# Patient Record
Sex: Female | Born: 1980 | Race: Black or African American | Hispanic: No | Marital: Married | Smoking: Never smoker
Health system: Southern US, Community
[De-identification: ages and names within clinical notes are randomized; demographics above are authoritative.]

## PROBLEM LIST (undated history)

## (undated) DIAGNOSIS — K5010 Crohn's disease of large intestine without complications: Secondary | ICD-10-CM

## (undated) DIAGNOSIS — G43909 Migraine, unspecified, not intractable, without status migrainosus: Secondary | ICD-10-CM

## (undated) DIAGNOSIS — D500 Iron deficiency anemia secondary to blood loss (chronic): Secondary | ICD-10-CM

## (undated) DIAGNOSIS — R7303 Prediabetes: Secondary | ICD-10-CM

## (undated) DIAGNOSIS — F419 Anxiety disorder, unspecified: Secondary | ICD-10-CM

## (undated) DIAGNOSIS — K529 Noninfective gastroenteritis and colitis, unspecified: Secondary | ICD-10-CM

## (undated) DIAGNOSIS — A498 Other bacterial infections of unspecified site: Secondary | ICD-10-CM

## (undated) DIAGNOSIS — M797 Fibromyalgia: Secondary | ICD-10-CM

## (undated) DIAGNOSIS — K219 Gastro-esophageal reflux disease without esophagitis: Secondary | ICD-10-CM

## (undated) DIAGNOSIS — J189 Pneumonia, unspecified organism: Secondary | ICD-10-CM

## (undated) DIAGNOSIS — G932 Benign intracranial hypertension: Secondary | ICD-10-CM

## (undated) HISTORY — DX: Other bacterial infections of unspecified site: A49.8

## (undated) HISTORY — DX: Anxiety disorder, unspecified: F41.9

## (undated) HISTORY — PX: EXCISIONAL HEMORRHOIDECTOMY: SHX1541

## (undated) HISTORY — DX: Crohn's disease of large intestine without complications: K50.10

## (undated) HISTORY — PX: LUMBAR PUNCTURE: SHX1985

## (undated) HISTORY — DX: Iron deficiency anemia secondary to blood loss (chronic): D50.0

## (undated) HISTORY — DX: Noninfective gastroenteritis and colitis, unspecified: K52.9

## (undated) HISTORY — DX: Prediabetes: R73.03

---

## 2000-12-15 ENCOUNTER — Emergency Department (HOSPITAL_COMMUNITY): Admission: EM | Admit: 2000-12-15 | Discharge: 2000-12-15 | Payer: Self-pay | Admitting: Emergency Medicine

## 2001-10-26 ENCOUNTER — Emergency Department (HOSPITAL_COMMUNITY): Admission: EM | Admit: 2001-10-26 | Discharge: 2001-10-26 | Payer: Self-pay | Admitting: Emergency Medicine

## 2009-12-01 ENCOUNTER — Emergency Department (HOSPITAL_BASED_OUTPATIENT_CLINIC_OR_DEPARTMENT_OTHER): Admission: EM | Admit: 2009-12-01 | Discharge: 2009-12-02 | Payer: Self-pay | Admitting: Emergency Medicine

## 2009-12-20 ENCOUNTER — Emergency Department (HOSPITAL_BASED_OUTPATIENT_CLINIC_OR_DEPARTMENT_OTHER): Admission: EM | Admit: 2009-12-20 | Discharge: 2009-12-20 | Payer: Self-pay | Admitting: Emergency Medicine

## 2010-01-06 ENCOUNTER — Emergency Department (HOSPITAL_BASED_OUTPATIENT_CLINIC_OR_DEPARTMENT_OTHER): Admission: EM | Admit: 2010-01-06 | Discharge: 2010-01-06 | Payer: Self-pay | Admitting: Emergency Medicine

## 2010-01-08 ENCOUNTER — Emergency Department (HOSPITAL_BASED_OUTPATIENT_CLINIC_OR_DEPARTMENT_OTHER): Admission: EM | Admit: 2010-01-08 | Discharge: 2010-01-08 | Payer: Self-pay | Admitting: Emergency Medicine

## 2010-03-18 ENCOUNTER — Emergency Department (HOSPITAL_BASED_OUTPATIENT_CLINIC_OR_DEPARTMENT_OTHER): Admission: EM | Admit: 2010-03-18 | Discharge: 2010-03-18 | Payer: Self-pay | Admitting: Emergency Medicine

## 2010-06-10 ENCOUNTER — Emergency Department (HOSPITAL_BASED_OUTPATIENT_CLINIC_OR_DEPARTMENT_OTHER): Admission: EM | Admit: 2010-06-10 | Discharge: 2010-06-10 | Payer: Self-pay | Admitting: Emergency Medicine

## 2010-09-27 ENCOUNTER — Emergency Department (HOSPITAL_BASED_OUTPATIENT_CLINIC_OR_DEPARTMENT_OTHER): Admission: EM | Admit: 2010-09-27 | Discharge: 2010-09-27 | Payer: Self-pay | Admitting: Emergency Medicine

## 2010-10-25 ENCOUNTER — Emergency Department (HOSPITAL_BASED_OUTPATIENT_CLINIC_OR_DEPARTMENT_OTHER): Admission: EM | Admit: 2010-10-25 | Discharge: 2009-12-04 | Payer: Self-pay | Admitting: Emergency Medicine

## 2011-01-29 LAB — URINALYSIS, ROUTINE W REFLEX MICROSCOPIC
Bilirubin Urine: NEGATIVE
Ketones, ur: NEGATIVE mg/dL
Leukocytes, UA: NEGATIVE
Nitrite: NEGATIVE
Protein, ur: NEGATIVE mg/dL
Urobilinogen, UA: 1 mg/dL (ref 0.0–1.0)
pH: 6 (ref 5.0–8.0)

## 2011-01-29 LAB — PREGNANCY, URINE: Preg Test, Ur: NEGATIVE

## 2011-01-31 ENCOUNTER — Emergency Department (INDEPENDENT_AMBULATORY_CARE_PROVIDER_SITE_OTHER): Payer: Self-pay

## 2011-01-31 ENCOUNTER — Emergency Department (HOSPITAL_BASED_OUTPATIENT_CLINIC_OR_DEPARTMENT_OTHER)
Admission: EM | Admit: 2011-01-31 | Discharge: 2011-01-31 | Disposition: A | Discharge status: Home / Self Care | Payer: Self-pay | Attending: Emergency Medicine | Admitting: Emergency Medicine

## 2011-01-31 DIAGNOSIS — R209 Unspecified disturbances of skin sensation: Secondary | ICD-10-CM

## 2011-01-31 DIAGNOSIS — J329 Chronic sinusitis, unspecified: Secondary | ICD-10-CM | POA: Insufficient documentation

## 2011-01-31 DIAGNOSIS — G5790 Unspecified mononeuropathy of unspecified lower limb: Secondary | ICD-10-CM | POA: Insufficient documentation

## 2011-01-31 DIAGNOSIS — D649 Anemia, unspecified: Secondary | ICD-10-CM | POA: Insufficient documentation

## 2011-01-31 LAB — URINALYSIS, ROUTINE W REFLEX MICROSCOPIC
Bilirubin Urine: NEGATIVE
Glucose, UA: NEGATIVE mg/dL
Ketones, ur: NEGATIVE mg/dL
Nitrite: NEGATIVE
Protein, ur: NEGATIVE mg/dL
pH: 7 (ref 5.0–8.0)

## 2011-01-31 LAB — DIFFERENTIAL
Basophils Relative: 0 % (ref 0–1)
Eosinophils Absolute: 0.3 10*3/uL (ref 0.0–0.7)
Eosinophils Relative: 3 % (ref 0–5)
Lymphs Abs: 1.9 10*3/uL (ref 0.7–4.0)
Monocytes Absolute: 0.5 10*3/uL (ref 0.1–1.0)
Monocytes Relative: 6 % (ref 3–12)
Neutrophils Relative %: 69 % (ref 43–77)

## 2011-01-31 LAB — BASIC METABOLIC PANEL
BUN: 8 mg/dL (ref 6–23)
Calcium: 9.2 mg/dL (ref 8.4–10.5)
Chloride: 112 mEq/L (ref 96–112)
Creatinine, Ser: 0.9 mg/dL (ref 0.4–1.2)
GFR calc Af Amer: >60 mL/min (ref >60)

## 2011-01-31 LAB — CBC
MCH: 24.8 pg — ABNORMAL LOW (ref 26.0–34.0)
MCHC: 33.1 g/dL (ref 30.0–36.0)
MCV: 74.8 fL — ABNORMAL LOW (ref 78.0–100.0)
Platelets: 297 10*3/uL (ref 150–400)
RBC: 4.48 MIL/uL (ref 3.87–5.11)

## 2011-01-31 LAB — PREGNANCY, URINE: Preg Test, Ur: NEGATIVE

## 2011-02-03 LAB — PREGNANCY, URINE: Preg Test, Ur: NEGATIVE

## 2011-04-11 ENCOUNTER — Emergency Department (HOSPITAL_BASED_OUTPATIENT_CLINIC_OR_DEPARTMENT_OTHER)
Admission: EM | Admit: 2011-04-11 | Discharge: 2011-04-11 | Disposition: A | Discharge status: Home / Self Care | Payer: Self-pay | Attending: Emergency Medicine | Admitting: Emergency Medicine

## 2011-04-11 DIAGNOSIS — G5790 Unspecified mononeuropathy of unspecified lower limb: Secondary | ICD-10-CM | POA: Insufficient documentation

## 2015-08-16 DIAGNOSIS — R531 Weakness: Secondary | ICD-10-CM | POA: Insufficient documentation

## 2015-08-16 DIAGNOSIS — M79601 Pain in right arm: Secondary | ICD-10-CM | POA: Insufficient documentation

## 2015-08-16 DIAGNOSIS — R200 Anesthesia of skin: Secondary | ICD-10-CM | POA: Insufficient documentation

## 2015-08-16 DIAGNOSIS — G894 Chronic pain syndrome: Secondary | ICD-10-CM | POA: Insufficient documentation

## 2015-09-13 DIAGNOSIS — R209 Unspecified disturbances of skin sensation: Secondary | ICD-10-CM | POA: Insufficient documentation

## 2015-10-05 DIAGNOSIS — H9319 Tinnitus, unspecified ear: Secondary | ICD-10-CM | POA: Insufficient documentation

## 2015-10-05 DIAGNOSIS — G932 Benign intracranial hypertension: Secondary | ICD-10-CM | POA: Insufficient documentation

## 2016-02-27 DIAGNOSIS — G501 Atypical facial pain: Secondary | ICD-10-CM | POA: Insufficient documentation

## 2016-02-27 DIAGNOSIS — M5481 Occipital neuralgia: Secondary | ICD-10-CM | POA: Insufficient documentation

## 2016-02-27 DIAGNOSIS — M25559 Pain in unspecified hip: Secondary | ICD-10-CM | POA: Insufficient documentation

## 2016-02-27 DIAGNOSIS — M461 Sacroiliitis, not elsewhere classified: Secondary | ICD-10-CM | POA: Insufficient documentation

## 2016-02-27 DIAGNOSIS — M25549 Pain in joints of unspecified hand: Secondary | ICD-10-CM | POA: Insufficient documentation

## 2016-02-27 DIAGNOSIS — M62838 Other muscle spasm: Secondary | ICD-10-CM | POA: Insufficient documentation

## 2016-02-27 DIAGNOSIS — M5417 Radiculopathy, lumbosacral region: Secondary | ICD-10-CM | POA: Insufficient documentation

## 2016-04-04 DIAGNOSIS — F1120 Opioid dependence, uncomplicated: Secondary | ICD-10-CM | POA: Insufficient documentation

## 2016-07-25 DIAGNOSIS — G4710 Hypersomnia, unspecified: Secondary | ICD-10-CM | POA: Insufficient documentation

## 2016-08-24 DIAGNOSIS — R0683 Snoring: Secondary | ICD-10-CM | POA: Insufficient documentation

## 2016-10-23 DIAGNOSIS — G250 Essential tremor: Secondary | ICD-10-CM | POA: Insufficient documentation

## 2018-01-07 ENCOUNTER — Other Ambulatory Visit (HOSPITAL_COMMUNITY): Payer: Self-pay | Admitting: Physician Assistant

## 2018-01-07 DIAGNOSIS — M549 Dorsalgia, unspecified: Principal | ICD-10-CM

## 2018-01-07 DIAGNOSIS — G8929 Other chronic pain: Secondary | ICD-10-CM

## 2018-01-10 ENCOUNTER — Ambulatory Visit (HOSPITAL_COMMUNITY)
Admission: RE | Admit: 2018-01-10 | Discharge: 2018-01-10 | Disposition: A | Discharge status: Home / Self Care | Payer: BLUE CROSS/BLUE SHIELD | Source: Ambulatory Visit | Attending: Physician Assistant | Admitting: Physician Assistant

## 2018-01-10 ENCOUNTER — Encounter (HOSPITAL_COMMUNITY): Payer: Self-pay

## 2018-01-18 ENCOUNTER — Emergency Department (HOSPITAL_BASED_OUTPATIENT_CLINIC_OR_DEPARTMENT_OTHER): Payer: BLUE CROSS/BLUE SHIELD

## 2018-01-18 ENCOUNTER — Emergency Department (HOSPITAL_BASED_OUTPATIENT_CLINIC_OR_DEPARTMENT_OTHER)
Admission: EM | Admit: 2018-01-18 | Discharge: 2018-01-19 | Disposition: A | Discharge status: Home / Self Care | Payer: BLUE CROSS/BLUE SHIELD | Attending: Emergency Medicine | Admitting: Emergency Medicine

## 2018-01-18 ENCOUNTER — Other Ambulatory Visit: Payer: Self-pay

## 2018-01-18 ENCOUNTER — Encounter (HOSPITAL_BASED_OUTPATIENT_CLINIC_OR_DEPARTMENT_OTHER): Payer: Self-pay | Admitting: Emergency Medicine

## 2018-01-18 DIAGNOSIS — Y92330 Ice skating rink (indoor) (outdoor) as the place of occurrence of the external cause: Secondary | ICD-10-CM | POA: Insufficient documentation

## 2018-01-18 DIAGNOSIS — Y9301 Activity, walking, marching and hiking: Secondary | ICD-10-CM | POA: Insufficient documentation

## 2018-01-18 DIAGNOSIS — R51 Headache: Secondary | ICD-10-CM | POA: Insufficient documentation

## 2018-01-18 DIAGNOSIS — Z79899 Other long term (current) drug therapy: Secondary | ICD-10-CM | POA: Insufficient documentation

## 2018-01-18 DIAGNOSIS — Y998 Other external cause status: Secondary | ICD-10-CM | POA: Insufficient documentation

## 2018-01-18 DIAGNOSIS — M549 Dorsalgia, unspecified: Secondary | ICD-10-CM | POA: Diagnosis not present

## 2018-01-18 DIAGNOSIS — M791 Myalgia, unspecified site: Secondary | ICD-10-CM

## 2018-01-18 DIAGNOSIS — S3992XA Unspecified injury of lower back, initial encounter: Secondary | ICD-10-CM | POA: Diagnosis present

## 2018-01-18 DIAGNOSIS — W19XXXA Unspecified fall, initial encounter: Secondary | ICD-10-CM

## 2018-01-18 HISTORY — DX: Fibromyalgia: M79.7

## 2018-01-18 HISTORY — DX: Gastro-esophageal reflux disease without esophagitis: K21.9

## 2018-01-18 HISTORY — DX: Migraine, unspecified, not intractable, without status migrainosus: G43.909

## 2018-01-18 LAB — PREGNANCY, URINE: Preg Test, Ur: NEGATIVE

## 2018-01-18 MED ORDER — ACETAMINOPHEN 325 MG PO TABS
650.0000 mg | ORAL_TABLET | Freq: Once | ORAL | Status: AC
  Administered 2018-01-18: 650 mg via ORAL
  Filled 2018-01-18: qty 2

## 2018-01-18 MED ORDER — OXYCODONE HCL 5 MG PO TABS
5.0000 mg | ORAL_TABLET | Freq: Once | ORAL | Status: AC
  Administered 2018-01-19: 5 mg via ORAL
  Filled 2018-01-18: qty 1

## 2018-01-18 MED ORDER — IBUPROFEN 400 MG PO TABS
ORAL_TABLET | ORAL | Status: AC
  Filled 2018-01-18: qty 1

## 2018-01-18 MED ORDER — IBUPROFEN 400 MG PO TABS
400.0000 mg | ORAL_TABLET | Freq: Once | ORAL | Status: AC | PRN
  Administered 2018-01-18: 400 mg via ORAL

## 2018-01-18 NOTE — ED Notes (Signed)
ED Provider at bedside. 

## 2018-01-18 NOTE — ED Provider Notes (Signed)
[facility] EMERGENCY DEPARTMENT Provider Note   CSN: [id number] Arrival date & time: 01/18/18  [id number]     History   Chief Complaint Chief Complaint  Patient presents with  . Fall    HPI Nancy Reeves is a 37 y.o. female.  The history is provided by the patient and medical records. No language interpreter was used.  Fall  Associated symptoms include headaches.   Nancy Reeves is a 37 y.o. female  with a PMH of fibromyalgia who presents to the Emergency Department for evaluation after fall around 4PM today. Patient was at the ice rink and walking on carpet floor with her ice skates on when she lost her balance and fell backwards. She fell onto back and struck her head. Endorses back pain and headache. No LOC. Not on anti-coagulation. No nausea, vomiting or visual changes. Ibuprofen given in triage which did help a little with pain. No saddle anesthesia, weakness, numbness, urinary complaints including retention/incontinence. Hx of fibromyalgia, but no history of back issues or previous spinal procedures.   Past Medical History:  Diagnosis Date  . Fibromyalgia   . GERD (gastroesophageal reflux disease)   . Migraine    psuedotumor cerebra    There are no active problems to display for this patient.   History reviewed. No pertinent surgical history.  OB History    No data available       Home Medications    Prior to Admission medications   Medication Sig Start Date End Date Taking? Authorizing Provider  DULoxetine (CYMBALTA) 60 MG capsule Take 60 mg by mouth daily.   Yes [provider]  gabapentin (NEURONTIN) 300 MG capsule Take 300 mg by mouth 3 (three) times daily.   Yes [provider]  methocarbamol (ROBAXIN) 500 MG tablet Take 1 tablet (500 mg total) by mouth 2 (two) times daily as needed (muscle soreness). 01/19/18   Ward, Chase PicketJaime Pilcher, PA-C  naproxen (NAPROSYN) 500 MG tablet Take 1 tablet (500 mg total) by mouth 2 (two) times daily as  needed. 01/19/18   Ward, Chase PicketJaime Pilcher, PA-C    Family History History reviewed. No pertinent family history.  Social History Social History   Tobacco Use  . Smoking status: Never Smoker  . Smokeless tobacco: Never Used  Substance Use Topics  . Alcohol use: No    Frequency: Never  . Drug use: No     Allergies   Triptans   Review of Systems Review of Systems  Musculoskeletal: Positive for arthralgias and myalgias.  Skin: Negative for color change and wound.  Neurological: Positive for headaches. Negative for dizziness, syncope, weakness, light-headedness and numbness.  All other systems reviewed and are negative.    Physical Exam Updated Vital Signs BP 125/87 (BP Location: Right Arm)   Pulse 92   Temp 98.1 F (36.7 C) (Oral)   Resp 18   Ht 5\' 4"  (1.626 m)   Wt 95.7 kg (211 lb)   LMP  (LMP Unknown)   SpO2 100%   BMI 36.22 kg/m   Physical Exam  Constitutional: She is oriented to person, place, and time. She appears well-developed and well-nourished. No distress.  HENT:  Head: Normocephalic and atraumatic.  Cardiovascular: Normal rate, regular rhythm and normal heart sounds.  No murmur heard. Pulmonary/Chest: Effort normal and breath sounds normal. No respiratory distress.  Abdominal: Soft. She exhibits no distension. There is no tenderness.  Musculoskeletal:  Diffuse tenderness along entire back, central and midline C/T/L spine. Full ROM and ambulatory.  Negative straight leg raises bilaterally. She also has tenderness to the right hip without overlying skin changes. Full ROM of the hip, however pain reproducible with internal or external rotation.  Neurological: She is alert and oriented to person, place, and time.  Alert, oriented, thought content appropriate, able to give a coherent history. Speech is clear and goal oriented, able to follow commands.  Cranial Nerves:  II:  Peripheral visual fields grossly normal, pupils equal, round, reactive to light III, IV,  VI: EOM intact bilaterally, ptosis not present V,VII: smile symmetric, eyes kept closed tightly against resistance, facial light touch sensation equal VIII: hearing grossly normal IX, X: symmetric soft palate movement, uvula elevates symmetrically  XI: bilateral shoulder shrug symmetric and strong XII: midline tongue extension 5/5 muscle strength in upper and lower extremities bilaterally including strong and equal grip strength and dorsiflexion/plantar flexion Sensory to light touch normal in all four extremities.  Normal finger-to-nose and rapid alternating movements; normal gait and balance. Negative romberg, no pronator drift.  Skin: Skin is warm and dry.  Nursing note and vitals reviewed.    ED Treatments / Results  Labs (all labs ordered are listed, but only abnormal results are displayed) Labs Reviewed  PREGNANCY, URINE    EKG  EKG Interpretation None       Radiology No results found.  Procedures Procedures (including critical care time)  Medications Ordered in ED Medications  ibuprofen (ADVIL,MOTRIN) tablet 400 mg (400 mg Oral Given 01/18/18 2038)  oxyCODONE (Oxy IR/ROXICODONE) immediate release tablet 5 mg (5 mg Oral Given 01/19/18 0032)  acetaminophen (TYLENOL) tablet 650 mg (650 mg Oral Given 01/18/18 2313)     Initial Impression / Assessment and Plan / ED Course  I have reviewed the triage vital signs and the nursing notes.  Pertinent labs & imaging results that were available during my care of the patient were reviewed by me and considered in my medical decision making (see chart for details).    Nancy Reeves is a 37 y.o. female who presents to ED for evaluation after falling down while ice skating at 4PM today. She did hit head. No focal neuro deficits on exam. Not on anti-coagulation. No LOC, n/v. 0 on [place] CT rule. Do not feel imaging is warranted at this time. She has diffuse tenderness to entire back. More-so to the musculature than midline. Will  obtain plain films for further evaluation. If negative, will treat symptomatically.   X-ray pending at shift change. Care assumed by oncoming provider Dr. Wilkie Aye. Case discussed, plan agreed upon. Will follow up on pending imaging, if negative, symptomatic home care and PCP follow up.    Final Clinical Impressions(s) / ED Diagnoses   Final diagnoses:  Fall, initial encounter  Acute bilateral back pain, unspecified back location  Muscle soreness    ED Discharge Orders        Ordered    naproxen (NAPROSYN) 500 MG tablet  2 times daily PRN     01/19/18 0043    methocarbamol (ROBAXIN) 500 MG tablet  2 times daily PRN     01/19/18 0043       Ward, Chase Picket, PA-C 01/19/18 0057    Tilden Fossa, MD 01/19/18 [phone]

## 2018-01-18 NOTE — ED Triage Notes (Signed)
Patient states that she fell backwards onto her head around 430. The patient denies any LOC

## 2018-01-19 MED ORDER — METHOCARBAMOL 500 MG PO TABS: 500.0000 mg | ORAL_TABLET | Freq: Two times a day (BID) | ORAL | 0 refills | Status: DC | PRN

## 2018-01-19 MED ORDER — NAPROXEN 500 MG PO TABS: 500.0000 mg | ORAL_TABLET | Freq: Two times a day (BID) | ORAL | 0 refills | Status: DC | PRN

## 2018-01-19 NOTE — Discharge Instructions (Signed)
It was my pleasure taking care of you today!   Fortunately, your x-rays were very reassuring and did not show any injury from your fall today.  Robaxin is your muscle relaxer to take as needed. Naproxen as needed for pain. In addition to this, ice or heat to affected area can help with pain relief.   Please follow up with your primary care doctor if symptoms are not improving in the next 2-3 days.   Return to ER for new or worsening symptoms, any additional concerns.   COLD THERAPY DIRECTIONS:  Ice or gel packs can be used to reduce both pain and swelling. Ice is the most helpful within the first 24 to 48 hours after an injury or flareup from overusing a muscle or joint.  Ice is effective, has very few side effects, and is safe for most people to use.   If you expose your skin to cold temperatures for too long or without the proper protection, you can damage your skin or nerves. Watch for signs of skin damage due to cold.   HOME CARE INSTRUCTIONS  Follow these tips to use ice and cold packs safely.  Place a dry or damp towel between the ice and skin. A damp towel will cool the skin more quickly, so you may need to shorten the time that the ice is used.  For a more rapid response, add gentle compression to the ice.  Ice for no more than 10 to 20 minutes at a time. The bonier the area you are icing, the less time it will take to get the benefits of ice.  Check your skin after 5 minutes to make sure there are no signs of a poor response to cold or skin damage.  Rest 20 minutes or more in between uses.  Once your skin is numb, you can end your treatment. You can test numbness by very lightly touching your skin. The touch should be so light that you do not see the skin dimple from the pressure of your fingertip. When using ice, most people will feel these normal sensations in this order: cold, burning, aching, and numbness.

## 2018-08-28 ENCOUNTER — Other Ambulatory Visit: Payer: Self-pay

## 2018-08-28 ENCOUNTER — Emergency Department (HOSPITAL_BASED_OUTPATIENT_CLINIC_OR_DEPARTMENT_OTHER)
Admission: EM | Admit: 2018-08-28 | Discharge: 2018-08-29 | Disposition: A | Discharge status: Home / Self Care | Payer: BLUE CROSS/BLUE SHIELD | Attending: Emergency Medicine | Admitting: Emergency Medicine

## 2018-08-28 ENCOUNTER — Encounter (HOSPITAL_BASED_OUTPATIENT_CLINIC_OR_DEPARTMENT_OTHER): Payer: Self-pay

## 2018-08-28 DIAGNOSIS — M546 Pain in thoracic spine: Secondary | ICD-10-CM | POA: Insufficient documentation

## 2018-08-28 DIAGNOSIS — Z79899 Other long term (current) drug therapy: Secondary | ICD-10-CM | POA: Diagnosis not present

## 2018-08-28 DIAGNOSIS — G8929 Other chronic pain: Secondary | ICD-10-CM | POA: Diagnosis not present

## 2018-08-28 LAB — URINALYSIS, ROUTINE W REFLEX MICROSCOPIC
Bilirubin Urine: NEGATIVE
GLUCOSE, UA: NEGATIVE mg/dL
Ketones, ur: NEGATIVE mg/dL
Leukocytes, UA: NEGATIVE
Nitrite: NEGATIVE
PROTEIN: NEGATIVE mg/dL
Specific Gravity, Urine: >1.03 — ABNORMAL HIGH (ref 1.005–1.030)
pH: 6 (ref 5.0–8.0)

## 2018-08-28 LAB — URINALYSIS, MICROSCOPIC (REFLEX): WBC UA: NONE SEEN WBC/hpf (ref 0–5)

## 2018-08-28 LAB — PREGNANCY, URINE: PREG TEST UR: NEGATIVE

## 2018-08-28 MED ORDER — METHOCARBAMOL 500 MG PO TABS
1000.0000 mg | ORAL_TABLET | Freq: Once | ORAL | Status: AC
  Administered 2018-08-29: 1000 mg via ORAL
  Filled 2018-08-28: qty 2

## 2018-08-28 NOTE — ED Triage Notes (Signed)
C/o mid back spasms-was seen by chiropractor today and twice/week for same-NAD-steady gait

## 2018-08-28 NOTE — ED Notes (Signed)
ED Provider at bedside. 

## 2018-08-29 MED ORDER — METHOCARBAMOL 500 MG PO TABS: 500.0000 mg | ORAL_TABLET | Freq: Two times a day (BID) | ORAL | 0 refills | Status: DC | PRN

## 2018-08-29 NOTE — ED Provider Notes (Signed)
[facility] EMERGENCY DEPARTMENT Provider Note   CSN: [id number] Arrival date & time: 08/28/18  2136     History   Chief Complaint Chief Complaint  Patient presents with  . Back Pain    HPI Nancy Reeves is a 37 y.o. female.  The history is provided by the patient. No language interpreter was used.   Nancy Reeves is a 37 y.o. female who presents to the Emergency Department complaining of back pain. She has a history of fibromyalgia and [place] cerebral. She has been treating herself at home with occasional ibuprofen and BC powders. She presents for evaluation of acute on chronic thoracic back pain. Pain is been present for several years but worsening today. It is described as a squeezing sensation along her rib margin bilaterally and in her back. She denies any fevers, nausea, vomiting, shortness of breath, abdominal pain, leg swelling. She has seen her chiropractor four times for the symptoms with no significant improvement.  Past Medical History:  Diagnosis Date  . Fibromyalgia   . GERD (gastroesophageal reflux disease)   . Migraine    psuedotumor cerebra    There are no active problems to display for this patient.   History reviewed. No pertinent surgical history.   OB History   None      Home Medications    Prior to Admission medications   Medication Sig Start Date End Date Taking? Authorizing Provider  DULoxetine (CYMBALTA) 60 MG capsule Take 60 mg by mouth daily.    [provider]  gabapentin (NEURONTIN) 300 MG capsule Take 300 mg by mouth 3 (three) times daily.    [provider]  methocarbamol (ROBAXIN) 500 MG tablet Take 1-2 tablets (500-1,000 mg total) by mouth 2 (two) times daily as needed for muscle spasms. 08/29/18   Tilden Fossa, MD  naproxen (NAPROSYN) 500 MG tablet Take 1 tablet (500 mg total) by mouth 2 (two) times daily as needed. 01/19/18   Ward, Chase Picket, PA-C    Family History No family history on  file.  Social History Social History   Tobacco Use  . Smoking status: Never Smoker  . Smokeless tobacco: Never Used  Substance Use Topics  . Alcohol use: No    Frequency: Never  . Drug use: No     Allergies   Triptans   Review of Systems Review of Systems  All other systems reviewed and are negative.    Physical Exam Updated Vital Signs BP (!) 144/96 (BP Location: Left Arm)   Pulse (!) 104   Temp 98.5 F (36.9 C) (Oral)   Resp 20   Ht 5\' 4"  (1.626 m)   Wt 107 kg   LMP 08/07/2018 (Approximate)   SpO2 100%   BMI 40.51 kg/m   Physical Exam  Constitutional: She is oriented to person, place, and time. She appears well-developed and well-nourished.  HENT:  Head: Normocephalic and atraumatic.  Cardiovascular: Normal rate and regular rhythm.  No murmur heard. Pulmonary/Chest: Effort normal and breath sounds normal. No respiratory distress.  Pendulous breasts  Abdominal: Soft. There is no rebound and no guarding.  Mild generalized abdominal tenderness  Musculoskeletal: She exhibits no edema.  Tenderness to palpation throughout the thoracic back, midline and in the scapular region. There is also tenderness along the axillary line bilaterally.  Neurological: She is alert and oriented to person, place, and time.  Five out of five strength in all four extremities with sensation to light touch intact in all four extremities  Skin: Skin is warm and dry.  Psychiatric: She has a normal mood and affect. Her behavior is normal.  Nursing note and vitals reviewed.    ED Treatments / Results  Labs (all labs ordered are listed, but only abnormal results are displayed) Labs Reviewed  URINALYSIS, ROUTINE W REFLEX MICROSCOPIC - Abnormal; Notable for the following components:      Result Value   APPearance CLOUDY (*)    Specific Gravity, Urine >1.030 (*)    Hgb urine dipstick TRACE (*)    All other components within normal limits  URINALYSIS, MICROSCOPIC (REFLEX) - Abnormal;  Notable for the following components:   Bacteria, UA RARE (*)    All other components within normal limits  PREGNANCY, URINE    EKG None  Radiology No results found.  Procedures Procedures (including critical care time)  Medications Ordered in ED Medications  methocarbamol (ROBAXIN) tablet 1,000 mg (has no administration in time range)     Initial Impression / Assessment and Plan / ED Course  I have reviewed the triage vital signs and the nursing notes.  Pertinent labs & imaging results that were available during my care of the patient were reviewed by me and considered in my medical decision making (see chart for details).     Patient here for evaluation of acute on chronic thoracic back pain. She is well appearing on examination with diffuse tenderness to palpation. Presentation is not consistent with PE, pneumonia, acute abdomen. Discussed with patient home care for musculoskeletal pain, fibromyalgia. Discussed that pendulous breasts and poorly fitting bra may be contributing to current sxs.  Discussed continuing nsaids, will add robaxin. Discussed pcp follow up and return precautions.    Final Clinical Impressions(s) / ED Diagnoses   Final diagnoses:  Chronic bilateral thoracic back pain    ED Discharge Orders         Ordered    methocarbamol (ROBAXIN) 500 MG tablet  2 times daily PRN     08/29/18 0002           Tilden Fossa, MD 08/29/18 0109

## 2019-02-11 ENCOUNTER — Other Ambulatory Visit: Payer: Self-pay

## 2019-02-11 ENCOUNTER — Emergency Department (HOSPITAL_BASED_OUTPATIENT_CLINIC_OR_DEPARTMENT_OTHER)
Admission: EM | Admit: 2019-02-11 | Discharge: 2019-02-11 | Disposition: A | Discharge status: Home / Self Care | Payer: BLUE CROSS/BLUE SHIELD | Attending: Emergency Medicine | Admitting: Emergency Medicine

## 2019-02-11 ENCOUNTER — Encounter (HOSPITAL_BASED_OUTPATIENT_CLINIC_OR_DEPARTMENT_OTHER): Payer: Self-pay | Admitting: Emergency Medicine

## 2019-02-11 DIAGNOSIS — Z79899 Other long term (current) drug therapy: Secondary | ICD-10-CM | POA: Diagnosis not present

## 2019-02-11 DIAGNOSIS — M436 Torticollis: Secondary | ICD-10-CM | POA: Diagnosis not present

## 2019-02-11 DIAGNOSIS — M542 Cervicalgia: Secondary | ICD-10-CM | POA: Diagnosis present

## 2019-02-11 MED ORDER — MELOXICAM 7.5 MG PO TABS
7.5000 mg | ORAL_TABLET | Freq: Every day | ORAL | Status: DC
  Administered 2019-02-11: 7.5 mg via ORAL
  Filled 2019-02-11: qty 1

## 2019-02-11 MED ORDER — ACETAMINOPHEN 500 MG PO TABS
1000.0000 mg | ORAL_TABLET | Freq: Once | ORAL | Status: AC
  Administered 2019-02-11: 1000 mg via ORAL
  Filled 2019-02-11: qty 2

## 2019-02-11 MED ORDER — METHOCARBAMOL 500 MG PO TABS: 500.0000 mg | ORAL_TABLET | Freq: Two times a day (BID) | ORAL | 0 refills | Status: DC | PRN

## 2019-02-11 MED ORDER — DEXAMETHASONE SODIUM PHOSPHATE 10 MG/ML IJ SOLN
10.0000 mg | Freq: Once | INTRAMUSCULAR | Status: AC
  Administered 2019-02-11: 10 mg via INTRAMUSCULAR
  Filled 2019-02-11: qty 1

## 2019-02-11 MED ORDER — MELOXICAM 15 MG PO TABS: 15.0000 mg | ORAL_TABLET | Freq: Every day | ORAL | 0 refills | Status: DC

## 2019-02-11 MED ORDER — METHOCARBAMOL 500 MG PO TABS
1000.0000 mg | ORAL_TABLET | Freq: Once | ORAL | Status: AC
  Administered 2019-02-11: 1000 mg via ORAL
  Filled 2019-02-11: qty 2

## 2019-02-11 NOTE — ED Provider Notes (Signed)
[facility] EMERGENCY DEPARTMENT Provider Note   CSN: [id number] Arrival date & time: 02/11/19  0141    History   Chief Complaint Chief Complaint  Patient presents with  . Neck Pain    HPI Nancy Reeves is a 38 y.o. female.     The history is provided by the patient.  Neck Pain  Pain location:  R side Quality:  Aching Pain radiates to:  Does not radiate Pain is:  Same all the time Onset quality:  Sudden Duration:  1 day Timing:  Constant Progression:  Unchanged Chronicity:  Recurrent Context comment:  Woke up from sleeping funny Relieved by:  Nothing Worsened by:  Nothing Ineffective treatments:  NSAIDs Associated symptoms: no bladder incontinence, no bowel incontinence, no chest pain, no fever, no headaches, no leg pain, no numbness, no paresis, no photophobia, no syncope, no tingling, no visual change, no weakness and no weight loss   Risk factors: no hx of head and neck radiation and no hx of osteoporosis     Past Medical History:  Diagnosis Date  . Fibromyalgia   . GERD (gastroesophageal reflux disease)   . Migraine    psuedotumor cerebra    There are no active problems to display for this patient.   History reviewed. No pertinent surgical history.   OB History   No obstetric history on file.      Home Medications    Prior to Admission medications   Medication Sig Start Date End Date Taking? Authorizing Provider  tiZANidine (ZANAFLEX) 4 MG capsule Take by mouth. 01/30/19 03/01/19 Yes [provider]  DULoxetine (CYMBALTA) 60 MG capsule Take 60 mg by mouth daily.    [provider]  gabapentin (NEURONTIN) 300 MG capsule Take 300 mg by mouth 3 (three) times daily.    [provider]  methocarbamol (ROBAXIN) 500 MG tablet Take 1-2 tablets (500-1,000 mg total) by mouth 2 (two) times daily as needed for muscle spasms. 08/29/18   Tilden Fossa, MD  naproxen (NAPROSYN) 500 MG tablet Take 1 tablet (500 mg total) by  mouth 2 (two) times daily as needed. 01/19/18   Ward, Chase Picket, PA-C    Family History History reviewed. No pertinent family history.  Social History Social History   Tobacco Use  . Smoking status: Never Smoker  . Smokeless tobacco: Never Used  Substance Use Topics  . Alcohol use: No    Frequency: Never  . Drug use: No     Allergies   Triptans   Review of Systems Review of Systems  Constitutional: Negative for diaphoresis, fever and weight loss.  HENT: Negative for sore throat, trouble swallowing and voice change.   Eyes: Negative for photophobia.  Respiratory: Negative for chest tightness and shortness of breath.   Cardiovascular: Negative for chest pain, palpitations, leg swelling and syncope.  Gastrointestinal: Negative for bowel incontinence.  Genitourinary: Negative for bladder incontinence.  Musculoskeletal: Positive for neck pain. Negative for back pain, gait problem, joint swelling, myalgias and neck stiffness.  Neurological: Negative for tingling, facial asymmetry, speech difficulty, weakness, numbness and headaches.  All other systems reviewed and are negative.    Physical Exam Updated Vital Signs BP 129/87 (BP Location: Right Arm)   Pulse (!) 106   Temp 98.4 F (36.9 C) (Oral)   Resp 18   Ht 5\' 4"  (1.626 m)   Wt 104.3 kg   LMP 02/05/2019 (Within Days)   SpO2 98%   BMI 39.48 kg/m   Physical Exam Vitals  signs and nursing note reviewed.  Constitutional:      General: She is not in acute distress.    Appearance: Normal appearance. She is normal weight.  HENT:     Head: Normocephalic and atraumatic.     Nose: Nose normal.  Eyes:     Conjunctiva/sclera: Conjunctivae normal.     Pupils: Pupils are equal, round, and reactive to light.  Neck:     Musculoskeletal: Normal range of motion and neck supple. Torticollis present. No neck rigidity or crepitus.     Vascular: No carotid bruit.  Cardiovascular:     Rate and Rhythm: Normal rate and regular  rhythm.     Pulses: Normal pulses.     Heart sounds: Normal heart sounds.  Pulmonary:     Effort: Pulmonary effort is normal. No respiratory distress.     Breath sounds: Normal breath sounds. No stridor. No wheezing, rhonchi or rales.  Chest:     Chest wall: No tenderness.  Abdominal:     General: Abdomen is flat. Bowel sounds are normal.     Tenderness: There is no abdominal tenderness. There is no guarding or rebound.  Musculoskeletal: Normal range of motion.        General: No tenderness.  Lymphadenopathy:     Cervical: No cervical adenopathy.  Skin:    General: Skin is warm and dry.     Capillary Refill: Capillary refill takes less than 2 seconds.  Neurological:     General: No focal deficit present.     Mental Status: She is alert and oriented to person, place, and time.  Psychiatric:        Mood and Affect: Mood normal.        Behavior: Behavior normal.      ED Treatments / Results  Labs (all labs ordered are listed, but only abnormal results are displayed) Labs Reviewed - No data to display  EKG None  Radiology No results found.  Procedures Procedures (including critical care time)  Medications Ordered in ED Medications  meloxicam (MOBIC) tablet 7.5 mg (7.5 mg Oral Given 02/11/19 0203)  methocarbamol (ROBAXIN) tablet 1,000 mg (1,000 mg Oral Given 02/11/19 0203)  acetaminophen (TYLENOL) tablet 1,000 mg (1,000 mg Oral Given 02/11/19 0203)  dexamethasone (DECADRON) injection 10 mg (10 mg Intramuscular Given 02/11/19 0203)       Final Clinical Impressions(s) / ED Diagnoses   Return for intractable cough, coughing up blood,fevers >100.4 unrelieved by medication, shortness of breath, intractable vomiting, chest pain, shortness of breath, weakness,numbness, changes in speech, facial asymmetry,abdominal pain, passing out,Inability to tolerate liquids or food, cough, altered mental status or any concerns. No signs of systemic illness or infection. The patient  is nontoxic-appearing on exam and vital signs are within normal limits.   I have reviewed the triage vital signs and the nursing notes. Pertinent labs &imaging results that were available during my care of the patient were reviewed by me and considered in my medical decision making (see chart for details).  After history, exam, and medical workup I feel the patient has been appropriately medically screened and is safe for discharge home. Pertinent diagnoses were discussed with the patient. Patient was given return precautions.    Karnell Vanderloop, MD 02/11/19 2863

## 2019-02-11 NOTE — ED Triage Notes (Signed)
Pt c/o left sided neck pain that radiates down her back and into her right hip and leg. States pain started yesterday morning and she thought she had "slept wrong". Denies injury. Took naproxen with no relief.

## 2019-02-11 NOTE — ED Notes (Signed)
Pt walked to checkout window by this RN. 

## 2019-07-10 ENCOUNTER — Emergency Department (HOSPITAL_BASED_OUTPATIENT_CLINIC_OR_DEPARTMENT_OTHER)
Admission: EM | Admit: 2019-07-10 | Discharge: 2019-07-10 | Disposition: A | Discharge status: Home / Self Care | Payer: BC Managed Care – PPO | Attending: Emergency Medicine | Admitting: Emergency Medicine

## 2019-07-10 ENCOUNTER — Other Ambulatory Visit: Payer: Self-pay

## 2019-07-10 ENCOUNTER — Encounter (HOSPITAL_BASED_OUTPATIENT_CLINIC_OR_DEPARTMENT_OTHER): Payer: Self-pay | Admitting: *Deleted

## 2019-07-10 DIAGNOSIS — Z79899 Other long term (current) drug therapy: Secondary | ICD-10-CM | POA: Diagnosis not present

## 2019-07-10 DIAGNOSIS — K0889 Other specified disorders of teeth and supporting structures: Secondary | ICD-10-CM

## 2019-07-10 DIAGNOSIS — G501 Atypical facial pain: Secondary | ICD-10-CM | POA: Diagnosis present

## 2019-07-10 HISTORY — DX: Benign intracranial hypertension: G93.2

## 2019-07-10 MED ORDER — OXYCODONE-ACETAMINOPHEN 5-325 MG PO TABS
1.0000 | ORAL_TABLET | Freq: Once | ORAL | Status: AC
  Administered 2019-07-10: 1 via ORAL
  Filled 2019-07-10: qty 1

## 2019-07-10 MED ORDER — AMOXICILLIN-POT CLAVULANATE 875-125 MG PO TABS: 1.0000 | ORAL_TABLET | Freq: Two times a day (BID) | ORAL | 0 refills | Status: DC

## 2019-07-10 MED ORDER — NAPROXEN 500 MG PO TABS: 500.0000 mg | ORAL_TABLET | Freq: Two times a day (BID) | ORAL | 0 refills | Status: DC

## 2019-07-10 MED ORDER — LIDOCAINE VISCOUS HCL 2 % MT SOLN: 15.0000 mL | Freq: Four times a day (QID) | OROMUCOSAL | 0 refills | Status: DC | PRN

## 2019-07-10 MED ORDER — AMOXICILLIN-POT CLAVULANATE 875-125 MG PO TABS
1.0000 | ORAL_TABLET | Freq: Once | ORAL | Status: AC
  Administered 2019-07-10: 1 via ORAL
  Filled 2019-07-10: qty 1

## 2019-07-10 NOTE — ED Provider Notes (Signed)
[facility] EMERGENCY DEPARTMENT Provider Note   CSN: [id number] Arrival date & time: 07/10/19  1934     History   Chief Complaint Chief Complaint  Patient presents with  . Facial Pain    HPI Nancy Reeves is a 38 y.o. female with a past medical history of fibromyalgia who presents to ED for 2-day history of left-sided facial pain and dental pain.  Describes the pain as sharp, worse with palpation and eating.  States that she had her wisdom tooth in the area chipped off about 1 year ago and has had intermittent similar symptoms since then.  She has not seen a dentist in the past several years and does not have a dentist to follow-up with.  She has tried CBD oil, Orajel and Tylenol with no improvement in her symptoms.  She states that the pain is radiating to her head.  She denies any trouble breathing, trouble swallowing, fever, dental abscess, drainage from the area.     HPI  Past Medical History:  Diagnosis Date  . Fibromyalgia   . GERD (gastroesophageal reflux disease)   . Migraine    psuedotumor cerebra  . Pseudotumor cerebri     There are no active problems to display for this patient.   Past Surgical History:  Procedure Laterality Date  . LUMBAR PUNCTURE       OB History   No obstetric history on file.      Home Medications    Prior to Admission medications   Medication Sig Start Date End Date Taking? Authorizing Provider  amoxicillin-clavulanate (AUGMENTIN) 875-125 MG tablet Take 1 tablet by mouth every 12 (twelve) hours. 07/10/19   Jayli Fogleman, PA-C  DULoxetine (CYMBALTA) 60 MG capsule Take 60 mg by mouth daily.    [provider]  gabapentin (NEURONTIN) 300 MG capsule Take 300 mg by mouth 3 (three) times daily.    [provider]  lidocaine (XYLOCAINE) 2 % solution Use as directed 15 mLs in the mouth or throat every 6 (six) hours as needed for mouth pain. 07/10/19   Ryllie Nieland, PA-C  meloxicam (MOBIC) 15 MG tablet Take 1 tablet  (15 mg total) by mouth daily. 02/11/19   Palumbo, April, MD  methocarbamol (ROBAXIN) 500 MG tablet Take 1-2 tablets (500-1,000 mg total) by mouth 2 (two) times daily as needed for muscle spasms. 02/11/19   Palumbo, April, MD  naproxen (NAPROSYN) 500 MG tablet Take 1 tablet (500 mg total) by mouth 2 (two) times daily. 07/10/19   Dietrich PatesKhatri, Phuoc Huy, PA-C    Family History No family history on file.  Social History Social History   Tobacco Use  . Smoking status: Never Smoker  . Smokeless tobacco: Never Used  Substance Use Topics  . Alcohol use: Not Currently    Frequency: Never    Comment: occasional  . Drug use: No     Allergies   Triptans   Review of Systems Review of Systems  Constitutional: Negative for appetite change, chills and fever.  HENT: Positive for dental problem. Negative for congestion, ear pain, rhinorrhea, sneezing and sore throat.   Eyes: Negative for photophobia and visual disturbance.  Respiratory: Negative for cough, chest tightness, shortness of breath and wheezing.   Cardiovascular: Negative for chest pain and palpitations.  Gastrointestinal: Negative for abdominal pain, blood in stool, constipation, diarrhea, nausea and vomiting.  Genitourinary: Negative for dysuria, hematuria and urgency.  Musculoskeletal: Negative for myalgias.  Skin: Negative for rash.  Neurological: Negative for dizziness, weakness  and light-headedness.     Physical Exam Updated Vital Signs BP (!) 141/100 (BP Location: Left Arm)   Pulse 87   Temp 99.8 F (37.7 C) (Oral)   Resp 16   Ht 5\' 2"  (1.575 m)   Wt 104.3 kg   SpO2 100%   BMI 42.07 kg/m   Physical Exam Vitals signs and nursing note reviewed.  Constitutional:      General: She is not in acute distress.    Appearance: She is well-developed. She is not diaphoretic.  HENT:     Head: Normocephalic and atraumatic.     Mouth/Throat:     Dentition: Abnormal dentition. Dental caries present. No dental tenderness, gingival  swelling or dental abscesses.      Comments: Tenderness to palpation of the indicated tooth.  Tooth is chipped in the area.  Several dental caries noted.  No gross dental abscess noted. No facial, neck or cheek swelling noted. No pooling of secretions or trismus.  Normal voice noted with no difficulty swallowing or breathing.  No submandibular erythema, edema or crepitus noted. Eyes:     General: No scleral icterus.    Conjunctiva/sclera: Conjunctivae normal.  Neck:     Musculoskeletal: Normal range of motion.  Cardiovascular:     Rate and Rhythm: Normal rate and regular rhythm.     Heart sounds: Normal heart sounds.  Pulmonary:     Effort: Pulmonary effort is normal. No respiratory distress.     Breath sounds: Normal breath sounds.  Skin:    Findings: No rash.  Neurological:     Mental Status: She is alert.      ED Treatments / Results  Labs (all labs ordered are listed, but only abnormal results are displayed) Labs Reviewed - No data to display  EKG None  Radiology No results found.  Procedures Procedures (including critical care time)  Medications Ordered in ED Medications  oxyCODONE-acetaminophen (PERCOCET/ROXICET) 5-325 MG per tablet 1 tablet (1 tablet Oral Given 07/10/19 2035)  amoxicillin-clavulanate (AUGMENTIN) 875-125 MG per tablet 1 tablet (1 tablet Oral Given 07/10/19 2035)     Initial Impression / Assessment and Plan / ED Course  I have reviewed the triage vital signs and the nursing notes.  Pertinent labs & imaging results that were available during my care of the patient were reviewed by me and considered in my medical decision making (see chart for details).        Patient with dentalgia. On exam, there is no evidence of a drainable abscess. No trismus, glossal elevation, unilateral tonsillar swelling. No evidence of retropharyngeal or peritonsillar abscess or Ludwig angina. Will treat with  Augmentin, Magic mouthwash and naproxen. Pt instructed to  follow-up with dentist as soon as possible. Resource guide provided with AVS.  Patient is hemodynamically stable, in NAD, and able to ambulate in the ED. Evaluation does not show pathology that would require ongoing emergent intervention or inpatient treatment. I explained the diagnosis to the patient. Pain has been managed and has no complaints prior to discharge. Patient is comfortable with above plan and is stable for discharge at this time. All questions were answered prior to disposition. Strict return precautions for returning to the ED were discussed. Encouraged follow up with PCP.   An After Visit Summary was printed and given to the patient.   Portions of this note were generated with Scientist, clinical (histocompatibility and immunogenetics)Dragon dictation software. Dictation errors may occur despite best attempts at proofreading.   Final Clinical Impressions(s) / ED Diagnoses  Final diagnoses:  Pain, dental    ED Discharge Orders         Ordered    amoxicillin-clavulanate (AUGMENTIN) 875-125 MG tablet  Every 12 hours     07/10/19 2037    lidocaine (XYLOCAINE) 2 % solution  Every 6 hours PRN     07/10/19 2037    naproxen (NAPROSYN) 500 MG tablet  2 times daily     07/10/19 2037           Delia Heady, PA-C 07/10/19 2037    Deno Etienne, DO 07/10/19 2144

## 2019-07-10 NOTE — ED Triage Notes (Signed)
Pt c/o left side facial pain x 2 days. States similar pain in the past. ?due to wisdom tooth

## 2019-07-10 NOTE — Discharge Instructions (Signed)
Take the antibiotics as directed.  Please complete the entire course of antibiotics regardless of symptom improvement to prevent worsening or recurrence of your infection. Follow-up with 1 of the dentist listed on the resource guide for ultimate treatment of your dental pain. Return to the ED if you start to have worsening symptoms, increased facial swelling, trouble breathing, trouble swallowing, neck stiffness.

## 2019-07-19 ENCOUNTER — Ambulatory Visit (INDEPENDENT_AMBULATORY_CARE_PROVIDER_SITE_OTHER)
Admission: RE | Admit: 2019-07-19 | Discharge: 2019-07-19 | Disposition: A | Discharge status: Home / Self Care | Payer: BC Managed Care – PPO | Source: Ambulatory Visit

## 2019-07-19 DIAGNOSIS — R197 Diarrhea, unspecified: Secondary | ICD-10-CM

## 2019-07-19 DIAGNOSIS — K921 Melena: Secondary | ICD-10-CM

## 2019-07-19 NOTE — Discharge Instructions (Signed)
Recommending further evaluation and management in the ED for watery diarrhea, blood in stool, and recent antibiotic use x 7 days.  Cannot rule out causes of infectious diarrhea and/or GI bleed via video visit.  Patient aware and in agreement with this plan.  Will go by private vehicle to nearest ED.

## 2019-07-19 NOTE — ED Provider Notes (Signed)
[facility]    Virtual Visit via Video Note:  [name]  initiated request for Telemedicine visit with [facility] team. I connected with Nancy Reeves  on 07/19/2019 at 8:28 PM  for a synchronized telemedicine visit using a video enabled HIPPA compliant telemedicine application. I verified that I am speaking with Nancy Reeves  using two identifiers. Lestine Box, PA-C  was physically located in a [facility] Urgent care site and [name] was located at a different location.   The limitations of evaluation and management by telemedicine as well as the availability of in-person appointments were discussed. Patient was informed that she  may incur a bill ( including co-pay) for this virtual visit encounter. Nancy Reeves  expressed understanding and gave verbal consent to proceed with virtual visit.   [id number] 07/19/19 Arrival Time: 1653  CC: Diarrhea  SUBJECTIVE:  Nancy Reeves is a 38 y.o. female who presents with complaint of worsening diarrhea x 1 week.  Symptoms began after starting augmentin 9 days ago.  Reports diarrhea initially was more formed, but now watery and brown.  Reports have approximately 5-6 episodes of diarrhea daily.  Denies abdominal pain.  Has discontinued antibiotic and tried probiotics without relief.  Symptoms made worse with eating.  Denies similar symptoms in the past.  Does admit to an episode of blood in stool, blood with wiping, and blood in the toile that occurred 3 days ago.  Reports previous symptoms in the past related to fissure/ hemorrhoids.  Also mentions foul odor with BM that is not normal for her.    Denies lightheadedness, dizziness, syncope, fever, chills,  nausea, vomiting, chest pain, SOB, constipation, melena, dysuria, difficulty urinating, increased frequency or urgency, flank pain, loss of bowel or bladder function.  No LMP recorded. (Menstrual status: Irregular Periods).  ROS: As per HPI.  All other pertinent ROS  negative.     Past Medical History:  Diagnosis Date  . Fibromyalgia   . GERD (gastroesophageal reflux disease)   . Migraine    psuedotumor cerebra  . Pseudotumor cerebri    Past Surgical History:  Procedure Laterality Date  . LUMBAR PUNCTURE     Allergies  Allergen Reactions  . Triptans Anaphylaxis   No current facility-administered medications on file prior to encounter.    Current Outpatient Medications on File Prior to Encounter  Medication Sig Dispense Refill  . amoxicillin-clavulanate (AUGMENTIN) 875-125 MG tablet Take 1 tablet by mouth every 12 (twelve) hours. 14 tablet 0  . lidocaine (XYLOCAINE) 2 % solution Use as directed 15 mLs in the mouth or throat every 6 (six) hours as needed for mouth pain. 100 mL 0  . naproxen (NAPROSYN) 500 MG tablet Take 1 tablet (500 mg total) by mouth 2 (two) times daily. 10 tablet 0  . [DISCONTINUED] DULoxetine (CYMBALTA) 60 MG capsule Take 60 mg by mouth daily.    . [DISCONTINUED] gabapentin (NEURONTIN) 300 MG capsule Take 300 mg by mouth 3 (three) times daily.       OBJECTIVE:  There were no vitals filed for this visit.  General appearance: alert; no distress Eyes: EOMI grossly HENT: normocephalic; atraumatic Neck: supple with FROM Lungs: normal respiratory effort; speaking in full sentences without difficulty Extremities: moves extremities without difficulty Skin: No obvious rashes Neurologic: No facial asymmetries Psychological: alert and cooperative; normal mood and affect   ASSESSMENT & PLAN:  1. Watery diarrhea   2. Blood in stool    Recommending further evaluation and management in  the ED for watery diarrhea x 1 week, blood in stool, and recent antibiotic use.  Cannot rule out causes of infectious diarrhea and/or GI bleed via video visit.  Patient aware and in agreement with this plan.  Will go by private vehicle to nearest ED.    I provided 14 minutes of non-face-to-face time during this encounter.  CalvinBrittany Laverta Harnisch,  PA-C  07/19/2019 8:28 PM    Rennis HardingWurst, Aaro Meyers, PA-C 07/19/19 2029

## 2019-07-21 ENCOUNTER — Encounter (HOSPITAL_BASED_OUTPATIENT_CLINIC_OR_DEPARTMENT_OTHER): Payer: Self-pay | Admitting: *Deleted

## 2019-07-21 ENCOUNTER — Emergency Department (HOSPITAL_BASED_OUTPATIENT_CLINIC_OR_DEPARTMENT_OTHER)
Admission: EM | Admit: 2019-07-21 | Discharge: 2019-07-21 | Disposition: A | Discharge status: Home / Self Care | Payer: BC Managed Care – PPO | Source: Home / Self Care | Attending: Emergency Medicine | Admitting: Emergency Medicine

## 2019-07-21 ENCOUNTER — Other Ambulatory Visit: Payer: Self-pay

## 2019-07-21 ENCOUNTER — Encounter (HOSPITAL_COMMUNITY): Payer: Self-pay

## 2019-07-21 ENCOUNTER — Emergency Department (HOSPITAL_COMMUNITY)
Admission: EM | Admit: 2019-07-21 | Discharge: 2019-07-21 | Discharge status: Against Medical Advice | Payer: BC Managed Care – PPO | Attending: Emergency Medicine | Admitting: Emergency Medicine

## 2019-07-21 DIAGNOSIS — Z5321 Procedure and treatment not carried out due to patient leaving prior to being seen by health care provider: Secondary | ICD-10-CM | POA: Insufficient documentation

## 2019-07-21 DIAGNOSIS — R197 Diarrhea, unspecified: Secondary | ICD-10-CM

## 2019-07-21 DIAGNOSIS — R195 Other fecal abnormalities: Secondary | ICD-10-CM | POA: Insufficient documentation

## 2019-07-21 LAB — COMPREHENSIVE METABOLIC PANEL
ALT: 21 U/L (ref 0–44)
AST: 24 U/L (ref 15–41)
Albumin: 3.1 g/dL — ABNORMAL LOW (ref 3.5–5.0)
Alkaline Phosphatase: 63 U/L (ref 38–126)
Anion gap: 9 (ref 5–15)
BUN: 6 mg/dL (ref 6–20)
CO2: 23 mmol/L (ref 22–32)
Calcium: 8.9 mg/dL (ref 8.9–10.3)
Chloride: 104 mmol/L (ref 98–111)
Creatinine, Ser: 0.93 mg/dL (ref 0.44–1.00)
GFR calc Af Amer: >60 mL/min (ref >60)
GFR calc non Af Amer: >60 mL/min (ref >60)
Glucose, Bld: 103 mg/dL — ABNORMAL HIGH (ref 70–99)
Potassium: 4 mmol/L (ref 3.5–5.1)
Sodium: 136 mmol/L (ref 135–145)
Total Bilirubin: 0.4 mg/dL (ref 0.3–1.2)
Total Protein: 7.6 g/dL (ref 6.5–8.1)

## 2019-07-21 LAB — LIPASE, BLOOD: Lipase: 21 U/L (ref 11–51)

## 2019-07-21 LAB — CBC
HCT: 34.5 % — ABNORMAL LOW (ref 36.0–46.0)
Hemoglobin: 10.5 g/dL — ABNORMAL LOW (ref 12.0–15.0)
MCH: 22.2 pg — ABNORMAL LOW (ref 26.0–34.0)
MCHC: 30.4 g/dL (ref 30.0–36.0)
MCV: 73.1 fL — ABNORMAL LOW (ref 80.0–100.0)
Platelets: 368 10*3/uL (ref 150–400)
RBC: 4.72 MIL/uL (ref 3.87–5.11)
RDW: 16.8 % — ABNORMAL HIGH (ref 11.5–15.5)
WBC: 9.3 10*3/uL (ref 4.0–10.5)
nRBC: 0 % (ref 0.0–0.2)

## 2019-07-21 LAB — I-STAT BETA HCG BLOOD, ED (MC, WL, AP ONLY): I-stat hCG, quantitative: <5 m[IU]/mL (ref <5)

## 2019-07-21 MED ORDER — SODIUM CHLORIDE 0.9% FLUSH: 3.0000 mL | Freq: Once | INTRAVENOUS | Status: DC

## 2019-07-21 NOTE — ED Notes (Signed)
Pt. Stated that she was not waiting and was going to [facility].

## 2019-07-21 NOTE — ED Provider Notes (Signed)
[facility] Provider Note: Nancy Reeves [name], MD, FACEP  CSN: [id number] MRN: [id number] ARRIVAL: 07/21/19 at 0156 ROOM: [facility]06/[facility]06   CHIEF COMPLAINT  Diarrhea   HISTORY OF PRESENT ILLNESS  07/21/19 2:08 AM Nancy Reeves is a 38 y.o. female who was treated with Augmentin on July 10, 2019 a dental infection.   The symptoms began 10 days ago. She discontinued the Augmentin after 2 days but has had persistent watery brown stools occasionally containing blood.  She also noted her bowel movements to have an abnormal odor on one day only.  She denies nausea, vomiting, fever, chills or urinary symptoms.  She has had some sharp left lower quadrant and right lower quadrant abdominal pains which she rates as a 7 out of 10 and which are not worsened with movement or palpation. Two days ago she had a telemedicine visit with a chief complaint of diarrhea and blood in stools.  She was advised to be seen in the ED for further evaluation of possible C. difficile colitis.    Past Medical History:  Diagnosis Date  . Fibromyalgia   . GERD (gastroesophageal reflux disease)   . Migraine    psuedotumor cerebra  . Pseudotumor cerebri     Past Surgical History:  Procedure Laterality Date  . LUMBAR PUNCTURE      History reviewed. No pertinent family history.  Social History   Tobacco Use  . Smoking status: Never Smoker  . Smokeless tobacco: Never Used  Substance Use Topics  . Alcohol use: Not Currently    Frequency: Never    Comment: occasional  . Drug use: No    Prior to Admission medications   Medication Sig Start Date End Date Taking? Authorizing Provider  amoxicillin-clavulanate (AUGMENTIN) 875-125 MG tablet Take 1 tablet by mouth every 12 (twelve) hours. 07/10/19   Khatri, Hina, PA-C  lidocaine (XYLOCAINE) 2 % solution Use as directed 15 mLs in the mouth or throat every 6 (six) hours as needed for mouth pain. 07/10/19   Khatri, Hina, PA-C  naproxen (NAPROSYN) 500 MG tablet Take 1  tablet (500 mg total) by mouth 2 (two) times daily. 07/10/19   Khatri, Hina, PA-C  DULoxetine (CYMBALTA) 60 MG capsule Take 60 mg by mouth daily.  07/19/19  [provider]  gabapentin (NEURONTIN) 300 MG capsule Take 300 mg by mouth 3 (three) times daily.  07/19/19  [provider]    Allergies Triptans   REVIEW OF SYSTEMS  Negative except as noted here or in the History of Present Illness.   PHYSICAL EXAMINATION  Initial Vital Signs There were no vitals taken for this visit.  Examination General: Well-developed, well-nourished female in no acute distress; appearance consistent with age of record HENT: normocephalic; atraumatic Eyes: pupils equal, round and reactive to light; extraocular muscles intact Neck: supple Heart: regular rate and rhythm Lungs: clear to auscultation bilaterally Abdomen: soft; nondistended; nontender; bowel sounds present Extremities: No deformity; full range of motion; pulses normal Neurologic: Awake, alert and oriented; motor function intact in all extremities and symmetric; no facial droop Skin: Warm and dry Psychiatric: Normal mood and affect   RESULTS  Summary of this visit's results, reviewed by myself:   EKG Interpretation  Date/Time:    Ventricular Rate:    PR Interval:    QRS Duration:   QT Interval:    QTC Calculation:   R Axis:     Text Interpretation:        Laboratory Studies: Results for orders placed  or performed during the hospital encounter of 07/21/19 (from the past 24 hour(s))  Lipase, blood     Status: None   Collection Time: 07/21/19 12:58 AM  Result Value Ref Range   Lipase 21 11 - 51 U/L  Comprehensive metabolic panel     Status: Abnormal   Collection Time: 07/21/19 12:58 AM  Result Value Ref Range   Sodium 136 135 - 145 mmol/L   Potassium 4.0 3.5 - 5.1 mmol/L   Chloride 104 98 - 111 mmol/L   CO2 23 22 - 32 mmol/L   Glucose, Bld 103 (H) 70 - 99 mg/dL   BUN 6 6 - 20 mg/dL   Creatinine, Ser 0.93  0.44 - 1.00 mg/dL   Calcium 8.9 8.9 - 10.3 mg/dL   Total Protein 7.6 6.5 - 8.1 g/dL   Albumin 3.1 (L) 3.5 - 5.0 g/dL   AST 24 15 - 41 U/L   ALT 21 0 - 44 U/L   Alkaline Phosphatase 63 38 - 126 U/L   Total Bilirubin 0.4 0.3 - 1.2 mg/dL   GFR calc non Af Amer >60 >60 mL/min   GFR calc Af Amer >60 >60 mL/min   Anion gap 9 5 - 15  CBC     Status: Abnormal   Collection Time: 07/21/19 12:58 AM  Result Value Ref Range   WBC 9.3 4.0 - 10.5 K/uL   RBC 4.72 3.87 - 5.11 MIL/uL   Hemoglobin 10.5 (L) 12.0 - 15.0 g/dL   HCT 34.5 (L) 36.0 - 46.0 %   MCV 73.1 (L) 80.0 - 100.0 fL   MCH 22.2 (L) 26.0 - 34.0 pg   MCHC 30.4 30.0 - 36.0 g/dL   RDW 16.8 (H) 11.5 - 15.5 %   Platelets 368 150 - 400 K/uL   nRBC 0.0 0.0 - 0.2 %  I-Stat beta hCG blood, ED     Status: None   Collection Time: 07/21/19  1:19 AM  Result Value Ref Range   I-stat hCG, quantitative <5.0 <5 mIU/mL   Comment 3           Imaging Studies: No results found.  ED COURSE and MDM  Nursing notes and initial vitals signs, including pulse oximetry, reviewed.  Vitals:   07/21/19 0211 07/21/19 0212  BP: (!) 146/92   Pulse: (!) 103   Resp: 18   Temp: 99.4 F (37.4 C)   TempSrc: Oral   SpO2: 98%   Weight:  103.4 kg  Height:  5\' 2"  (1.575 m)   Patient unable to give an acceptable specimen in the ED.  We will have her collect at home and return to the lab.  We will not begin antibiotics pending results.  PROCEDURES    ED DIAGNOSES     ICD-10-CM   1. Bloody diarrhea  R19.7        Kreston Ahrendt, Jenny Reichmann, MD 07/21/19 [id number]

## 2019-07-21 NOTE — ED Triage Notes (Signed)
Pt states that she was placed on antibiotics last week for a toothache and since has had diarrhea, did e visit with UC yesterday and told she may have cdiff

## 2019-07-21 NOTE — ED Triage Notes (Signed)
Treated for toothache with amoxicillin last week, reports diarrhea x 3 days. Told that she may have cdiff

## 2019-08-06 DIAGNOSIS — J302 Other seasonal allergic rhinitis: Secondary | ICD-10-CM | POA: Insufficient documentation

## 2019-08-06 DIAGNOSIS — K219 Gastro-esophageal reflux disease without esophagitis: Secondary | ICD-10-CM | POA: Insufficient documentation

## 2019-08-12 ENCOUNTER — Emergency Department (HOSPITAL_BASED_OUTPATIENT_CLINIC_OR_DEPARTMENT_OTHER): Payer: BC Managed Care – PPO

## 2019-08-12 ENCOUNTER — Encounter (HOSPITAL_BASED_OUTPATIENT_CLINIC_OR_DEPARTMENT_OTHER): Payer: Self-pay | Admitting: Emergency Medicine

## 2019-08-12 ENCOUNTER — Inpatient Hospital Stay (HOSPITAL_BASED_OUTPATIENT_CLINIC_OR_DEPARTMENT_OTHER)
Admission: EM | Admit: 2019-08-12 | Discharge: 2019-08-18 | DRG: 872 | Disposition: A | Discharge status: Home / Self Care | Payer: BC Managed Care – PPO | Attending: Student | Admitting: Student

## 2019-08-12 ENCOUNTER — Other Ambulatory Visit: Payer: Self-pay

## 2019-08-12 DIAGNOSIS — E876 Hypokalemia: Secondary | ICD-10-CM | POA: Diagnosis present

## 2019-08-12 DIAGNOSIS — R0602 Shortness of breath: Secondary | ICD-10-CM

## 2019-08-12 DIAGNOSIS — D509 Iron deficiency anemia, unspecified: Secondary | ICD-10-CM | POA: Diagnosis present

## 2019-08-12 DIAGNOSIS — K529 Noninfective gastroenteritis and colitis, unspecified: Secondary | ICD-10-CM

## 2019-08-12 DIAGNOSIS — J9811 Atelectasis: Secondary | ICD-10-CM | POA: Diagnosis not present

## 2019-08-12 DIAGNOSIS — R109 Unspecified abdominal pain: Secondary | ICD-10-CM

## 2019-08-12 DIAGNOSIS — G43909 Migraine, unspecified, not intractable, without status migrainosus: Secondary | ICD-10-CM | POA: Diagnosis not present

## 2019-08-12 DIAGNOSIS — A419 Sepsis, unspecified organism: Secondary | ICD-10-CM | POA: Diagnosis not present

## 2019-08-12 DIAGNOSIS — K219 Gastro-esophageal reflux disease without esophagitis: Secondary | ICD-10-CM | POA: Diagnosis present

## 2019-08-12 DIAGNOSIS — R7303 Prediabetes: Secondary | ICD-10-CM | POA: Diagnosis present

## 2019-08-12 DIAGNOSIS — A414 Sepsis due to anaerobes: Principal | ICD-10-CM | POA: Diagnosis present

## 2019-08-12 DIAGNOSIS — G932 Benign intracranial hypertension: Secondary | ICD-10-CM | POA: Diagnosis present

## 2019-08-12 DIAGNOSIS — E871 Hypo-osmolality and hyponatremia: Secondary | ICD-10-CM | POA: Diagnosis present

## 2019-08-12 DIAGNOSIS — D72829 Elevated white blood cell count, unspecified: Secondary | ICD-10-CM | POA: Diagnosis present

## 2019-08-12 DIAGNOSIS — M797 Fibromyalgia: Secondary | ICD-10-CM | POA: Diagnosis present

## 2019-08-12 DIAGNOSIS — Z888 Allergy status to other drugs, medicaments and biological substances status: Secondary | ICD-10-CM | POA: Diagnosis not present

## 2019-08-12 DIAGNOSIS — Z20828 Contact with and (suspected) exposure to other viral communicable diseases: Secondary | ICD-10-CM | POA: Diagnosis present

## 2019-08-12 DIAGNOSIS — E861 Hypovolemia: Secondary | ICD-10-CM | POA: Diagnosis present

## 2019-08-12 DIAGNOSIS — G43809 Other migraine, not intractable, without status migrainosus: Secondary | ICD-10-CM | POA: Diagnosis not present

## 2019-08-12 DIAGNOSIS — Z79899 Other long term (current) drug therapy: Secondary | ICD-10-CM

## 2019-08-12 DIAGNOSIS — Z79890 Hormone replacement therapy (postmenopausal): Secondary | ICD-10-CM

## 2019-08-12 DIAGNOSIS — R197 Diarrhea, unspecified: Secondary | ICD-10-CM

## 2019-08-12 DIAGNOSIS — E872 Acidosis: Secondary | ICD-10-CM | POA: Diagnosis present

## 2019-08-12 DIAGNOSIS — K625 Hemorrhage of anus and rectum: Secondary | ICD-10-CM | POA: Diagnosis not present

## 2019-08-12 DIAGNOSIS — Z6841 Body Mass Index (BMI) 40.0 and over, adult: Secondary | ICD-10-CM | POA: Diagnosis not present

## 2019-08-12 DIAGNOSIS — N200 Calculus of kidney: Secondary | ICD-10-CM | POA: Diagnosis present

## 2019-08-12 DIAGNOSIS — A0472 Enterocolitis due to Clostridium difficile, not specified as recurrent: Secondary | ICD-10-CM | POA: Diagnosis present

## 2019-08-12 LAB — COMPREHENSIVE METABOLIC PANEL
ALT: 13 U/L (ref 0–44)
AST: 17 U/L (ref 15–41)
Albumin: 2.6 g/dL — ABNORMAL LOW (ref 3.5–5.0)
Alkaline Phosphatase: 51 U/L (ref 38–126)
Anion gap: 12 (ref 5–15)
BUN: 10 mg/dL (ref 6–20)
CO2: 19 mmol/L — ABNORMAL LOW (ref 22–32)
Calcium: 8 mg/dL — ABNORMAL LOW (ref 8.9–10.3)
Chloride: 98 mmol/L (ref 98–111)
Creatinine, Ser: 0.77 mg/dL (ref 0.44–1.00)
GFR calc Af Amer: >60 mL/min (ref >60)
GFR calc non Af Amer: >60 mL/min (ref >60)
Glucose, Bld: 85 mg/dL (ref 70–99)
Potassium: 3.8 mmol/L (ref 3.5–5.1)
Sodium: 129 mmol/L — ABNORMAL LOW (ref 135–145)
Total Bilirubin: 0.5 mg/dL (ref 0.3–1.2)
Total Protein: 7.3 g/dL (ref 6.5–8.1)

## 2019-08-12 LAB — CBC WITH DIFFERENTIAL/PLATELET
Abs Immature Granulocytes: 0 10*3/uL (ref 0.00–0.07)
Band Neutrophils: 28 %
Basophils Absolute: 0 10*3/uL (ref 0.0–0.1)
Basophils Relative: 0 %
Eosinophils Absolute: 0.5 10*3/uL (ref 0.0–0.5)
Eosinophils Relative: 4 %
HCT: 36 % (ref 36.0–46.0)
Hemoglobin: 10.9 g/dL — ABNORMAL LOW (ref 12.0–15.0)
Lymphocytes Relative: 12 %
Lymphs Abs: 1.4 10*3/uL (ref 0.7–4.0)
MCH: 21.4 pg — ABNORMAL LOW (ref 26.0–34.0)
MCHC: 30.3 g/dL (ref 30.0–36.0)
MCV: 70.7 fL — ABNORMAL LOW (ref 80.0–100.0)
Monocytes Absolute: 1.2 10*3/uL — ABNORMAL HIGH (ref 0.1–1.0)
Monocytes Relative: 11 %
Neutro Abs: 8.2 10*3/uL — ABNORMAL HIGH (ref 1.7–7.7)
Neutrophils Relative %: 45 %
Platelets: 397 10*3/uL (ref 150–400)
RBC: 5.09 MIL/uL (ref 3.87–5.11)
RDW: 16.5 % — ABNORMAL HIGH (ref 11.5–15.5)
Smear Review: NORMAL
WBC: 11.3 10*3/uL — ABNORMAL HIGH (ref 4.0–10.5)
nRBC: 0 % (ref 0.0–0.2)

## 2019-08-12 LAB — C DIFFICILE QUICK SCREEN W PCR REFLEX
C Diff antigen: POSITIVE — AB
C Diff toxin: NEGATIVE

## 2019-08-12 LAB — URINALYSIS, MICROSCOPIC (REFLEX)

## 2019-08-12 LAB — URINALYSIS, ROUTINE W REFLEX MICROSCOPIC
Bilirubin Urine: NEGATIVE
Glucose, UA: NEGATIVE mg/dL
Ketones, ur: >80 mg/dL — AB
Leukocytes,Ua: NEGATIVE
Nitrite: NEGATIVE
Protein, ur: NEGATIVE mg/dL
Specific Gravity, Urine: 1.01 (ref 1.005–1.030)
pH: 5.5 (ref 5.0–8.0)

## 2019-08-12 LAB — CLOSTRIDIUM DIFFICILE BY PCR, REFLEXED: Toxigenic C. Difficile by PCR: POSITIVE — AB

## 2019-08-12 LAB — SARS CORONAVIRUS 2 BY RT PCR (HOSPITAL ORDER, PERFORMED IN ~~LOC~~ HOSPITAL LAB): SARS Coronavirus 2: NEGATIVE

## 2019-08-12 LAB — PREGNANCY, URINE: Preg Test, Ur: NEGATIVE

## 2019-08-12 LAB — LIPASE, BLOOD: Lipase: 17 U/L (ref 11–51)

## 2019-08-12 MED ORDER — MORPHINE SULFATE (PF) 2 MG/ML IV SOLN
2.0000 mg | INTRAVENOUS | Status: DC | PRN
  Administered 2019-08-12 – 2019-08-14 (×7): 2 mg via INTRAVENOUS
  Filled 2019-08-12 (×7): qty 1

## 2019-08-12 MED ORDER — METRONIDAZOLE IN NACL 5-0.79 MG/ML-% IV SOLN
500.0000 mg | Freq: Three times a day (TID) | INTRAVENOUS | Status: DC
  Administered 2019-08-12 – 2019-08-13 (×2): 500 mg via INTRAVENOUS
  Filled 2019-08-12 (×2): qty 100

## 2019-08-12 MED ORDER — VANCOMYCIN 50 MG/ML ORAL SOLUTION: 125.0000 mg | Freq: Every day | ORAL | Status: DC

## 2019-08-12 MED ORDER — IBUPROFEN 200 MG PO TABS
600.0000 mg | ORAL_TABLET | Freq: Four times a day (QID) | ORAL | Status: DC | PRN
  Administered 2019-08-12: 23:00:00 600 mg via ORAL
  Filled 2019-08-12: qty 3

## 2019-08-12 MED ORDER — ONDANSETRON HCL 4 MG/2ML IJ SOLN
4.0000 mg | Freq: Four times a day (QID) | INTRAMUSCULAR | Status: DC | PRN
  Administered 2019-08-12 – 2019-08-15 (×6): 4 mg via INTRAVENOUS
  Filled 2019-08-12 (×6): qty 2

## 2019-08-12 MED ORDER — ACETAMINOPHEN 325 MG PO TABS
650.0000 mg | ORAL_TABLET | Freq: Once | ORAL | Status: AC
  Administered 2019-08-12: 650 mg via ORAL
  Filled 2019-08-12: qty 2

## 2019-08-12 MED ORDER — SODIUM CHLORIDE 0.9 % IV SOLN
2.0000 g | Freq: Once | INTRAVENOUS | Status: AC
  Administered 2019-08-12: 2 g via INTRAVENOUS
  Filled 2019-08-12: qty 20

## 2019-08-12 MED ORDER — KETOROLAC TROMETHAMINE 30 MG/ML IJ SOLN
30.0000 mg | Freq: Once | INTRAMUSCULAR | Status: AC
  Administered 2019-08-12: 30 mg via INTRAVENOUS
  Filled 2019-08-12: qty 1

## 2019-08-12 MED ORDER — CIPROFLOXACIN IN D5W 400 MG/200ML IV SOLN
400.0000 mg | Freq: Two times a day (BID) | INTRAVENOUS | Status: DC
  Administered 2019-08-12 – 2019-08-13 (×2): 400 mg via INTRAVENOUS
  Filled 2019-08-12 (×2): qty 200

## 2019-08-12 MED ORDER — VANCOMYCIN 50 MG/ML ORAL SOLUTION: 125.0000 mg | ORAL | Status: DC

## 2019-08-12 MED ORDER — VANCOMYCIN 50 MG/ML ORAL SOLUTION: 125.0000 mg | Freq: Two times a day (BID) | ORAL | Status: DC

## 2019-08-12 MED ORDER — HYDROMORPHONE HCL 1 MG/ML IJ SOLN
1.0000 mg | Freq: Once | INTRAMUSCULAR | Status: AC
  Administered 2019-08-12: 1 mg via INTRAVENOUS
  Filled 2019-08-12: qty 1

## 2019-08-12 MED ORDER — KCL IN DEXTROSE-NACL 20-5-0.9 MEQ/L-%-% IV SOLN
INTRAVENOUS | Status: DC
  Administered 2019-08-12 – 2019-08-18 (×13): via INTRAVENOUS
  Filled 2019-08-12 (×17): qty 1000

## 2019-08-12 MED ORDER — VANCOMYCIN 50 MG/ML ORAL SOLUTION: 125.0000 mg | Freq: Four times a day (QID) | ORAL | Status: DC

## 2019-08-12 MED ORDER — ONDANSETRON HCL 4 MG/2ML IJ SOLN
4.0000 mg | Freq: Once | INTRAMUSCULAR | Status: AC
  Administered 2019-08-12: 4 mg via INTRAVENOUS
  Filled 2019-08-12: qty 2

## 2019-08-12 MED ORDER — SODIUM CHLORIDE 0.9 % IV SOLN
INTRAVENOUS | Status: DC | PRN
  Administered 2019-08-12: 250 mL via INTRAVENOUS

## 2019-08-12 MED ORDER — MORPHINE SULFATE (PF) 4 MG/ML IV SOLN
4.0000 mg | Freq: Once | INTRAVENOUS | Status: AC
  Administered 2019-08-12: 4 mg via INTRAVENOUS
  Filled 2019-08-12: qty 1

## 2019-08-12 MED ORDER — SODIUM CHLORIDE 0.9 % IV BOLUS
1000.0000 mL | Freq: Once | INTRAVENOUS | Status: AC
  Administered 2019-08-12: 1000 mL via INTRAVENOUS

## 2019-08-12 MED ORDER — ONDANSETRON HCL 4 MG PO TABS
4.0000 mg | ORAL_TABLET | Freq: Four times a day (QID) | ORAL | Status: DC | PRN
  Administered 2019-08-13: 4 mg via ORAL
  Filled 2019-08-12: qty 1

## 2019-08-12 MED ORDER — VANCOMYCIN 50 MG/ML ORAL SOLUTION
125.0000 mg | Freq: Four times a day (QID) | ORAL | Status: DC
  Administered 2019-08-12 – 2019-08-16 (×11): 125 mg via ORAL
  Filled 2019-08-12 (×19): qty 2.5

## 2019-08-12 MED ORDER — IOHEXOL 300 MG/ML  SOLN
100.0000 mL | Freq: Once | INTRAMUSCULAR | Status: AC | PRN
  Administered 2019-08-12: 100 mL via INTRAVENOUS

## 2019-08-12 MED ORDER — METRONIDAZOLE IN NACL 5-0.79 MG/ML-% IV SOLN
500.0000 mg | Freq: Once | INTRAVENOUS | Status: AC
  Administered 2019-08-12: 500 mg via INTRAVENOUS
  Filled 2019-08-12: qty 100

## 2019-08-12 NOTE — Progress Notes (Signed)
38 year old female who presents with diarrhea for about 4 weeks.  Apparently patient received oral antibiotics (Augmentin) August 22 for dental infection.  Shortly after developing diarrhea.  Associated with abdominal pain.  Patient was evaluated September 2 at [facility] emergency department and discharged home.   She returns today due to persistent diarrhea.   Her vital signs show tachycardia 118 bpm, blood pressure 127/81, temperature 98.9, respiratory rate 20, oxygen saturation 100% on room air.  Positive leukocytosis 11.3.  CT of the abdomen with colitis extending from the proximal descending colon through the distal sigmoid colon without associated perforation or abscess.  The appendix was noted to be enlarged, possible early acute appendicitis.  Case was discussed with Dr. Marcello Moores  from general surgery, who recommended admission for further evaluation.  Patient has received ceftriaxone, metronidazole and normal saline.  Patient admitted to [place] ward.  To call surgery on arrival to [facility].  Patient will benefit from C. difficile testing.

## 2019-08-12 NOTE — Progress Notes (Signed)
Pharmacy Antibiotic Note  Nancy Reeves is a 38 y.o. female admitted on 08/12/2019 with [facility].  Pharmacy has been consulted for Cipro dosing.  Plan: Cipro 400 mg IV q12 Flagyl 500 mg IV q8h Pt also on new Vancomycin oral 1250 mg PO QID x 10 days for + C diff.  Pharmacy to sign off  Height: 5\' 2"  (157.5 cm) Weight: 225 lb (102.1 kg) IBW/kg (Calculated) : 50.1  Temp (24hrs), Avg:100.1 F (37.8 C), Min:98.9 F (37.2 C), Max:101.5 F (38.6 C)  Recent Labs  Lab 08/12/19 1229 08/12/19 1302  WBC 11.3*  --   CREATININE  --  0.77    Estimated Creatinine Clearance: 106.7 mL/min (by C-G formula based on SCr of 0.77 mg/dL).    Allergies  Allergen Reactions  . Triptans Anaphylaxis   Thank you for allowing pharmacy to be a part of this patient's care.  Eudelia Bunch, Pharm.D [phone] 08/12/2019 7:37 PM

## 2019-08-12 NOTE — ED Triage Notes (Signed)
Pt sts she has had watery diarrhea x 4 wks; was seen here for same; sts she has not been able to get a stool sample; has also been seen at [facility] for same

## 2019-08-12 NOTE — ED Provider Notes (Signed)
[facility] EMERGENCY DEPARTMENT Provider Note   CSN: [id number] Arrival date & time: 08/12/19  1110     History   Chief Complaint Chief Complaint  Patient presents with   Diarrhea    HPI Nancy Reeves is a 38 y.o. female.  She is complaining of crampy abdominal pain and diarrhea for the last 4 weeks.  She said it started after taking Augmentin for dental infection.  Since then she has been having watery diarrhea up to 10 times a day along with severe abdominal cramps.  Nausea and low-grade fevers.  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  She said that made her abdominal cramps worse.  She is supposed to see a colorectal doctor next week for the possible fistula along with hemorrhoids.  She is here because her symptoms are now controlled.  Other than hemorrhoids no prior history of any abdominal problems.     The history is provided by the patient.  Diarrhea Quality:  Watery Severity:  Severe Onset quality:  Gradual Duration:  4 weeks Timing:  Intermittent Progression:  Unchanged Relieved by:  Nothing Worsened by:  Nothing Ineffective treatments:  Change in diet Associated symptoms: abdominal pain and fever   Associated symptoms: no headaches and no vomiting   Risk factors: recent antibiotic use     Past Medical History:  Diagnosis Date   Fibromyalgia    GERD (gastroesophageal reflux disease)    Migraine    psuedotumor cerebra   Pseudotumor cerebri     There are no active problems to display for this patient.   Past Surgical History:  Procedure Laterality Date   LUMBAR PUNCTURE       OB History   No obstetric history on file.      Home Medications    Prior to Admission medications   Medication Sig Start Date End Date Taking? Authorizing Provider  FAMOTIDINE PO Take by mouth.   Yes [provider]  Sulfamethoxazole-Trimethoprim (BACTRIM PO) Take by mouth. Pt not  taking   Yes [provider]  tiZANidine (ZANAFLEX) 4 MG capsule Take by mouth. 08/03/19 09/02/19 Yes [provider]  amoxicillin-clavulanate (AUGMENTIN) 875-125 MG tablet Take 1 tablet by mouth every 12 (twelve) hours. 07/10/19   Dietrich Pates, PA-C  fluconazole (DIFLUCAN) 100 MG tablet  08/06/19   [provider]  hyoscyamine (LEVBID) 0.375 MG 12 hr tablet  08/08/19   [provider]  lidocaine (XYLOCAINE) 2 % solution Use as directed 15 mLs in the mouth or throat every 6 (six) hours as needed for mouth pain. 07/10/19   Khatri, Hina, PA-C  medroxyPROGESTERone (PROVERA) 10 MG tablet TK 1 T PO QD 07/08/19   [provider]  naproxen (NAPROSYN) 500 MG tablet Take 1 tablet (500 mg total) by mouth 2 (two) times daily. 07/10/19   Khatri, Hina, PA-C  DULoxetine (CYMBALTA) 60 MG capsule Take 60 mg by mouth daily.  07/19/19  [provider]  gabapentin (NEURONTIN) 300 MG capsule Take 300 mg by mouth 3 (three) times daily.  07/19/19  [provider]    Family History No family history on file.  Social History Social History   Tobacco Use   Smoking status: Never Smoker   Smokeless tobacco: Never Used  Substance Use Topics   Alcohol use: Not Currently    Frequency: Never    Comment: occasional   Drug use: No  Allergies   Triptans   Review of Systems Review of Systems  Constitutional: Positive for fever.  HENT: Negative for sore throat.   Eyes: Negative for visual disturbance.  Respiratory: Negative for shortness of breath.   Cardiovascular: Negative for chest pain.  Gastrointestinal: Positive for abdominal pain and diarrhea. Negative for vomiting.  Genitourinary: Negative for dysuria.  Musculoskeletal: Negative for neck pain.  Skin: Negative for rash.  Neurological: Negative for headaches.     Physical Exam Updated Vital Signs BP 127/81 (BP Location: Right Arm)    Pulse (!) 118    Temp 98.9 F (37.2 C) (Oral)     Resp 20    Ht 5\' 2"  (1.575 m)    Wt 102.1 kg    SpO2 100%    BMI 41.15 kg/m   Physical Exam Vitals signs and nursing note reviewed.  Constitutional:      General: She is not in acute distress.    Appearance: She is well-developed.  HENT:     Head: Normocephalic and atraumatic.  Eyes:     Conjunctiva/sclera: Conjunctivae normal.  Neck:     Musculoskeletal: Neck supple.  Cardiovascular:     Rate and Rhythm: Normal rate and regular rhythm.     Heart sounds: No murmur.  Pulmonary:     Effort: Pulmonary effort is normal. No respiratory distress.     Breath sounds: Normal breath sounds.  Abdominal:     Palpations: Abdomen is soft.     Tenderness: There is abdominal tenderness (diffuse). There is no guarding or rebound.  Musculoskeletal: Normal range of motion.     Right lower leg: No edema.     Left lower leg: No edema.  Skin:    General: Skin is warm and dry.     Capillary Refill: Capillary refill takes less than 2 seconds.  Neurological:     General: No focal deficit present.     Mental Status: She is alert.      ED Treatments / Results  Labs (all labs ordered are listed, but only abnormal results are displayed) Labs Reviewed  CBC WITH DIFFERENTIAL/PLATELET - Abnormal; Notable for the following components:      Result Value   WBC 11.3 (*)    Hemoglobin 10.9 (*)    MCV 70.7 (*)    MCH 21.4 (*)    RDW 16.5 (*)    Neutro Abs 8.2 (*)    Monocytes Absolute 1.2 (*)    All other components within normal limits  URINALYSIS, ROUTINE W REFLEX MICROSCOPIC - Abnormal; Notable for the following components:   Hgb urine dipstick TRACE (*)    Ketones, ur >80 (*)    All other components within normal limits  COMPREHENSIVE METABOLIC PANEL - Abnormal; Notable for the following components:   Sodium 129 (*)    CO2 19 (*)    Calcium 8.0 (*)    Albumin 2.6 (*)    All other components within normal limits  URINALYSIS, MICROSCOPIC (REFLEX) - Abnormal; Notable for the following  components:   Bacteria, UA FEW (*)    All other components within normal limits  SARS CORONAVIRUS 2 (HOSPITAL ORDER, [place] [facility])  C DIFFICILE QUICK SCREEN W PCR REFLEX  PREGNANCY, URINE  LIPASE, BLOOD  GI PATHOGEN PANEL BY PCR, STOOL    EKG None  Radiology Ct Abdomen Pelvis W Contrast  Result Date: 08/12/2019 CLINICAL DATA:  Abdo pain and diarrhea EXAM: CT ABDOMEN AND PELVIS WITH CONTRAST TECHNIQUE: Multidetector  CT imaging of the abdomen and pelvis was performed using the standard protocol following bolus administration of intravenous contrast. Oral contrast was also administered. CONTRAST:  OMNIPAQUE IOHEXOL 300 MG/ML  SOLN COMPARISON:  None. FINDINGS: Lower chest: There is atelectatic change in the anterior left base. A lesser degree of atelectasis is noted in the posterior left base. Lungs bases elsewhere clear. Hepatobiliary: No focal liver lesions are demonstrable. The gallbladder wall is not appreciably thickened. There is no evident biliary duct dilatation. Pancreas: There is no pancreatic mass or inflammatory focus. Spleen: No splenic lesions are evident. Adrenals/Urinary Tract: Adrenals bilaterally appear normal. Kidneys bilaterally show no evident mass or hydronephrosis on either side. There is a 1 mm calculus in the upper pole left kidney. A second 1 mm calculus is noted in the mid left kidney. There is no evident ureteral calculus on either side. Urinary bladder is midline with wall thickness within normal limits. Stomach/Bowel: There is wall thickening throughout the descending colon arising slightly distal to the sigmoid colon with involvement of the remainder of the descending colon and sigmoid colon to the level of the rectum. Rectum appears unremarkable. No appreciable diverticular disease is noted in this area. There is slight soft tissue stranding immediately adjacent to the left colon at the sites of wall thickening. Elsewhere, there is no  appreciable bowel wall thickening. No bowel obstruction evident. No free air or portal venous air. The terminal ileum appears unremarkable. No evident intramural air. Vascular/Lymphatic: No abdominal aortic aneurysm. No vascular lesions are evident. Mesenteric arterial vessels appear widely patent. There is no adenopathy by size criteria in the abdomen or pelvis. There are scattered subcentimeter mesenteric lymph nodes, primarily in the right abdomen. Reproductive: The uterus is anteverted, slightly canted to the right. No pelvic masses evident. Other: Appendix appears prominent measuring 9 mm in thickness. There is no appreciable appendiceal wall thickening or enhancement. There is no periappendiceal region fluid. There is equivocal soft tissue stranding in this area. There is no abscess or ascites in the abdomen or pelvis. Musculoskeletal: No blastic or lytic bone lesions. No intramuscular or abdominal wall lesions are evident. IMPRESSION: 1. The appendix measures 9 mm in thickness which is enlarged. There is equivocal soft tissue stranding in the region of the appendix. This appearance must be viewed as concerning for early acute appendiceal inflammation. No appendiceal abscess or air. Several subcentimeter lymph nodes are noted in this area which may well have inflammatory etiology. Surgical consultation with respect to the appearance of the right lower quadrant is warranted. Appendix: Location: Appendix arises inferiorly from the cecum and extends inferiorly and medially in the upper right pelvis. Diameter: 9 mm Appendicolith: None Mucosal hyper-enhancement: None Extraluminal gas: None Periappendiceal collection: None. Slight periappendiceal soft tissue stranding noted. 2. There is colitis extending from the proximal descending colon through the distal sigmoid colon without associated perforation or abscess. No diverticular disease noted in this area. Etiology for this colitis uncertain. Infectious colitis  statistically is the most likely etiology for this finding. 3. 1 mm calculi in the left kidney. No ureteral calculus or hydronephrosis on either side. Urinary bladder wall thickness within normal limits. Critical Value/emergent results were called by telephone at the time of interpretation on 08/12/2019 at 1:18 pm to providerMICHAEL Sherolyn Trettin , who verbally acknowledged these results. Electronically Signed   By: Bretta Bang III M.D.   On: 08/12/2019 13:18    Procedures Procedures (including critical care time)  Medications Ordered in ED Medications  0.9 %  sodium chloride infusion ( Intravenous Stopped 08/12/19 1614)  morphine 4 MG/ML injection 4 mg (4 mg Intravenous Given 08/12/19 1225)  ondansetron (ZOFRAN) injection 4 mg (4 mg Intravenous Given 08/12/19 1224)  sodium chloride 0.9 % bolus 1,000 mL ( Intravenous Stopped 08/12/19 1437)  iohexol (OMNIPAQUE) 300 MG/ML solution 100 mL (100 mLs Intravenous Contrast Given 08/12/19 1243)  HYDROmorphone (DILAUDID) injection 1 mg (1 mg Intravenous Given 08/12/19 1403)  cefTRIAXone (ROCEPHIN) 2 g in sodium chloride 0.9 % 100 mL IVPB ( Intravenous Stopped 08/12/19 1448)  metroNIDAZOLE (FLAGYL) IVPB 500 mg ( Intravenous Stopped 08/12/19 1607)     Initial Impression / Assessment and Plan / ED Course  I have reviewed the triage vital signs and the nursing notes.  Pertinent labs & imaging results that were available during my care of the patient were reviewed by me and considered in my medical decision making (see chart for details).  Clinical Course as of Aug 11 1638  Thu Aug 12, 2019  [id number] 38 year old female here with 4 weeks of diarrhea multiple times a day watery in nature along with crampy abdominal pain.  She is tachycardic here and is diffusely tender on exam.  Differential includes colitis, C. difficile, infectious diarrhea, bowel obstruction, abscess.  Getting labs and a CT giving IV fluids pain medication nausea medication.   [MB]  1428 Discussed  with Dr. Maisie Fushomas from general surgery.  She said it was unlikely that she has 2 processes going on at the same time but she would consult on the patient but does recommend admitting to the medical service and she will follow along as a Research scientist (medical)consultant.   [MB]    Clinical Course User Index [MB] Terrilee FilesButler, Torrez Renfroe C, MD        Final Clinical Impressions(s) / ED Diagnoses   Final diagnoses:  Colitis, acute  Hyponatremia  Diarrhea, unspecified type    ED Discharge Orders    None       Terrilee FilesButler, Anahita Cua C, MD 08/12/19 1640

## 2019-08-12 NOTE — Progress Notes (Addendum)
   08/12/19 2011  Vitals  Temp (!) 102.9 F (39.4 C)  Temp Source Oral  BP (!) 145/86  MAP (mmHg) 101  BP Location Right Arm  BP Method Automatic  Patient Position (if appropriate) Lying  Pulse Rate (!) 146  Resp 18  Oxygen Therapy  SpO2 100 %  O2 Device Room Air   Pt has MEWS of RED, due to pulse and temp. Patient arrived and MEWS was in red and doctor was notified per RN during prior shift. New orders given to day shift RN and tylenol given for temp of 101.5, temp rechecked over 30 mins later and temp is currently 102.9. MD on call paged and waiting for new orders. Surgeon on call currently in room now talking to patient. Pt being monitored and MEWS protocol taking place. Dawson Bills, RN

## 2019-08-12 NOTE — Consult Note (Addendum)
Re:   Nancy Reeves DOB:   1981/03/19 MRN:   [id number]  Chief Complaint Abdominal pain  ASSESEMENT AND PLAN: 1.  Left sided colitis on CT scan.  C Diff is positive - this is consistent with her history  2.  Possible appendicitis on CT scan  I think this is highly unlikely - in that her history, symptoms, and findings point to colitis.  Will follow.  4. Fibromyalgia  She sees Dr. Fayne Norrie[place]olenbiew[place] 5.  GERD 6.  Pseudotumor cerebri  Migraines  Se[facility] by [facility] - Dr. Charlesetta Ivoryarnes - she has not seen them in over a year 7.  Left kidney stone on CT scna  Chief Complaint  Patient presents with  . Diarrhea   PHYSICIAN REQUESTING CONSULTATION:  Dr. Meridee ScoreMichael Butler, [facility]  HISTORY OF PRESENT ILLNESS: Nancy Reeves is a 38 y.o. (DOB: 1981/03/19)  AA female whose primary care physician is Patient, No Pcp Per.  By looking at [employer], she gets a lot her [place] in [place] through the ER system.   She [facility] 07[facility]2019 for a left sided tooth ache.  I don't think that she has seen a dentist.  She was given antibiotics (Augmentin) and soon developed diarrhea. She stopped the antibiotics in two days.  She went back [facility] 07[facility]21/2019 with the complaint of diarrhea - she says they did nothing for her, but according to the notes, they ask her to collect a stool specimen and bring it back.  Either way, she continued the diarrhea and came back to [facility] today.  She has no history of stomach, liver, or colon issues.  She's had no prior abdominal surgery. She does have some mild GERD controlled with OTC meds.   Abdominal CT scan - 08/12/2019 1. The appendix measures 9 mm in thickness which is enlarged. There is equivocal soft tissue stranding in the region of the appendix. This appearance must be viewed as concerning for early acute appendiceal inflammation. No appendiceal abscess or air. Several subcentimeter  lymph nodes are noted in this area which may well have inflammatory etiology.   2. There is colitis extending from the proximal descending colon through the distal sigmoid colon without associated perforation or abscess. No diverticular disease noted in this area. Etiology for this colitis uncertain. Infectious colitis statistically is the most likely etiology for this finding.  3. 1 mm calculi in the left kidney. No ureteral calculus or hydronephrosis on either side. Urinary bladder wall thickness within normal limits. WBC - 11,300 - 08/12/2019    Past Medical History:  Diagnosis Date  . Fibromyalgia   . GERD (gastroesophageal reflux disease)   . Migraine    psuedotumor cerebra  . Pseudotumor cerebri       Past Surgical History:  Procedure Laterality Date  . LUMBAR PUNCTURE        Current Facility-Administered Medications  Medication Dose Route Frequency Provider Last Rate Last Dose  . 0.9 %  sodium chloride infusion   Intravenous PRN Terrilee FilesButler, Michael C, MD   Stopped at 08/12/19 1614  . ciprofloxacin (CIPRO) IVPB 400 mg  400 mg Intravenous Q12H Herby AbrahamBell, Michelle T, RPH      . dextrose 5 % and 0.9 % NaCl with KCl 20 mEq/L infusion   Intravenous Continuous Garba, Mohammad L, MD      . metroNIDAZOLE (FLAGYL) IVPB 500 mg  500 mg Intravenous Q8H Garba,  Mohammad L, MD      . morphine 2 MG/ML injection 2 mg  2 mg Intravenous Q4H PRN Jonelle Sidle, Mohammad L, MD      . ondansetron (ZOFRAN) tablet 4 mg  4 mg Oral Q6H PRN Elwyn Reach, MD       Or  . ondansetron (ZOFRAN) injection 4 mg  4 mg Intravenous Q6H PRN Gala Romney L, MD      . vancomycin (VANCOCIN) 50 mg/mL oral solution 125 mg  125 mg Oral QID Elwyn Reach, MD          Allergies  Allergen Reactions  . Triptans Anaphylaxis    REVIEW OF SYSTEMS: Skin:  No history of rash.  No history of abnormal moles. Infection:  Recent tooth ache, treated with augmentin Neurologic: Pseudotumor cerebri.  With migraines.  She was  originally diagnosed in [place].  She is now seen by [facility] - Dr. Gordy Clement - she has not seen them in over a year Cardiac:  No history of hypertension. No history of heart disease.  No history of prior cardiac catheterization.  No history of seeing a cardiologist. Pulmonary:  Does not smoke cigarettes.  No asthma or bronchitis.  No OSA/CPAP.  Endocrine:  No diabetes. No thyroid disease. Gastrointestinal:  See HPI Urologic:  Left kidney stones on CT scan Musculoskeletal: She has had fibromyalgia since at least 2010.  She sees Dr. Ernie Hew in [place].  She is on Zanaflex, which seems to help Hematologic:  No bleeding disorder.  No history of anemia.  Not anticoagulated. Psycho-social:  The patient is oriented.   The patient has no obvious psychologic or social impairment to understanding our conversation and plan.  SOCIAL and FAMILY HISTORY: Not married, but has "domestic partner" Public relations account executive. 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  PHYSICAL EXAM: BP 120/86 (BP Location: Right Arm)   Pulse (!) 120   Temp (!) 101.5 F (38.6 C) (Oral)   Resp 18   Ht 5\' 2"  (1.575 m)   Wt 102.1 kg   SpO2 100%   BMI 41.15 kg/m   General: Obese AA F who is alert. She is wearing a mask.  She does not feel good. Skin:  Inspection and palpation - no mass or rash. Eyes:  Conjunctiva and lids unremarkable.            Pupils are equal Ears, Nose, Mouth, and Throat:  Wearing a mask Neck: Supple. No mass, trachea midline.  No thyroid mass. Lymph Nodes:  No supraclavicular, cervical, or inguinal nodes. Lungs: Normal respiratory effort.  Clear to auscultation and symmetric breath sounds. Heart:  Palpation of the heart is normal.            Auscultation: Tachycaria.  No murmur or rub.  Abdomen: Soft. No mass.  No hernia.   She is tender in her left side and LLQ.  No RLQ tenderness.            . Rectal: Not done. Musculoskeletal:  Good muscle strength and ROM   in upper and lower extremities.  Neurologic:  Grossly intact to motor and sensory function. Psychiatric: Normal judgement and insight. Behavior is normal.            Oriented to time, person, place.   DATA REVIEWED, COUNSELING AND COORDINATION OF CARE: Epic notes reviewed. Counseling and coordination of care exceeded more than 50% of the time spent with patient. Total time spent with patient and charting:  45 minutes  Ovidio Kin, MD,  [facility], [place] [address]  [address]   [place], [place]    [address] Phone:  [phone] FAX:  [phone]

## 2019-08-12 NOTE — Progress Notes (Signed)
MD is aware of patient's vitals and MEWS score, MD has been to bedside to see patient and general surgery consult at bedside as well. Pt is stable and being monitored, new orders were placed and patient will continue to be monitored. Dawson Bills, RN

## 2019-08-12 NOTE — ED Notes (Signed)
Attempted to call report x 2; first call was not answered; second call was placed on hold for 5 minutes.

## 2019-08-12 NOTE — Progress Notes (Signed)
PT arrived from [facility] with temperature of 101.5 and heart rate of 120. MEWS was a 4 MD notified and new orders were placed.

## 2019-08-13 DIAGNOSIS — K219 Gastro-esophageal reflux disease without esophagitis: Secondary | ICD-10-CM

## 2019-08-13 DIAGNOSIS — G43809 Other migraine, not intractable, without status migrainosus: Secondary | ICD-10-CM

## 2019-08-13 DIAGNOSIS — E871 Hypo-osmolality and hyponatremia: Secondary | ICD-10-CM

## 2019-08-13 DIAGNOSIS — K625 Hemorrhage of anus and rectum: Secondary | ICD-10-CM

## 2019-08-13 LAB — CBC
HCT: 31.6 % — ABNORMAL LOW (ref 36.0–46.0)
Hemoglobin: 9.5 g/dL — ABNORMAL LOW (ref 12.0–15.0)
MCH: 21.6 pg — ABNORMAL LOW (ref 26.0–34.0)
MCHC: 30.1 g/dL (ref 30.0–36.0)
MCV: 71.8 fL — ABNORMAL LOW (ref 80.0–100.0)
Platelets: 336 10*3/uL (ref 150–400)
RBC: 4.4 MIL/uL (ref 3.87–5.11)
RDW: 16.5 % — ABNORMAL HIGH (ref 11.5–15.5)
WBC: 7.5 10*3/uL (ref 4.0–10.5)
nRBC: 0 % (ref 0.0–0.2)

## 2019-08-13 LAB — C DIFFICILE QUICK SCREEN W PCR REFLEX
C Diff antigen: NEGATIVE
C Diff interpretation: NOT DETECTED
C Diff toxin: NEGATIVE

## 2019-08-13 LAB — HEMOGLOBIN AND HEMATOCRIT, BLOOD
HCT: 34.3 % — ABNORMAL LOW (ref 36.0–46.0)
Hemoglobin: 10.3 g/dL — ABNORMAL LOW (ref 12.0–15.0)

## 2019-08-13 LAB — COMPREHENSIVE METABOLIC PANEL
ALT: 12 U/L (ref 0–44)
AST: 14 U/L — ABNORMAL LOW (ref 15–41)
Albumin: 2.5 g/dL — ABNORMAL LOW (ref 3.5–5.0)
Alkaline Phosphatase: 46 U/L (ref 38–126)
Anion gap: 8 (ref 5–15)
BUN: 6 mg/dL (ref 6–20)
CO2: 18 mmol/L — ABNORMAL LOW (ref 22–32)
Calcium: 7.6 mg/dL — ABNORMAL LOW (ref 8.9–10.3)
Chloride: 107 mmol/L (ref 98–111)
Creatinine, Ser: 0.63 mg/dL (ref 0.44–1.00)
GFR calc Af Amer: >60 mL/min (ref >60)
GFR calc non Af Amer: >60 mL/min (ref >60)
Glucose, Bld: 111 mg/dL — ABNORMAL HIGH (ref 70–99)
Potassium: 3.7 mmol/L (ref 3.5–5.1)
Sodium: 133 mmol/L — ABNORMAL LOW (ref 135–145)
Total Bilirubin: 0.5 mg/dL (ref 0.3–1.2)
Total Protein: 6.9 g/dL (ref 6.5–8.1)

## 2019-08-13 MED ORDER — SIMETHICONE 80 MG PO CHEW
80.0000 mg | CHEWABLE_TABLET | Freq: Four times a day (QID) | ORAL | Status: DC | PRN
  Administered 2019-08-13: 80 mg via ORAL
  Filled 2019-08-13: qty 1

## 2019-08-13 MED ORDER — RISAQUAD PO CAPS
2.0000 | ORAL_CAPSULE | Freq: Three times a day (TID) | ORAL | Status: DC
  Administered 2019-08-13 – 2019-08-16 (×8): 2 via ORAL
  Filled 2019-08-13 (×8): qty 2

## 2019-08-13 MED ORDER — ACETAMINOPHEN 325 MG PO TABS
650.0000 mg | ORAL_TABLET | Freq: Four times a day (QID) | ORAL | Status: DC | PRN
  Administered 2019-08-13 – 2019-08-17 (×8): 650 mg via ORAL
  Filled 2019-08-13 (×8): qty 2

## 2019-08-13 MED ORDER — SODIUM CHLORIDE 0.9 % IV BOLUS
1000.0000 mL | Freq: Once | INTRAVENOUS | Status: AC
  Administered 2019-08-13: 1000 mL via INTRAVENOUS

## 2019-08-13 NOTE — Significant Event (Signed)
Rapid Response Event Note  Overview: Time Called: 2030 Arrival Time: 2035 Event Type: Cardiac  Initial Focused Assessment: Patient lying in bed, awake and alert. Patient complaining of chest discomfort and shortness of breath. Upon auscultation patient states it hurts down to her fissure when I press my stethoscope on her chest. Patient complains of no other radiating pain or discomfort, other than her fissure discomfort. Patient explains that her breathing gets "tight" when her heart rate gets elevated. Patient tachycardic with a heart rate of 130-135 bpm. Patient febrile at 102.62F. Oxygen saturation 98% on room air, respiratory rate 20, no accessory muscle use, lung sounds are clear in all lung fields.   Interventions: EKG: ST, otherwise normal Ice packs to be placed under patient's arms, patient states it would hurt her fistula to place them on her groin. Tylenol given for fever at 2010. Blankets removed from patient's bed.  Plan of Care (if not transferred): Patient appears to have controlled her breathing and does not appear short of breath. Pain mainly seems to be originating at patient's fissure site.    RN to reassess patient's discomforts as fever resolves. RN to consult rapid response for further needs.   Event Summary:  Nancy Reeves

## 2019-08-13 NOTE — Progress Notes (Addendum)
PROGRESS NOTE    Nancy Reeves  ZOX:[id number]  DOB: 01-12-1981  DOA: 08/12/2019 PCP: Patient, No Pcp Per  Brief Narrative:  38 y.o. female with medical history significant of GERD, pseudotumor cerebri, morbid obesity, fibromyalgia who presented to the ER with abdominal pain, diarrhea for about 4 weeks.  Patient received Augmentin on August 22 for dental infection. She reports about 8 episodes of diarrhea/day with abdominal pain at 7/10. Work-up done showed colitis on CT scan, extending from the proximal descending colon through the distal sigmoid colon with ?possible appendicitis but no perforation or abscess.  Dr. Maisie Fushomas of surgery was contacted and [facility]. She got ceftriaxone metronidazole and saline in the ER.   ED Course: Temperature is 102.9 blood pressure 145/86 pulse 146 respirate of 20 oxygen sats 95% on room air.  White count 11.3 hemoglobin 10.9 and platelet 397. Sodium 129.  Calcium 8.0 with albumin 2.6 lipase of 17.  Urinalysis essentially negative.  Stool test is positive for C. difficile.  COVID-19 is negative. CT abdomen pelvis showed colitis ?pssobile appendicitis. Patient has been evaluated by GS who feel appendicitis is unlikely and recommended treatment for C.diff colitis.Patient admitted to hospitalist service with po vancomycin.   Subjective:  Patient reports bloody stools but improved in frequency.  She states she has hemorrhoids and anal fissure which tend to bleed on and off.  She quantifies rectal bleeding as mild.  She is complaining of bloating sensation and flatulence.  Reports rectal discomfort when passing gas  Objective: Vitals:   08/12/19 2323 08/13/19 0019 08/13/19 0120 08/13/19 0206  BP: (!) 102/58 118/66 108/64 103/64  Pulse: (!) 120 (!) 122 (!) 116 (!) 108  Resp: 18 18 16 16   Temp: (!) 101 F (38.3 C) 99.6 F (37.6 C) 100 F (37.8 C) 99.9 F (37.7 C)  TempSrc: Oral Oral Oral Oral  SpO2: 96% 99% 94% 97%  Weight:       Height:        Intake/Output Summary (Last 24 hours) at 08/13/2019 1119 Last data filed at 08/13/2019 1000 Gross per 24 hour  Intake 2825.18 ml  Output 500 ml  Net 2325.18 ml   Filed Weights   08/12/19 1127  Weight: 102.1 kg    Physical Examination:  General exam: Appears calm and comfortable  Respiratory system: Clear to auscultation. Respiratory effort normal. Cardiovascular system: S1 & S2 heard, RRR. No JVD, murmurs, rubs, gallops or clicks. No pedal edema. Gastrointestinal system: Abdomen is nondistended, soft, mild diffuse tenderness. No rebound or guarding. Normal bowel sounds heard. Central nervous system: Alert and oriented. No focal neurological deficits. Extremities: Symmetric 5 x 5 power. Skin: No rashes, lesions or ulcers Psychiatry: Judgement and insight appear normal. Mood & affect appropriate.     Data Reviewed: I have personally reviewed following labs and imaging studies  CBC: Recent Labs  Lab 08/12/19 1229 08/13/19 0337  WBC 11.3* 7.5  NEUTROABS 8.2*  --   HGB 10.9* 9.5*  HCT 36.0 31.6*  MCV 70.7* 71.8*  PLT 397 336   Basic Metabolic Panel: Recent Labs  Lab 08/12/19 1302 08/13/19 0337  NA 129* 133*  K 3.8 3.7  CL 98 107  CO2 19* 18*  GLUCOSE 85 111*  BUN 10 6  CREATININE 0.77 0.63  CALCIUM 8.0* 7.6*   GFR: Estimated Creatinine Clearance: 106.7 mL/min (by C-G formula based on SCr of 0.63 mg/dL). Liver Function Tests: Recent Labs  Lab 08/12/19 1302 08/13/19 0337  AST 17  14*  ALT 13 12  ALKPHOS 51 46  BILITOT 0.5 0.5  PROT 7.3 6.9  ALBUMIN 2.6* 2.5*   Recent Labs  Lab 08/12/19 1302  LIPASE 17   No results for input(s): AMMONIA in the last 168 hours. Coagulation Profile: No results for input(s): INR, PROTIME in the last 168 hours. Cardiac Enzymes: No results for input(s): CKTOTAL, CKMB, CKMBINDEX, TROPONINI in the last 168 hours. BNP (last 3 results) No results for input(s): PROBNP in the last 8760 hours. HbA1C: No  results for input(s): HGBA1C in the last 72 hours. CBG: No results for input(s): GLUCAP in the last 168 hours. Lipid Profile: No results for input(s): CHOL, HDL, LDLCALC, TRIG, CHOLHDL, LDLDIRECT in the last 72 hours. Thyroid Function Tests: No results for input(s): TSH, T4TOTAL, FREET4, T3FREE, THYROIDAB in the last 72 hours. Anemia Panel: No results for input(s): VITAMINB12, FOLATE, FERRITIN, TIBC, IRON, RETICCTPCT in the last 72 hours. Sepsis Labs: No results for input(s): PROCALCITON, LATICACIDVEN in the last 168 hours.  Recent Results (from the past 240 hour(s))  C Difficile Quick Screen w PCR reflex     Status: Abnormal   Collection Time: 08/12/19  1:43 PM   Specimen: Urine, Clean Catch; Stool  Result Value Ref Range Status   C Diff antigen POSITIVE (A) NEGATIVE Final   C Diff toxin NEGATIVE NEGATIVE Final   C Diff interpretation Results are indeterminate. See PCR results.  Final    Comment: Performed at [facility], [address], [place], [place] [address]  C. Diff by PCR, Reflexed     Status: Abnormal   Collection Time: 08/12/19  1:43 PM  Result Value Ref Range Status   Toxigenic C. Difficile by PCR POSITIVE (A) NEGATIVE Final    Comment: Positive for toxigenic C. difficile with little to no toxin production. Only treat if clinical presentation suggests symptomatic illness. Performed at [facility], [address] [place], [place] [address]   SARS Coronavirus 2 [facility] order, Performed in [facility] hospital lab) Nasopharyngeal Nasopharyngeal Swab     Status: None   Collection Time: 08/12/19  2:19 PM   Specimen: Nasopharyngeal Swab  Result Value Ref Range Status   SARS Coronavirus 2 NEGATIVE NEGATIVE Final    Comment: (NOTE) If result is NEGATIVE SARS-CoV-2 target nucleic acids are NOT DETECTED. The SARS-CoV-2 RNA is generally detectable in upper and lower  respiratory specimens during the acute phase of infection. The lowest  concentration of  SARS-CoV-2 viral copies this assay can detect is 250  copies / mL. A negative result does not preclude SARS-CoV-2 infection  and should not be used as the sole basis for treatment or other  patient management decisions.  A negative result may occur with  improper specimen collection / handling, submission of specimen other  than nasopharyngeal swab, presence of viral mutation(s) within the  areas targeted by this assay, and inadequate number of viral copies  (<250 copies / mL). A negative result must be combined with clinical  observations, patient history, and epidemiological information. If result is POSITIVE SARS-CoV-2 target nucleic acids are DETECTED. The SARS-CoV-2 RNA is generally detectable in upper and lower  respiratory specimens dur ing the acute phase of infection.  Positive  results are indicative of active infection with SARS-CoV-2.  Clinical  correlation with patient history and other diagnostic information is  necessary to determine patient infection status.  Positive results do  not rule out bacterial infection or co-infection with other viruses. If  result is PRESUMPTIVE POSTIVE SARS-CoV-2 nucleic acids MAY BE PRESENT.   A presumptive positive result was obtained on the submitted specimen  and confirmed on repeat testing.  While 2019 novel coronavirus  (SARS-CoV-2) nucleic acids may be present in the submitted sample  additional confirmatory testing may be necessary for epidemiological  and / or clinical management purposes  to differentiate between  SARS-CoV-2 and other Sarbecovirus currently known to infect humans.  If clinically indicated additional testing with an alternate test  methodology [phone]) is advised. The SARS-CoV-2 RNA is generally  detectable in upper and lower respiratory sp ecimens during the acute  phase of infection. The expected result is Negative. Fact Sheet for Patients:  BoilerBrush.com.cy Fact Sheet for Healthcare  Providers: https://pope.com/ This test is not yet approved or cleared by the [place] FDA and has been authorized for detection and/or diagnosis of SARS-CoV-2 by FDA under an Emergency Use Authorization (EUA).  This EUA will remain in effect (meaning this test can be used) for the duration of the COVID-19 declaration under Section 564(b)(1) of the Act, 21 U.S.C. section 360bbb-3(b)(1), unless the authorization is terminated or revoked sooner. Performed at [facility], [address] [place], [place] [address]   C difficile quick scan w PCR reflex     Status: None   Collection Time: 08/12/19  7:17 PM   Specimen: STOOL  Result Value Ref Range Status   C Diff antigen NEGATIVE NEGATIVE Final   C Diff toxin NEGATIVE NEGATIVE Final   C Diff interpretation No C. difficile detected.  Final    Comment: Performed at [facility], [address], [place], [place] [address]      Radiology Studies: Ct Abdomen Pelvis W Contrast  Result Date: 08/12/2019 CLINICAL DATA:  Abdo pain and diarrhea EXAM: CT ABDOMEN AND PELVIS WITH CONTRAST TECHNIQUE: Multidetector CT imaging of the abdomen and pelvis was performed using the standard protocol following bolus administration of intravenous contrast. Oral contrast was also administered. CONTRAST:  OMNIPAQUE IOHEXOL 300 MG/ML  SOLN COMPARISON:  None. FINDINGS: Lower chest: There is atelectatic change in the anterior left base. A lesser degree of atelectasis is noted in the posterior left base. Lungs bases elsewhere clear. Hepatobiliary: No focal liver lesions are demonstrable. The gallbladder wall is not appreciably thickened. There is no evident biliary duct dilatation. Pancreas: There is no pancreatic mass or inflammatory focus. Spleen: No splenic lesions are evident. Adrenals/Urinary Tract: Adrenals bilaterally appear normal. Kidneys bilaterally show no evident mass or hydronephrosis on either  side. There is a 1 mm calculus in the upper pole left kidney. A second 1 mm calculus is noted in the mid left kidney. There is no evident ureteral calculus on either side. Urinary bladder is midline with wall thickness within normal limits. Stomach/Bowel: There is wall thickening throughout the descending colon arising slightly distal to the sigmoid colon with involvement of the remainder of the descending colon and sigmoid colon to the level of the rectum. Rectum appears unremarkable. No appreciable diverticular disease is noted in this area. There is slight soft tissue stranding immediately adjacent to the left colon at the sites of wall thickening. Elsewhere, there is no appreciable bowel wall thickening. No bowel obstruction evident. No free air or portal venous air. The terminal ileum appears unremarkable. No evident intramural air. Vascular/Lymphatic: No abdominal aortic aneurysm. No vascular lesions are evident. Mesenteric arterial vessels appear widely patent. There is no adenopathy by size criteria in the abdomen or  pelvis. There are scattered subcentimeter mesenteric lymph nodes, primarily in the right abdomen. Reproductive: The uterus is anteverted, slightly canted to the right. No pelvic masses evident. Other: Appendix appears prominent measuring 9 mm in thickness. There is no appreciable appendiceal wall thickening or enhancement. There is no periappendiceal region fluid. There is equivocal soft tissue stranding in this area. There is no abscess or ascites in the abdomen or pelvis. Musculoskeletal: No blastic or lytic bone lesions. No intramuscular or abdominal wall lesions are evident. IMPRESSION: 1. The appendix measures 9 mm in thickness which is enlarged. There is equivocal soft tissue stranding in the region of the appendix. This appearance must be viewed as concerning for early acute appendiceal inflammation. No appendiceal abscess or air. Several subcentimeter lymph nodes are noted in this area  which may well have inflammatory etiology. Surgical consultation with respect to the appearance of the right lower quadrant is warranted. Appendix: Location: Appendix arises inferiorly from the cecum and extends inferiorly and medially in the upper right pelvis. Diameter: 9 mm Appendicolith: None Mucosal hyper-enhancement: None Extraluminal gas: None Periappendiceal collection: None. Slight periappendiceal soft tissue stranding noted. 2. There is colitis extending from the proximal descending colon through the distal sigmoid colon without associated perforation or abscess. No diverticular disease noted in this area. Etiology for this colitis uncertain. Infectious colitis statistically is the most likely etiology for this finding. 3. 1 mm calculi in the left kidney. No ureteral calculus or hydronephrosis on either side. Urinary bladder wall thickness within normal limits. Critical Value/emergent results were called by telephone at the time of interpretation on 08/12/2019 at 1:18 pm to providerMICHAEL BUTLER , who verbally acknowledged these results. Electronically Signed   By: Lowella Grip III M.D.   On: 08/12/2019 13:18        Scheduled Meds: . vancomycin  125 mg Oral QID   Continuous Infusions: . sodium chloride Stopped (08/12/19 1614)  . ciprofloxacin 400 mg (08/13/19 0957)  . dextrose 5 % and 0.9 % NaCl with KCl 20 mEq/L 125 mL/hr at 08/13/19 0955  . metronidazole 500 mg (08/13/19 0533)    Assessment & Plan:    1.  C. difficile colitis: Continue oral vancomycin.  DC IV Cipro and Flagyl.  Continue IV fluids, advance diet as tolerated.  Full liquids today.  Probiotics and simethicone ordered given complaints of bloating/flatulence.  2.  Rectal bleeding: In the setting of problem #1 and history of anal fissure/hemorrhoids.  Hemoglobin did drop to 9.5.  Monitor closely and transfuse as needed.  Consider GI evaluation if worsens.  3.  Abnormal CT scan: Raising suspicion for appendicitis.   Seen by general surgery and cleared with recommendations of no surgical intervention.  4.  GERD: Avoid PPI with problem #1  5.  History of migraine/pseudotumor cerebri: [facility] neurology.  No complaints of headache currently  6.  Hyponatremia: Likely hypovolemic hyponatremia in the setting of problem #1.  Improving with IV fluids.  DVT prophylaxis: Avoid anticoagulants with problem #2.  She has been ambulating to the bathroom Code Status: Full code Family / Patient Communication: Discussed with patient and all questions answered Disposition Plan: Home when medically cleared     LOS: 1 day    Time spent:     Guilford Shi, MD [facility] Pager [phone]  If 7PM-7AM, please contact night-coverage [email or web address] Password [email or web address] 08/13/2019, 11:19 AM

## 2019-08-13 NOTE — Progress Notes (Addendum)
  Pt had MEWs of yellow from earlier in day due to temp and pule. Pt c/o of chills, vitals rechecked and vitals and pt currently has RED MEWs due to pulse of 140 and temp of 102.9. MD on call called and notified, protocol started and order given for Nacl bolus of 102ml/Hr. Pt being monitored and Rapid called. Dawson Bills, RN

## 2019-08-13 NOTE — Progress Notes (Addendum)
   08/13/19 2010  Vitals  Temp (!) 103.2 F (39.6 C)  Temp Source Oral  BP 116/81  MAP (mmHg) 94  BP Location Right Arm  BP Method Automatic  Patient Position (if appropriate) Lying  Pulse Rate (!) 138  Resp 20  Oxygen Therapy  SpO2 97 %  O2 Device Room Air   Rapid called, MEWS of red pt c/o SOB. Tilia Faso, RN  EKG done and pt has NSR, patient was seen by rapid nurse and NP on call, patient is currently stable and being monitored, pt will continue to be monitored throughout shift. Dawson Bills, RN

## 2019-08-13 NOTE — H&P (Signed)
History and Physical   Nancy Reeves JYN:[id number] DOB: 1981/06/09 DOA: 08/12/2019  Referring MD/NP/PA: [facility]  PCP: Patient, No Pcp Per   Outpatient Specialists: None  Patient coming from: [facility]  Chief Complaint: Abdominal  HPI: Nancy Reeves is a 38 y.o. female with medical history significant of GERD, pseudotumor cerebri, morbid obesity, fibromyalgia who presented to the ER with abdominal pain and diarrhea which is been going on and off for about 4 weeks.  Patient was having dental infection and received Augmentin on August 22.  She took it for 3 days.  Thereafter she started having diarrhea with abdominal cramping.  She was back-and-forth to the doctor and to [facility] where she was seen on September 2.  Patient was evaluated treated and sent home.  She returns tonight due to worsening symptoms.  She was found to be tachycardic and ongoing diarrhea.  She reported about 8 episodes of stools today mostly watery.  Also abdominal pain at 7 out of 10.  No fever or chills.  Patient denied any sick contacts.  Work-up done showed colitis on CT scan.  This is extending from the proximal descending colon through the distal sigmoid colon no perforation or abscess.  Dr. Maisie Fushomas of surgery was called and patient is sent over.  She got ceftriaxone metronidazole and saline in the ER.  Patient however is now found to have C. difficile in her stool.  ED Course: Temperature is 102.9 blood pressure 145/86 pulse 146 respirate of 20 oxygen sats 95% on room air.  White count 11.3 hemoglobin 10.9 and platelet 397.  Normal differentials except for neutrophils 8.2.  Sodium 129.  Calcium 8.0 with albumin 2.6 lipase of 17.  Urinalysis essentially negative.  Stool test is positive for C. difficile.  COVID-19 is negative. CT abdomen pelvis showed colitis extending from the proximal descending colon to distal sigmoid colon no perforation or abscess.  No overt diverticular disease.  Patient is  being admitted with C. difficile colitis and early sepsis from that  Review of Systems: As per HPI otherwise 10 point review of systems negative.    Past Medical History:  Diagnosis Date   Fibromyalgia    GERD (gastroesophageal reflux disease)    Migraine    psuedotumor cerebra   Pseudotumor cerebri     Past Surgical History:  Procedure Laterality Date   LUMBAR PUNCTURE       reports that she has never smoked. She has never used smokeless tobacco. She reports previous alcohol use. She reports that she does not use drugs.  Allergies  Allergen Reactions   Triptans Anaphylaxis    No family history on file.   Prior to Admission medications   Medication Sig Start Date End Date Taking? Authorizing Provider  famotidine (PEPCID) 40 MG tablet Take 40 mg by mouth daily.    Yes [provider]  fluconazole (DIFLUCAN) 100 MG tablet Take 100 mg by mouth daily.  08/06/19  Yes [provider]  hyoscyamine (LEVBID) 0.375 MG 12 hr tablet Take 0.375 mg by mouth 2 (two) times daily.  08/08/19  Yes [provider]  tiZANidine (ZANAFLEX) 4 MG capsule Take 4 mg by mouth daily.  08/03/19 09/02/19 Yes [provider]  albuterol (VENTOLIN HFA) 108 (90 Base) MCG/ACT inhaler Inhale 1 puff into the lungs every 4 (four) hours. 04/22/19   [provider]  medroxyPROGESTERone (PROVERA) 10 MG tablet Take 10 mg by mouth daily.  07/08/19   [provider]  metFORMIN (GLUCOPHAGE-XR) 500 MG 24 hr tablet Take 500 mg by mouth at bedtime. 07/08/19   [provider]  DULoxetine (CYMBALTA) 60 MG capsule Take 60 mg by mouth daily.  07/19/19  [provider]  gabapentin (NEURONTIN) 300 MG capsule Take 300 mg by mouth 3 (three) times daily.  07/19/19  [provider]    Physical Exam: Vitals:   08/12/19 2134 08/12/19 2145 08/12/19 2221 08/12/19 2323  BP: 122/73 127/76 (!) 123/55 (!) 102/58  Pulse: (!) 121 (!) 120 (!) 117 (!) 120  Resp:  18 18 18 18   Temp: (!) 100.9 F (38.3 C) (!) 101.2 F (38.4 C) (!) 102.1 F (38.9 C) (!) 101 F (38.3 C)  TempSrc: Oral Oral Oral Oral  SpO2: 98% 98% 99% 96%  Weight:      Height:          Constitutional: Pleasant, anxious, no acute distress Vitals:   08/12/19 2134 08/12/19 2145 08/12/19 2221 08/12/19 2323  BP: 122/73 127/76 (!) 123/55 (!) 102/58  Pulse: (!) 121 (!) 120 (!) 117 (!) 120  Resp: 18 18 18 18   Temp: (!) 100.9 F (38.3 C) (!) 101.2 F (38.4 C) (!) 102.1 F (38.9 C) (!) 101 F (38.3 C)  TempSrc: Oral Oral Oral Oral  SpO2: 98% 98% 99% 96%  Weight:      Height:       Eyes: PERRL, lids and conjunctivae normal ENMT: Mucous membranes are dry. Posterior pharynx clear of any exudate or lesions.Normal dentition.  Neck: normal, supple, no masses, no thyromegaly Respiratory: clear to auscultation bilaterally, no wheezing, no crackles. Normal respiratory effort. No accessory muscle use.  Cardiovascular: Regular rate and rhythm, no murmurs / rubs / gallops. No extremity edema. 2+ pedal pulses. No carotid bruits.  Abdomen: Left lower quadrant abdominal tenderness no masses palpated. No hepatosplenomegaly. Bowel sounds positive.  Musculoskeletal: no clubbing / cyanosis. No joint deformity upper and lower extremities. Good ROM, no contractures. Normal muscle tone.  Skin: no rashes, lesions, ulcers. No induration Neurologic: CN 2-12 grossly intact. Sensation intact, DTR normal. Strength 5/5 in all 4.  Psychiatric: Normal judgment and insight. Alert and oriented x 3. Normal mood.     Labs on Admission: I have personally reviewed following labs and imaging studies  CBC: Recent Labs  Lab 08/12/19 1229  WBC 11.3*  NEUTROABS 8.2*  HGB 10.9*  HCT 36.0  MCV 70.7*  PLT 053   Basic Metabolic Panel: Recent Labs  Lab 08/12/19 1302  NA 129*  K 3.8  CL 98  CO2 19*  GLUCOSE 85  BUN 10  CREATININE 0.77  CALCIUM 8.0*   GFR: Estimated Creatinine Clearance: 106.7 mL/min  (by C-G formula based on SCr of 0.77 mg/dL). Liver Function Tests: Recent Labs  Lab 08/12/19 1302  AST 17  ALT 13  ALKPHOS 51  BILITOT 0.5  PROT 7.3  ALBUMIN 2.6*   Recent Labs  Lab 08/12/19 1302  LIPASE 17   No results for input(s): AMMONIA in the last 168 hours. Coagulation Profile: No results for input(s): INR, PROTIME in the last 168 hours. Cardiac Enzymes: No results for input(s): CKTOTAL, CKMB, CKMBINDEX, TROPONINI in the last 168 hours. BNP (last 3 results) No results for input(s): PROBNP in the last 8760 hours. HbA1C: No results for input(s): HGBA1C in the last 72 hours. CBG: No results for input(s): GLUCAP in the last 168 hours. Lipid Profile: No results for input(s): CHOL, HDL, LDLCALC, TRIG, CHOLHDL, LDLDIRECT in  the last 72 hours. Thyroid Function Tests: No results for input(s): TSH, T4TOTAL, FREET4, T3FREE, THYROIDAB in the last 72 hours. Anemia Panel: No results for input(s): VITAMINB12, FOLATE, FERRITIN, TIBC, IRON, RETICCTPCT in the last 72 hours. Urine analysis:    Component Value Date/Time   COLORURINE YELLOW 08/12/2019 1343   APPEARANCEUR CLEAR 08/12/2019 1343   LABSPEC 1.010 08/12/2019 1343   PHURINE 5.5 08/12/2019 1343   GLUCOSEU NEGATIVE 08/12/2019 1343   HGBUR TRACE (A) 08/12/2019 1343   BILIRUBINUR NEGATIVE 08/12/2019 1343   KETONESUR >80 (A) 08/12/2019 1343   PROTEINUR NEGATIVE 08/12/2019 1343   UROBILINOGEN 1.0 01/31/2011 1134   NITRITE NEGATIVE 08/12/2019 1343   LEUKOCYTESUR NEGATIVE 08/12/2019 1343   Sepsis Labs: @LABRCNTIP (procalcitonin:4,lacticidven:4) ) Recent Results (from the past 240 hour(s))  C Difficile Quick Screen w PCR reflex     Status: Abnormal   Collection Time: 08/12/19  1:43 PM   Specimen: Urine, Clean Catch; Stool  Result Value Ref Range Status   C Diff antigen POSITIVE (A) NEGATIVE Final   C Diff toxin NEGATIVE NEGATIVE Final   C Diff interpretation Results are indeterminate. See PCR results.  Final    Comment:  Performed at [facility], [address], [place], [place]  C. Diff by PCR, Reflexed     Status: Abnormal   Collection Time: 08/12/19  1:43 PM  Result Value Ref Range Status   Toxigenic C. Difficile by PCR POSITIVE (A) NEGATIVE Final    Comment: Positive for toxigenic C. difficile with little to no toxin production. Only treat if clinical presentation suggests symptomatic illness. Performed at [facility], [address] [place], [place]   SARS Coronavirus 2 [facility] order, Performed in [facility] hospital lab) Nasopharyngeal Nasopharyngeal Swab     Status: None   Collection Time: 08/12/19  2:19 PM   Specimen: Nasopharyngeal Swab  Result Value Ref Range Status   SARS Coronavirus 2 NEGATIVE NEGATIVE Final    Comment: (NOTE) If result is NEGATIVE SARS-CoV-2 target nucleic acids are NOT DETECTED. The SARS-CoV-2 RNA is generally detectable in upper and lower  respiratory specimens during the acute phase of infection. The lowest  concentration of SARS-CoV-2 viral copies this assay can detect is 250  copies / mL. A negative result does not preclude SARS-CoV-2 infection  and should not be used as the sole basis for treatment or other  patient management decisions.  A negative result may occur with  improper specimen collection / handling, submission of specimen other  than nasopharyngeal swab, presence of viral mutation(s) within the  areas targeted by this assay, and inadequate number of viral copies  (<250 copies / mL). A negative result must be combined with clinical  observations, patient history, and epidemiological information. If result is POSITIVE SARS-CoV-2 target nucleic acids are DETECTED. The SARS-CoV-2 RNA is generally detectable in upper and lower  respiratory specimens dur ing the acute phase of infection.  Positive  results are indicative of active infection with SARS-CoV-2.  Clinical  correlation with patient history and other diagnostic  information is  necessary to determine patient infection status.  Positive results do  not rule out bacterial infection or co-infection with other viruses. If result is PRESUMPTIVE POSTIVE SARS-CoV-2 nucleic acids MAY BE PRESENT.   A presumptive positive result was obtained on the submitted specimen  and confirmed on repeat testing.  While 2019 novel coronavirus  (SARS-CoV-2) nucleic acids may be present in the submitted sample  additional confirmatory  testing may be necessary for epidemiological  and / or clinical management purposes  to differentiate between  SARS-CoV-2 and other Sarbecovirus currently known to infect humans.  If clinically indicated additional testing with an alternate test  methodology [phone]) is advised. The SARS-CoV-2 RNA is generally  detectable in upper and lower respiratory sp ecimens during the acute  phase of infection. The expected result is Negative. Fact Sheet for Patients:  BoilerBrush.com.cy Fact Sheet for Healthcare Providers: https://pope.com/ This test is not yet approved or cleared by the [place] FDA and has been authorized for detection and/or diagnosis of SARS-CoV-2 by FDA under an Emergency Use Authorization (EUA).  This EUA will remain in effect (meaning this test can be used) for the duration of the COVID-19 declaration under Section 564(b)(1) of the Act, 21 U.S.C. section 360bbb-3(b)(1), unless the authorization is terminated or revoked sooner. Performed at [facility], [address] [place], [place] [address]      Radiological Exams on Admission: Ct Abdomen Pelvis W Contrast  Result Date: 08/12/2019 CLINICAL DATA:  Abdo pain and diarrhea EXAM: CT ABDOMEN AND PELVIS WITH CONTRAST TECHNIQUE: Multidetector CT imaging of the abdomen and pelvis was performed using the standard protocol following bolus administration of intravenous contrast. Oral contrast was also  administered. CONTRAST:  OMNIPAQUE IOHEXOL 300 MG/ML  SOLN COMPARISON:  None. FINDINGS: Lower chest: There is atelectatic change in the anterior left base. A lesser degree of atelectasis is noted in the posterior left base. Lungs bases elsewhere clear. Hepatobiliary: No focal liver lesions are demonstrable. The gallbladder wall is not appreciably thickened. There is no evident biliary duct dilatation. Pancreas: There is no pancreatic mass or inflammatory focus. Spleen: No splenic lesions are evident. Adrenals/Urinary Tract: Adrenals bilaterally appear normal. Kidneys bilaterally show no evident mass or hydronephrosis on either side. There is a 1 mm calculus in the upper pole left kidney. A second 1 mm calculus is noted in the mid left kidney. There is no evident ureteral calculus on either side. Urinary bladder is midline with wall thickness within normal limits. Stomach/Bowel: There is wall thickening throughout the descending colon arising slightly distal to the sigmoid colon with involvement of the remainder of the descending colon and sigmoid colon to the level of the rectum. Rectum appears unremarkable. No appreciable diverticular disease is noted in this area. There is slight soft tissue stranding immediately adjacent to the left colon at the sites of wall thickening. Elsewhere, there is no appreciable bowel wall thickening. No bowel obstruction evident. No free air or portal venous air. The terminal ileum appears unremarkable. No evident intramural air. Vascular/Lymphatic: No abdominal aortic aneurysm. No vascular lesions are evident. Mesenteric arterial vessels appear widely patent. There is no adenopathy by size criteria in the abdomen or pelvis. There are scattered subcentimeter mesenteric lymph nodes, primarily in the right abdomen. Reproductive: The uterus is anteverted, slightly canted to the right. No pelvic masses evident. Other: Appendix appears prominent measuring 9 mm in thickness. There is  no appreciable appendiceal wall thickening or enhancement. There is no periappendiceal region fluid. There is equivocal soft tissue stranding in this area. There is no abscess or ascites in the abdomen or pelvis. Musculoskeletal: No blastic or lytic bone lesions. No intramuscular or abdominal wall lesions are evident. IMPRESSION: 1. The appendix measures 9 mm in thickness which is enlarged. There is equivocal soft tissue stranding in the region of the appendix. This appearance must be viewed as concerning for early acute appendiceal inflammation.  No appendiceal abscess or air. Several subcentimeter lymph nodes are noted in this area which may well have inflammatory etiology. Surgical consultation with respect to the appearance of the right lower quadrant is warranted. Appendix: Location: Appendix arises inferiorly from the cecum and extends inferiorly and medially in the upper right pelvis. Diameter: 9 mm Appendicolith: None Mucosal hyper-enhancement: None Extraluminal gas: None Periappendiceal collection: None. Slight periappendiceal soft tissue stranding noted. 2. There is colitis extending from the proximal descending colon through the distal sigmoid colon without associated perforation or abscess. No diverticular disease noted in this area. Etiology for this colitis uncertain. Infectious colitis statistically is the most likely etiology for this finding. 3. 1 mm calculi in the left kidney. No ureteral calculus or hydronephrosis on either side. Urinary bladder wall thickness within normal limits. Critical Value/emergent results were called by telephone at the time of interpretation on 08/12/2019 at 1:18 pm to providerMICHAEL BUTLER , who verbally acknowledged these results. Electronically Signed   By: Bretta Bang III M.D.   On: 08/12/2019 13:18    EKG: Independently reviewed.  Sinus tachycardia  Assessment/Plan Principal Problem:   Colitis Active Problems:   Hyponatremia   Leucocytosis     #1  C. difficile colitis: Patient is colitis is extensive.  She has been on Cipro and Flagyl IV.  I will continue for another day.  Most significantly I will treat with oral vancomycin for C. difficile colitis.  Clear liquid diet.  Pain control.  Nausea control as needed.  Transition to home regimen once diarrhea has slowed down to 3 or less a day.  I do not see any surgical need for the patient at the moment  #2 hyponatremia: Most likely due to dehydration.  Aggressively hydrate patient and monitor.  #3 leukocytosis: Most likely from colitis.  Continue to monitor  #4 GERD: PPIs.   DVT prophylaxis: SCD Code Status: Full code Family Communication: No family at bedside Disposition Plan: Home Consults called: Dr. Maisie Fus of surgery called by ER Admission status: Inpatient  Severity of Illness: The appropriate patient status for this patient is INPATIENT. Inpatient status is judged to be reasonable and necessary in order to provide the required intensity of service to ensure the patient's safety. The patient's presenting symptoms, physical exam findings, and initial radiographic and laboratory data in the context of their chronic comorbidities is felt to place them at high risk for further clinical deterioration. Furthermore, it is not anticipated that the patient will be medically stable for discharge from the hospital within 2 midnights of admission. The following factors support the patient status of inpatient.   " The patient's presenting symptoms include abdominal pain or diarrhea. " The worrisome physical exam findings include abdominal tenderness. " The initial radiographic and laboratory data are worrisome because of evidence of C. difficile colitis with significant involvement of the colon. " The chronic co-morbidities include GERD.   * I certify that at the point of admission it is my clinical judgment that the patient will require inpatient hospital care spanning beyond 2 midnights from  the point of admission due to high intensity of service, high risk for further deterioration and high frequency of surveillance required.Lonia Blood MD [facility] Pager [phone]  If 7PM-7AM, please contact night-coverage [email or web address] Password [email or web address]  08/13/2019, 12:01 AM

## 2019-08-13 NOTE — Progress Notes (Signed)
Pt had MEWs of yellow at start of shift due to temp and pulse

## 2019-08-13 NOTE — Final Consult Note (Signed)
Consultant Final Sign-Off Note    Assessment/Final recommendations  Nancy Reeves is a 38 y.o. female followed by [facility] for abnormal imaging of appendix and concern for appendicitis   Wound care (if applicable): none   Diet at discharge: per primary team   Activity at discharge: per primary team   Follow-up appointment:  prn   Pending results:  Unresulted Labs (From admission, onward)    Start     Ordered   08/12/19 1916  HIV Antibody  (Routine Testing)  Once,   R     08/12/19 1918   08/12/19 1155  GI pathogen panel by PCR, stool  (Gastrointestinal Panel by PCR, Stool                                                                                                                                                     *Does Not include CLOSTRIDIUM DIFFICILE testing.**If CDIFF testing is needed, select the C Difficile Quick Screen w PCR reflex order below)  Once,   STAT     08/12/19 1155           Medication recommendations:   Other recommendations: No signs of acute appendicitis on exam.  Would recommend f/u CT imaging of the abd/pelvis in 2-3 months to re-evaluate appendix   Thank you for allowing [place] to participate in the care of your patient!  Please consult [place] again if you have further needs for your patient.  [name] 08/13/2019 9:16 AM    Subjective   C/o L sided abd pain  Objective  Vital signs in last 24 hours: Temp:  [98.9 F (37.2 C)-102.9 F (39.4 C)] 99.9 F (37.7 C) (09/25 0206) Pulse Rate:  [108-146] 108 (09/25 0206) Resp:  [16-20] 16 (09/25 0206) BP: (102-145)/(55-92) 103/64 (09/25 0206) SpO2:  [94 %-100 %] 97 % (09/25 0206) Weight:  [102.1 kg] 102.1 kg (09/24 1127)  General: NAD No TTP in RLQ   Pertinent labs and Studies: Recent Labs    08/12/19 1229 08/13/19 0337  WBC 11.3* 7.5  HGB 10.9* 9.5*  HCT 36.0 31.6*   BMET Recent Labs    08/12/19 1302 08/13/19 0337  NA 129* 133*  K 3.8 3.7  CL 98 107  CO2 19* 18*  GLUCOSE 85 111*   BUN 10 6  CREATININE 0.77 0.63  CALCIUM 8.0* 7.6*   No results for input(s): LABURIN in the last 72 hours. Results for orders placed or performed during the hospital encounter of 08/12/19  C Difficile Quick Screen w PCR reflex     Status: Abnormal   Collection Time: 08/12/19  1:43 PM   Specimen: Urine, Clean Catch; Stool  Result Value Ref Range Status   C Diff antigen POSITIVE (A) NEGATIVE Final   C Diff toxin NEGATIVE NEGATIVE Final   C Diff interpretation Results are  indeterminate. See PCR results.  Final    Comment: Performed at [facility], [address] [place], [place] [address]  C. Diff by PCR, Reflexed     Status: Abnormal   Collection Time: 08/12/19  1:43 PM  Result Value Ref Range Status   Toxigenic C. Difficile by PCR POSITIVE (A) NEGATIVE Final    Comment: Positive for toxigenic C. difficile with little to no toxin production. Only treat if clinical presentation suggests symptomatic illness. Performed at [facility], [address] [place], [place] [address]   SARS Coronavirus 2 [facility](Hospital order, Performed in [facility] hospital lab) Nasopharyngeal Nasopharyngeal Swab     Status: None   Collection Time: 08/12/19  2:19 PM   Specimen: Nasopharyngeal Swab  Result Value Ref Range Status   SARS Coronavirus 2 NEGATIVE NEGATIVE Final    Comment: (NOTE) If result is NEGATIVE SARS-CoV-2 target nucleic acids are NOT DETECTED. The SARS-CoV-2 RNA is generally detectable in upper and lower  respiratory specimens during the acute phase of infection. The lowest  concentration of SARS-CoV-2 viral copies this assay can detect is 250  copies / mL. A negative result does not preclude SARS-CoV-2 infection  and should not be used as the sole basis for treatment or other  patient management decisions.  A negative result may occur with  improper specimen collection / handling, submission of specimen other  than nasopharyngeal swab, presence of viral mutation(s) within the   areas targeted by this assay, and inadequate number of viral copies  (<250 copies / mL). A negative result must be combined with clinical  observations, patient history, and epidemiological information. If result is POSITIVE SARS-CoV-2 target nucleic acids are DETECTED. The SARS-CoV-2 RNA is generally detectable in upper and lower  respiratory specimens dur ing the acute phase of infection.  Positive  results are indicative of active infection with SARS-CoV-2.  Clinical  correlation with patient history and other diagnostic information is  necessary to determine patient infection status.  Positive results do  not rule out bacterial infection or co-infection with other viruses. If result is PRESUMPTIVE POSTIVE SARS-CoV-2 nucleic acids MAY BE PRESENT.   A presumptive positive result was obtained on the submitted specimen  and confirmed on repeat testing.  While 2019 novel coronavirus  (SARS-CoV-2) nucleic acids may be present in the submitted sample  additional confirmatory testing may be necessary for epidemiological  and / or clinical management purposes  to differentiate between  SARS-CoV-2 and other Sarbecovirus currently known to infect humans.  If clinically indicated additional testing with an alternate test  methodology [phone]([id number]) is advised. The SARS-CoV-2 RNA is generally  detectable in upper and lower respiratory sp ecimens during the acute  phase of infection. The expected result is Negative. Fact Sheet for Patients:  BoilerBrush.com.cyhttps://www.fda.gov/media/136312/download Fact Sheet for Healthcare Providers: https://pope.com/https://www.fda.gov/media/136313/download This test is not yet approved or cleared by the [place] FDA and has been authorized for detection and/or diagnosis of SARS-CoV-2 by FDA under an Emergency Use Authorization (EUA).  This EUA will remain in effect (meaning this test can be used) for the duration of the COVID-19 declaration under Section 564(b)(1) of the Act, 21  U.S.C. section 360bbb-3(b)(1), unless the authorization is terminated or revoked sooner. Performed at [facility], [address] [place], [place] [address]   C difficile quick scan w PCR reflex     Status: None   Collection Time: 08/12/19  7:17 PM   Specimen: STOOL  Result  Value Ref Range Status   C Diff antigen NEGATIVE NEGATIVE Final   C Diff toxin NEGATIVE NEGATIVE Final   C Diff interpretation No C. difficile detected.  Final    Comment: Performed at [facility], [address] [place], [place] [address]    Imaging: Ct Abdomen Pelvis W Contrast  Result Date: 08/12/2019 CLINICAL DATA:  Abdo pain and diarrhea EXAM: CT ABDOMEN AND PELVIS WITH CONTRAST TECHNIQUE: Multidetector CT imaging of the abdomen and pelvis was performed using the standard protocol following bolus administration of intravenous contrast. Oral contrast was also administered. CONTRAST:  OMNIPAQUE IOHEXOL 300 MG/ML  SOLN COMPARISON:  None. FINDINGS: Lower chest: There is atelectatic change in the anterior left base. A lesser degree of atelectasis is noted in the posterior left base. Lungs bases elsewhere clear. Hepatobiliary: No focal liver lesions are demonstrable. The gallbladder wall is not appreciably thickened. There is no evident biliary duct dilatation. Pancreas: There is no pancreatic mass or inflammatory focus. Spleen: No splenic lesions are evident. Adrenals/Urinary Tract: Adrenals bilaterally appear normal. Kidneys bilaterally show no evident mass or hydronephrosis on either side. There is a 1 mm calculus in the upper pole left kidney. A second 1 mm calculus is noted in the mid left kidney. There is no evident ureteral calculus on either side. Urinary bladder is midline with wall thickness within normal limits. Stomach/Bowel: There is wall thickening throughout the descending colon arising slightly distal to the sigmoid colon with involvement of the remainder of the descending  colon and sigmoid colon to the level of the rectum. Rectum appears unremarkable. No appreciable diverticular disease is noted in this area. There is slight soft tissue stranding immediately adjacent to the left colon at the sites of wall thickening. Elsewhere, there is no appreciable bowel wall thickening. No bowel obstruction evident. No free air or portal venous air. The terminal ileum appears unremarkable. No evident intramural air. Vascular/Lymphatic: No abdominal aortic aneurysm. No vascular lesions are evident. Mesenteric arterial vessels appear widely patent. There is no adenopathy by size criteria in the abdomen or pelvis. There are scattered subcentimeter mesenteric lymph nodes, primarily in the right abdomen. Reproductive: The uterus is anteverted, slightly canted to the right. No pelvic masses evident. Other: Appendix appears prominent measuring 9 mm in thickness. There is no appreciable appendiceal wall thickening or enhancement. There is no periappendiceal region fluid. There is equivocal soft tissue stranding in this area. There is no abscess or ascites in the abdomen or pelvis. Musculoskeletal: No blastic or lytic bone lesions. No intramuscular or abdominal wall lesions are evident. IMPRESSION: 1. The appendix measures 9 mm in thickness which is enlarged. There is equivocal soft tissue stranding in the region of the appendix. This appearance must be viewed as concerning for early acute appendiceal inflammation. No appendiceal abscess or air. Several subcentimeter lymph nodes are noted in this area which may well have inflammatory etiology. Surgical consultation with respect to the appearance of the right lower quadrant is warranted. Appendix: Location: Appendix arises inferiorly from the cecum and extends inferiorly and medially in the upper right pelvis. Diameter: 9 mm Appendicolith: None Mucosal hyper-enhancement: None Extraluminal gas: None Periappendiceal collection: None. Slight periappendiceal  soft tissue stranding noted. 2. There is colitis extending from the proximal descending colon through the distal sigmoid colon without associated perforation or abscess. No diverticular disease noted in this area. Etiology for this colitis uncertain. Infectious colitis statistically is the most likely etiology for this finding. 3. 1 mm calculi in  the left kidney. No ureteral calculus or hydronephrosis on either side. Urinary bladder wall thickness within normal limits. Critical Value/emergent results were called by telephone at the time of interpretation on 08/12/2019 at 1:18 pm to providerMICHAEL BUTLER , who verbally acknowledged these results. Electronically Signed   By: Lowella Grip III M.D.   On: 08/12/2019 13:18

## 2019-08-13 NOTE — Progress Notes (Signed)
Called regarding a MEWS score in the red. On bedside assessment, pt is awake, alert and oriented sitting up in bed. She states that she was feeling SOB and had a feverapproximately 15 minutes ago but now symptoms have resolved. She continues to abdominal pain/discomfort and pain near rectum. Fever has come down to 100.58F. Will continue to monitor and pursue current treatment course.    Lovey Newcomer, NP [facility] 7p-7a [phone]

## 2019-08-14 ENCOUNTER — Inpatient Hospital Stay (HOSPITAL_COMMUNITY): Payer: BC Managed Care – PPO

## 2019-08-14 LAB — CBC
HCT: 30.4 % — ABNORMAL LOW (ref 36.0–46.0)
Hemoglobin: 9.4 g/dL — ABNORMAL LOW (ref 12.0–15.0)
MCH: 21.8 pg — ABNORMAL LOW (ref 26.0–34.0)
MCHC: 30.9 g/dL (ref 30.0–36.0)
MCV: 70.4 fL — ABNORMAL LOW (ref 80.0–100.0)
Platelets: 350 10*3/uL (ref 150–400)
RBC: 4.32 MIL/uL (ref 3.87–5.11)
RDW: 16.6 % — ABNORMAL HIGH (ref 11.5–15.5)
WBC: 8 10*3/uL (ref 4.0–10.5)
nRBC: 0 % (ref 0.0–0.2)

## 2019-08-14 LAB — BASIC METABOLIC PANEL
Anion gap: 8 (ref 5–15)
BUN: <5 mg/dL — ABNORMAL LOW (ref 6–20)
CO2: 18 mmol/L — ABNORMAL LOW (ref 22–32)
Calcium: 7.6 mg/dL — ABNORMAL LOW (ref 8.9–10.3)
Chloride: 107 mmol/L (ref 98–111)
Creatinine, Ser: 0.76 mg/dL (ref 0.44–1.00)
GFR calc Af Amer: >60 mL/min (ref >60)
GFR calc non Af Amer: >60 mL/min (ref >60)
Glucose, Bld: 124 mg/dL — ABNORMAL HIGH (ref 70–99)
Potassium: 3.9 mmol/L (ref 3.5–5.1)
Sodium: 133 mmol/L — ABNORMAL LOW (ref 135–145)

## 2019-08-14 LAB — HIV ANTIBODY (ROUTINE TESTING W REFLEX): HIV Screen 4th Generation wRfx: NONREACTIVE

## 2019-08-14 MED ORDER — HYDROCODONE-ACETAMINOPHEN 5-325 MG PO TABS
1.0000 | ORAL_TABLET | Freq: Four times a day (QID) | ORAL | Status: DC | PRN
  Administered 2019-08-14 – 2019-08-18 (×10): 1 via ORAL
  Filled 2019-08-14 (×11): qty 1

## 2019-08-14 MED ORDER — SIMETHICONE 80 MG PO CHEW
80.0000 mg | CHEWABLE_TABLET | Freq: Four times a day (QID) | ORAL | Status: DC
  Administered 2019-08-14 – 2019-08-18 (×14): 80 mg via ORAL
  Filled 2019-08-14 (×16): qty 1

## 2019-08-14 MED ORDER — KETOROLAC TROMETHAMINE 30 MG/ML IJ SOLN
30.0000 mg | Freq: Once | INTRAMUSCULAR | Status: AC
  Administered 2019-08-14: 30 mg via INTRAVENOUS
  Filled 2019-08-14: qty 1

## 2019-08-14 MED ORDER — MORPHINE SULFATE (PF) 2 MG/ML IV SOLN
2.0000 mg | INTRAVENOUS | Status: DC | PRN
  Administered 2019-08-15 – 2019-08-17 (×4): 2 mg via INTRAVENOUS
  Filled 2019-08-14 (×3): qty 1
  Filled 2019-08-14: qty 2
  Filled 2019-08-14: qty 1

## 2019-08-14 MED ORDER — SODIUM CHLORIDE 0.9 % IV BOLUS
500.0000 mL | Freq: Once | INTRAVENOUS | Status: AC
  Administered 2019-08-14: 500 mL via INTRAVENOUS

## 2019-08-14 MED ORDER — PROMETHAZINE HCL 25 MG/ML IJ SOLN
12.5000 mg | Freq: Four times a day (QID) | INTRAMUSCULAR | Status: DC | PRN
  Administered 2019-08-14 – 2019-08-18 (×9): 12.5 mg via INTRAVENOUS
  Filled 2019-08-14 (×9): qty 1

## 2019-08-14 MED ORDER — SODIUM CHLORIDE 0.9 % IV BOLUS
1000.0000 mL | Freq: Once | INTRAVENOUS | Status: AC
  Administered 2019-08-14: 1000 mL via INTRAVENOUS

## 2019-08-14 NOTE — Progress Notes (Signed)
PROGRESS NOTE    Nancy Reeves  JXB:[id number]  DOB: 03/06/1981  DOA: 08/12/2019 PCP: Patient, No Pcp Per  Brief Narrative:  38 y.o. female with medical history significant of GERD, pseudotumor cerebri, morbid obesity, fibromyalgia who presented to the ER with abdominal pain, diarrhea for about 4 weeks.  Patient received Augmentin on August 22 for dental infection. She reports about 8 episodes of diarrhea/day with abdominal pain at 7/10. Work-up done showed colitis on CT scan, extending from the proximal descending colon through the distal sigmoid colon with ?possible appendicitis but no perforation or abscess.  Dr. Maisie Fus of surgery was contacted and patient transferred from [facility] ED. She got ceftriaxone metronidazole and saline in the ER.   ED Course: Temperature is 102.9 blood pressure 145/86 pulse 146 respirate of 20 oxygen sats 95% on room air.  White count 11.3 hemoglobin 10.9 and platelet 397. Sodium 129.  Calcium 8.0 with albumin 2.6 lipase of 17.  Urinalysis essentially negative.  Stool test is positive for C. difficile.  COVID-19 is negative. CT abdomen pelvis showed colitis ?pssobile appendicitis. Patient has been evaluated by GS who feel appendicitis is unlikely and recommended treatment for C.diff colitis.Patient admitted to hospitalist service with po vancomycin.   Subjective: Continues to have abdominal pain which is uncontrolled.  Also has nausea but no vomiting.  Zofran is not working.  Abdomen is nondistended.  Objective: Vitals:   08/14/19 0910 08/14/19 1005 08/14/19 1108 08/14/19 1742  BP: 121/71 118/72 116/72 138/84  Pulse: (!) 115 (!) 117 (!) 122 (!) 124  Resp: 20 20 (!) 22   Temp: 99.8 F (37.7 C) 100 F (37.8 C) (!) 101 F (38.3 C) (!) 100.8 F (38.2 C)  TempSrc: Oral Oral Oral Oral  SpO2: 97% 97% 100% 97%  Weight:      Height:        Intake/Output Summary (Last 24 hours) at 08/14/2019 1955 Last data filed at 08/14/2019 1733 Gross per 24 hour  Intake 4638.66  ml  Output 875 ml  Net 3763.66 ml   Filed Weights   08/12/19 1127  Weight: 102.1 kg    Physical Examination:  General exam: Appears in moderate distress. Respiratory system: Clear to auscultation. Respiratory effort normal. Cardiovascular system: S1 & S2 heard, RRR. No JVD, murmurs, rubs, gallops or clicks. No pedal edema. Gastrointestinal system: Abdomen is nondistended, soft, moderate diffuse tenderness. No rebound or guarding. Normal bowel sounds heard. Central nervous system mentation: Alert and oriented. No focal neurological deficits. Extremities: Symmetric 5 x 5 power. Skin: No rashes, lesions or ulcers Psychiatry: Judgement and insight appear normal. Mood & affect appropriate.     Data Reviewed: I have personally reviewed following labs and imaging studies  CBC: Recent Labs  Lab 08/12/19 1229 08/13/19 0337 08/13/19 2040 08/14/19 0808  WBC 11.3* 7.5  --  8.0  NEUTROABS 8.2*  --   --   --   HGB 10.9* 9.5* 10.3* 9.4*  HCT 36.0 31.6* 34.3* 30.4*  MCV 70.7* 71.8*  --  70.4*  PLT 397 336  --  350   Basic Metabolic Panel: Recent Labs  Lab 08/12/19 1302 08/13/19 0337 08/14/19 0808  NA 129* 133* 133*  K 3.8 3.7 3.9  CL 98 107 107  CO2 19* 18* 18*  GLUCOSE 85 111* 124*  BUN 10 6 <5*  CREATININE 0.77 0.63 0.76  CALCIUM 8.0* 7.6* 7.6*   GFR: Estimated Creatinine Clearance: 106.7 mL/min (by C-G formula based on SCr of 0.76 mg/dL). Liver  Function Tests: Recent Labs  Lab 08/12/19 1302 08/13/19 0337  AST 17 14*  ALT 13 12  ALKPHOS 51 46  BILITOT 0.5 0.5  PROT 7.3 6.9  ALBUMIN 2.6* 2.5*   Recent Labs  Lab 08/12/19 1302  LIPASE 17   No results for input(s): AMMONIA in the last 168 hours. Coagulation Profile: No results for input(s): INR, PROTIME in the last 168 hours. Cardiac Enzymes: No results for input(s): CKTOTAL, CKMB, CKMBINDEX, TROPONINI in the last 168 hours. BNP (last 3 results) No results for input(s): PROBNP in the last 8760 hours.  HbA1C: No results for input(s): HGBA1C in the last 72 hours. CBG: No results for input(s): GLUCAP in the last 168 hours. Lipid Profile: No results for input(s): CHOL, HDL, LDLCALC, TRIG, CHOLHDL, LDLDIRECT in the last 72 hours. Thyroid Function Tests: No results for input(s): TSH, T4TOTAL, FREET4, T3FREE, THYROIDAB in the last 72 hours. Anemia Panel: No results for input(s): VITAMINB12, FOLATE, FERRITIN, TIBC, IRON, RETICCTPCT in the last 72 hours. Sepsis Labs: No results for input(s): PROCALCITON, LATICACIDVEN in the last 168 hours.  Recent Results (from the past 240 hour(s))  C Difficile Quick Screen w PCR reflex     Status: Abnormal   Collection Time: 08/12/19  1:43 PM   Specimen: Urine, Clean Catch; Stool  Result Value Ref Range Status   C Diff antigen POSITIVE (A) NEGATIVE Final   C Diff toxin NEGATIVE NEGATIVE Final   C Diff interpretation Results are indeterminate. See PCR results.  Final    Comment: Performed at [facility], [address] [place], [place] [address]  C. Diff by PCR, Reflexed     Status: Abnormal   Collection Time: 08/12/19  1:43 PM  Result Value Ref Range Status   Toxigenic C. Difficile by PCR POSITIVE (A) NEGATIVE Final    Comment: Positive for toxigenic C. difficile with little to no toxin production. Only treat if clinical presentation suggests symptomatic illness. Performed at [facility], [address] [place], [place] [address]   SARS Coronavirus 2 [facility](Hospital order, Performed in [facility] hospital lab) Nasopharyngeal Nasopharyngeal Swab     Status: None   Collection Time: 08/12/19  2:19 PM   Specimen: Nasopharyngeal Swab  Result Value Ref Range Status   SARS Coronavirus 2 NEGATIVE NEGATIVE Final    Comment: (NOTE) If result is NEGATIVE SARS-CoV-2 target nucleic acids are NOT DETECTED. The SARS-CoV-2 RNA is generally detectable in upper and lower  respiratory specimens during the acute phase of infection. The lowest   concentration of SARS-CoV-2 viral copies this assay can detect is 250  copies / mL. A negative result does not preclude SARS-CoV-2 infection  and should not be used as the sole basis for treatment or other  patient management decisions.  A negative result may occur with  improper specimen collection / handling, submission of specimen other  than nasopharyngeal swab, presence of viral mutation(s) within the  areas targeted by this assay, and inadequate number of viral copies  (<250 copies / mL). A negative result must be combined with clinical  observations, patient history, and epidemiological information. If result is POSITIVE SARS-CoV-2 target nucleic acids are DETECTED. The SARS-CoV-2 RNA is generally detectable in upper and lower  respiratory specimens dur ing the acute phase of infection.  Positive  results are indicative of active infection with SARS-CoV-2.  Clinical  correlation with patient history and other diagnostic information is  necessary to determine patient infection status.  Positive results  do  not rule out bacterial infection or co-infection with other viruses. If result is PRESUMPTIVE POSTIVE SARS-CoV-2 nucleic acids MAY BE PRESENT.   A presumptive positive result was obtained on the submitted specimen  and confirmed on repeat testing.  While 2019 novel coronavirus  (SARS-CoV-2) nucleic acids may be present in the submitted sample  additional confirmatory testing may be necessary for epidemiological  and / or clinical management purposes  to differentiate between  SARS-CoV-2 and other Sarbecovirus currently known to infect humans.  If clinically indicated additional testing with an alternate test  methodology [phone]) is advised. The SARS-CoV-2 RNA is generally  detectable in upper and lower respiratory sp ecimens during the acute  phase of infection. The expected result is Negative. Fact Sheet for Patients:  BoilerBrush.com.cy Fact Sheet  for Healthcare Providers: https://pope.com/ This test is not yet approved or cleared by the [place] FDA and has been authorized for detection and/or diagnosis of SARS-CoV-2 by FDA under an Emergency Use Authorization (EUA).  This EUA will remain in effect (meaning this test can be used) for the duration of the COVID-19 declaration under Section 564(b)(1) of the Act, 21 U.S.C. section 360bbb-3(b)(1), unless the authorization is terminated or revoked sooner. Performed at [facility], [address] [place], [place] [address]   C difficile quick scan w PCR reflex     Status: None   Collection Time: 08/12/19  7:17 PM   Specimen: STOOL  Result Value Ref Range Status   C Diff antigen NEGATIVE NEGATIVE Final   C Diff toxin NEGATIVE NEGATIVE Final   C Diff interpretation No C. difficile detected.  Final    Comment: Performed at [facility], [address], [place], [place] [address]      Radiology Studies: Dg Abd Portable 1v  Result Date: 08/14/2019 CLINICAL DATA:  Abdominal pain. EXAM: PORTABLE ABDOMEN - 1 VIEW COMPARISON:  CT scan August 12, 2019 FINDINGS: Suggested haustral thickening in the transverse colon. No evidence of bowel obstruction. Fecal loading in the ascending colon. There is a paucity of gas in the descending colon and rectum. No free air, portal venous gas, or pneumatosis. No renal stones identified. No old are normal. IMPRESSION: 1. Suggested haustral thickening in the transverse colon. Given colitis seen in the descending and sigmoid colon on a CT scan from 2 days ago, the findings in the transverse colon are suspicious for colitis. CT imaging could better evaluate. Electronically Signed   By: Gerome Sam III M.D   On: 08/14/2019 13:56        Scheduled Meds: . acidophilus  2 capsule Oral TID  . simethicone  80 mg Oral QID  . vancomycin  125 mg Oral QID   Continuous Infusions: . sodium chloride  Stopped (08/12/19 1614)  . dextrose 5 % and 0.9 % NaCl with KCl 20 mEq/L 125 mL/hr at 08/14/19 1931    Assessment & Plan:    1.  C. difficile colitis: Continue oral vancomycin.  DC IV Cipro and Flagyl.   advance diet as tolerated. Probiotics and simethicone ordered given complaints of bloating/flatulence. X-ray shows evidence of bowel wall thickening without any toxic megacolon.  Will monitor.  2.  Rectal bleeding: In the setting of problem #1 and history of anal fissure/hemorrhoids.  Hemoglobin did drop to 9.5.  Monitor closely and transfuse as needed.  Consider GI evaluation if worsens.  3.  Abnormal CT scan: Raising suspicion for appendicitis.  Seen by general surgery and  cleared with recommendations of no surgical intervention.  4.  GERD: Avoid PPI with problem #1  5.  History of migraine/pseudotumor cerebri: [facility] neurology.  No complaints of headache currently  6.  Hyponatremia: Likely hypovolemic hyponatremia in the setting of problem #1.  Improving with IV fluids.  DVT prophylaxis: Avoid anticoagulants with problem #2.  She has been ambulating to the bathroom Code Status: Full code Family / Patient Communication: Discussed with patient and all questions answered Disposition Plan: Home when medically cleared     LOS: 2 days    Time spent:     Berle Mull, MD [facility]  If 7PM-7AM, please contact night-coverage [email or web address] Password [facility] 08/14/2019, 7:55 PM

## 2019-08-14 NOTE — Progress Notes (Addendum)
   08/13/19 1356  MEWS Score  Resp 18  Pulse Rate (!) 116  BP (!) 134/92  Temp (!) 100.7 F (38.2 C)  SpO2 97 %  O2 Device Room Air  MEWS Score  MEWS RR 0  MEWS Pulse 2  MEWS Systolic 0  MEWS LOC 0  MEWS Temp 1  MEWS Score 3  MEWS Score Color Yellow   Pt had MEWs of yellow from earlier in the day due to temp of 100.7 and pulse of 116, no PRNs given.  Patient's vitals retaken and patient currently has temp of 102.9 and pulse of 140. On call MD /NP notified of patient's MEWS score and vitals. Order given for 1000 ml bolus of Nacl. Pt is being monitored and will continue to be monitored throughout shift. Dawson Bills, RN

## 2019-08-14 NOTE — Plan of Care (Signed)
Patient ambulating back to bed from bathroom this morning; pain minimal at this time, no nausea. No needs expressed. Will continue to monitor.

## 2019-08-14 NOTE — Progress Notes (Signed)
   08/14/19 0455  Vitals  Temp (!) 102.8 F (39.3 C)  Temp Source Oral  BP (!) 149/87  MAP (mmHg) 106  BP Location Right Arm  BP Method Automatic  Patient Position (if appropriate) Lying  Pulse Rate (!) 132  Pulse Rate Source Monitor  Resp 20  Oxygen Therapy  SpO2 98 %  O2 Device Room Air   Pt currently has MEWs of RED as previously, will administer tylenol and contact MD on call. Advised that I do not need to call rapid due to pulse elevation due to temp. Per AC on shift. Pt is stable and being monitored and will restart protocol. Dawson Bills, RN

## 2019-08-14 NOTE — Progress Notes (Signed)
MEWS Red protocol initiated during previous shift due to elevated temperature and heart rate; patient stable with no complaints at this time. Most recent vital signs show MEWS at Yellow level. Will continue with MEWS Red protocol to completion. Will continue to monitor.

## 2019-08-15 ENCOUNTER — Inpatient Hospital Stay (HOSPITAL_COMMUNITY): Payer: BC Managed Care – PPO

## 2019-08-15 ENCOUNTER — Encounter (HOSPITAL_COMMUNITY): Payer: Self-pay

## 2019-08-15 LAB — URINALYSIS, COMPLETE (UACMP) WITH MICROSCOPIC
Bilirubin Urine: NEGATIVE
Glucose, UA: NEGATIVE mg/dL
Ketones, ur: NEGATIVE mg/dL
Leukocytes,Ua: NEGATIVE
Nitrite: NEGATIVE
Protein, ur: NEGATIVE mg/dL
Specific Gravity, Urine: 1.018 (ref 1.005–1.030)
pH: 5 (ref 5.0–8.0)

## 2019-08-15 LAB — CBC WITH DIFFERENTIAL/PLATELET
Abs Immature Granulocytes: 0.1 10*3/uL — ABNORMAL HIGH (ref 0.00–0.07)
Basophils Absolute: 0.1 10*3/uL (ref 0.0–0.1)
Basophils Relative: 1 %
Eosinophils Absolute: 0.2 10*3/uL (ref 0.0–0.5)
Eosinophils Relative: 2 %
HCT: 30.5 % — ABNORMAL LOW (ref 36.0–46.0)
Hemoglobin: 9.4 g/dL — ABNORMAL LOW (ref 12.0–15.0)
Immature Granulocytes: 1 %
Lymphocytes Relative: 13 %
Lymphs Abs: 1.2 10*3/uL (ref 0.7–4.0)
MCH: 22 pg — ABNORMAL LOW (ref 26.0–34.0)
MCHC: 30.8 g/dL (ref 30.0–36.0)
MCV: 71.3 fL — ABNORMAL LOW (ref 80.0–100.0)
Monocytes Absolute: 1.6 10*3/uL — ABNORMAL HIGH (ref 0.1–1.0)
Monocytes Relative: 17 %
Neutro Abs: 6.1 10*3/uL (ref 1.7–7.7)
Neutrophils Relative %: 66 %
Platelets: 354 10*3/uL (ref 150–400)
RBC: 4.28 MIL/uL (ref 3.87–5.11)
RDW: 17.1 % — ABNORMAL HIGH (ref 11.5–15.5)
WBC: 9.2 10*3/uL (ref 4.0–10.5)
nRBC: 0 % (ref 0.0–0.2)

## 2019-08-15 LAB — COMPREHENSIVE METABOLIC PANEL
ALT: 12 U/L (ref 0–44)
AST: 13 U/L — ABNORMAL LOW (ref 15–41)
Albumin: 2.3 g/dL — ABNORMAL LOW (ref 3.5–5.0)
Alkaline Phosphatase: 45 U/L (ref 38–126)
Anion gap: 6 (ref 5–15)
BUN: <5 mg/dL — ABNORMAL LOW (ref 6–20)
CO2: 21 mmol/L — ABNORMAL LOW (ref 22–32)
Calcium: 7.8 mg/dL — ABNORMAL LOW (ref 8.9–10.3)
Chloride: 108 mmol/L (ref 98–111)
Creatinine, Ser: 0.68 mg/dL (ref 0.44–1.00)
GFR calc Af Amer: >60 mL/min (ref >60)
GFR calc non Af Amer: >60 mL/min (ref >60)
Glucose, Bld: 141 mg/dL — ABNORMAL HIGH (ref 70–99)
Potassium: 3.8 mmol/L (ref 3.5–5.1)
Sodium: 135 mmol/L (ref 135–145)
Total Bilirubin: 0.5 mg/dL (ref 0.3–1.2)
Total Protein: 6.7 g/dL (ref 6.5–8.1)

## 2019-08-15 LAB — PROTIME-INR
INR: 1.4 — ABNORMAL HIGH (ref 0.8–1.2)
Prothrombin Time: 16.6 seconds — ABNORMAL HIGH (ref 11.4–15.2)

## 2019-08-15 LAB — LACTIC ACID, PLASMA: Lactic Acid, Venous: 1.1 mmol/L (ref 0.5–1.9)

## 2019-08-15 LAB — IRON AND TIBC
Iron: 9 ug/dL — ABNORMAL LOW (ref 28–170)
Saturation Ratios: 6 % — ABNORMAL LOW (ref 10.4–31.8)
TIBC: 157 ug/dL — ABNORMAL LOW (ref 250–450)
UIBC: 148 ug/dL

## 2019-08-15 LAB — MAGNESIUM: Magnesium: 1.6 mg/dL — ABNORMAL LOW (ref 1.7–2.4)

## 2019-08-15 LAB — FERRITIN: Ferritin: 114 ng/mL (ref 11–307)

## 2019-08-15 LAB — VITAMIN B12: Vitamin B-12: 1674 pg/mL — ABNORMAL HIGH (ref 180–914)

## 2019-08-15 LAB — PROCALCITONIN: Procalcitonin: 2.04 ng/mL

## 2019-08-15 MED ORDER — METOCLOPRAMIDE HCL 5 MG/ML IJ SOLN
5.0000 mg | Freq: Four times a day (QID) | INTRAMUSCULAR | Status: DC
  Administered 2019-08-15 – 2019-08-16 (×4): 5 mg via INTRAVENOUS
  Filled 2019-08-15 (×4): qty 2

## 2019-08-15 MED ORDER — MAGNESIUM SULFATE 2 GM/50ML IV SOLN
2.0000 g | Freq: Once | INTRAVENOUS | Status: AC
  Administered 2019-08-15: 2 g via INTRAVENOUS
  Filled 2019-08-15: qty 50

## 2019-08-15 MED ORDER — IOHEXOL 300 MG/ML  SOLN
30.0000 mL | Freq: Once | INTRAMUSCULAR | Status: AC | PRN
  Administered 2019-08-15: 30 mL via ORAL

## 2019-08-15 MED ORDER — IOHEXOL 300 MG/ML  SOLN
100.0000 mL | Freq: Once | INTRAMUSCULAR | Status: AC | PRN
  Administered 2019-08-15: 100 mL via INTRAVENOUS

## 2019-08-15 MED ORDER — SODIUM CHLORIDE (PF) 0.9 % IJ SOLN
INTRAMUSCULAR | Status: AC
  Filled 2019-08-15: qty 50

## 2019-08-15 MED ORDER — SODIUM CHLORIDE 0.9 % IV BOLUS
500.0000 mL | Freq: Once | INTRAVENOUS | Status: AC
  Administered 2019-08-15: 500 mL via INTRAVENOUS

## 2019-08-15 MED ORDER — METRONIDAZOLE IN NACL 5-0.79 MG/ML-% IV SOLN
500.0000 mg | Freq: Three times a day (TID) | INTRAVENOUS | Status: DC
  Administered 2019-08-15 – 2019-08-18 (×9): 500 mg via INTRAVENOUS
  Filled 2019-08-15 (×9): qty 100

## 2019-08-15 NOTE — Progress Notes (Addendum)
Red MEWS protocol initiated; HR 122, T 102.6. Dr. Posey Pronto paged via [place] with status update. New orders received; see orders tab. Tylenol PO given. Will continue to monitor.   MD ordered portable CXR, STAT CT with contrast, IV Reglan. Request transfer to telemetry floor for continued monitoring.

## 2019-08-15 NOTE — Progress Notes (Signed)
On call provider notified of MEWs yellow from Red, patient is calm and stable at the moment. Patient is not able to finish all of contract needed for CT, on call provider notified, new orders obtained (500cc bolus and it is okay to send patient to CT)  Norlene Duel RN, BSN

## 2019-08-15 NOTE — Progress Notes (Signed)
MEWS Red 5; Sepsis score 5; Dr. Posey Pronto notified and rapid response assessment requested. Repeat VS improved; Dr. Posey Pronto to room to evaluate; move forward with tele transfer and CT abdomen once contrast completed. Will continue to monitor.

## 2019-08-15 NOTE — Plan of Care (Signed)
Patient lying in bed this morning; complains of pain and nausea. No needs expressed at this time. MEWS Green at this time. Will continue to monitor.

## 2019-08-15 NOTE — Progress Notes (Signed)
PROGRESS NOTE    Ellery Tash  CNO:[id number]  DOB: September 23, 1981  DOA: 08/12/2019 PCP: Patient, No Pcp Per  Brief Narrative:  38 y.o. female with medical history significant of GERD, pseudotumor cerebri, morbid obesity, fibromyalgia who presented to the ER with abdominal pain, diarrhea for about 4 weeks.  Patient received Augmentin on August 22 for dental infection. She reports about 8 episodes of diarrhea/day with abdominal pain at 7/10. Work-up done showed colitis on CT scan, extending from the proximal descending colon through the distal sigmoid colon with ?possible appendicitis but no perforation or abscess.  Dr. Marcello Moores of surgery was contacted and patient transferred from [facility] ED. She got ceftriaxone metronidazole and saline in the ER.   ED Course: Temperature is 102.9 blood pressure 145/86 pulse 146 respirate of 20 oxygen sats 95% on room air.  White count 11.3 hemoglobin 10.9 and platelet 397. Sodium 129.  Calcium 8.0 with albumin 2.6 lipase of 17.  Urinalysis essentially negative.  Stool test is positive for C. difficile.  COVID-19 is negative. CT abdomen pelvis showed colitis ?pssobile appendicitis. Patient has been evaluated by GS who feel appendicitis is unlikely and recommended treatment for C.diff colitis.Patient admitted to hospitalist service with po vancomycin.   Subjective: Reports severe abdominal pain unchanged from yesterday.  Also reports occasional nausea.  No vomiting.  Multiple bowel movements without any blood.  Continues to have fever.  No cough no shortness of breath.  No chest pain.  Assessment & Plan: 1.  Sepsis secondary to C. difficile colitis. Presents with complaints of abdominal pain. CT abdomen shows evidence of colitis as well as possible appendicitis. Patient recently was on antibiotic and started to have diarrhea first followed by abdominal pain. C. difficile PCR positive for toxigenic strain. General surgery was consulted who has ruled out possibility of  appendicitis and signed off for now. Patient was initially treated with oral vancomycin as well as Cipro and Flagyl which is currently discontinued. We will continue with oral vancomycin but add IV Flagyl given patient's persistent fever. X-ray abdomen on 08/14/2019 was not showing any evidence of significant toxic megacolon. Due to persistent fever we will get chest x-ray repeat blood cultures repeat urine analysis as well as a repeat CT scan of the abdomen with contrast to identify anything hidden pathology. Adding IV Flagyl. Low threshold to broaden the antibiotic coverage. If condition does not improve we will consider consulting ID on 08/16/2019. Currently will transfer to telemetry for close observation.  2.  Rectal bleeding: In the setting of problem #1 and history of anal fissure/hemorrhoids.  Hemoglobin did drop to 9.5.  Monitor closely and transfuse as needed.  Consider GI evaluation if worsens.  3.  Abnormal CT scan: Raising suspicion for appendicitis.  Seen by general surgery and cleared with recommendations of no surgical intervention.  4.  GERD: Avoid PPI with problem #1  5.  History of migraine/pseudotumor cerebri: [facility] neurology.  No complaints of headache currently  6.  Hyponatremia: Likely hypovolemic hyponatremia in the setting of problem #1.  Improving with IV fluids.  7.  Sinus tachycardia. In response to severe infection. Monitor.  8.  Microcytic anemia. Iron deficiency anemia. Likely secondary to menstrual blood loss. Patient did have some rectal bleeding associated with her hemorrhoids as well that can be also the cause of chronic blood loss. Currently no active bleeding. H&H relatively stable. Once sepsis resolves patient will require IV Feraheme.  9.  Hypomagnesemia. Replacing IV.  10.  Poor p.o. intake.  Patient has been in the hospital for 3 days and has poor p.o. intake. Concerning for nutritional status. We will monitor progression in the  next 48 hours.  DVT prophylaxis: Avoid anticoagulants with problem #2.  She has been ambulating to the bathroom Code Status: Full code Family / Patient Communication: Discussed with patient and all questions answered Disposition Plan: Home when medically cleared  Objective: Vitals:   08/15/19 1505 08/15/19 1531 08/15/19 1553 08/15/19 1606  BP: (!) 135/93 130/89 134/88 137/89  Pulse: (!) 135 (!) 122 (!) 121 (!) 125  Resp: 20 20 20 20   Temp: (!) 101.8 F (38.8 C) 100 F (37.8 C) (!) 102.2 F (39 C) (!) 100.9 F (38.3 C)  TempSrc: Oral Oral Oral Oral  SpO2: 100% 100% 95% 96%  Weight:      Height:        Intake/Output Summary (Last 24 hours) at 08/15/2019 1614 Last data filed at 08/15/2019 1424 Gross per 24 hour  Intake 2159.14 ml  Output 700 ml  Net 1459.14 ml   Filed Weights   08/12/19 1127  Weight: 102.1 kg    Physical Examination:  General: alert and oriented to time, place, and person. Appear in marked distress, affect appropriate Eyes: PERRL, Conjunctiva normal ENT: Oral Mucosa Clear, dry  Neck: no JVD, no Abnormal Mass Or lumps Cardiovascular: S1 and S2 Present, no Murmur, peripheral pulses symmetrical Respiratory: good respiratory effort, Bilateral Air entry equal and Decreased, no signs of accessory muscle use, Clear to Auscultation, no Crackles, no wheezes Abdomen: Bowel Sound present, Soft and mild dffuse tenderness, no hernia Skin: no rashes  Extremities: no Pedal edema, no calf tenderness Neurologic: without any new focal findings Gait not checked due to patient safety concerns     Data Reviewed: I have personally reviewed following labs and imaging studies  CBC: Recent Labs  Lab 08/12/19 1229 08/13/19 0337 08/13/19 2040 08/14/19 0808 08/15/19 0645  WBC 11.3* 7.5  --  8.0 9.2  NEUTROABS 8.2*  --   --   --  6.1  HGB 10.9* 9.5* 10.3* 9.4* 9.4*  HCT 36.0 31.6* 34.3* 30.4* 30.5*  MCV 70.7* 71.8*  --  70.4* 71.3*  PLT 397 336  --  350 354   Basic  Metabolic Panel: Recent Labs  Lab 08/12/19 1302 08/13/19 0337 08/14/19 0808 08/15/19 0645  NA 129* 133* 133* 135  K 3.8 3.7 3.9 3.8  CL 98 107 107 108  CO2 19* 18* 18* 21*  GLUCOSE 85 111* 124* 141*  BUN 10 6 <5* <5*  CREATININE 0.77 0.63 0.76 0.68  CALCIUM 8.0* 7.6* 7.6* 7.8*  MG  --   --   --  1.6*   GFR: Estimated Creatinine Clearance: 106.7 mL/min (by C-G formula based on SCr of 0.68 mg/dL). Liver Function Tests: Recent Labs  Lab 08/12/19 1302 08/13/19 0337 08/15/19 0645  AST 17 14* 13*  ALT 13 12 12   ALKPHOS 51 46 45  BILITOT 0.5 0.5 0.5  PROT 7.3 6.9 6.7  ALBUMIN 2.6* 2.5* 2.3*   Recent Labs  Lab 08/12/19 1302  LIPASE 17   No results for input(s): AMMONIA in the last 168 hours. Coagulation Profile: Recent Labs  Lab 08/15/19 0645  INR 1.4*   Cardiac Enzymes: No results for input(s): CKTOTAL, CKMB, CKMBINDEX, TROPONINI in the last 168 hours. BNP (last 3 results) No results for input(s): PROBNP in the last 8760 hours. HbA1C: No results for input(s): HGBA1C in the last 72 hours. CBG: No  results for input(s): GLUCAP in the last 168 hours. Lipid Profile: No results for input(s): CHOL, HDL, LDLCALC, TRIG, CHOLHDL, LDLDIRECT in the last 72 hours. Thyroid Function Tests: No results for input(s): TSH, T4TOTAL, FREET4, T3FREE, THYROIDAB in the last 72 hours. Anemia Panel: Recent Labs    08/15/19 0645  VITAMINB12 1,674*  FERRITIN 114  TIBC 157*  IRON 9*   Sepsis Labs: Recent Labs  Lab 08/15/19 0645  LATICACIDVEN 1.1    Recent Results (from the past 240 hour(s))  C Difficile Quick Screen w PCR reflex     Status: Abnormal   Collection Time: 08/12/19  1:43 PM   Specimen: Urine, Clean Catch; Stool  Result Value Ref Range Status   C Diff antigen POSITIVE (A) NEGATIVE Final   C Diff toxin NEGATIVE NEGATIVE Final   C Diff interpretation Results are indeterminate. See PCR results.  Final    Comment: Performed at [facility], [address] [place], [place] [address]  C. Diff by PCR, Reflexed     Status: Abnormal   Collection Time: 08/12/19  1:43 PM  Result Value Ref Range Status   Toxigenic C. Difficile by PCR POSITIVE (A) NEGATIVE Final    Comment: Positive for toxigenic C. difficile with little to no toxin production. Only treat if clinical presentation suggests symptomatic illness. Performed at [facility], [address] [place], [place] [address]   SARS Coronavirus 2 [facility](Hospital order, Performed in [facility] hospital lab) Nasopharyngeal Nasopharyngeal Swab     Status: None   Collection Time: 08/12/19  2:19 PM   Specimen: Nasopharyngeal Swab  Result Value Ref Range Status   SARS Coronavirus 2 NEGATIVE NEGATIVE Final    Comment: (NOTE) If result is NEGATIVE SARS-CoV-2 target nucleic acids are NOT DETECTED. The SARS-CoV-2 RNA is generally detectable in upper and lower  respiratory specimens during the acute phase of infection. The lowest  concentration of SARS-CoV-2 viral copies this assay can detect is 250  copies / mL. A negative result does not preclude SARS-CoV-2 infection  and should not be used as the sole basis for treatment or other  patient management decisions.  A negative result may occur with  improper specimen collection / handling, submission of specimen other  than nasopharyngeal swab, presence of viral mutation(s) within the  areas targeted by this assay, and inadequate number of viral copies  (<250 copies / mL). A negative result must be combined with clinical  observations, patient history, and epidemiological information. If result is POSITIVE SARS-CoV-2 target nucleic acids are DETECTED. The SARS-CoV-2 RNA is generally detectable in upper and lower  respiratory specimens dur ing the acute phase of infection.  Positive  results are indicative of active infection with SARS-CoV-2.  Clinical  correlation with patient history and other diagnostic information is  necessary to determine patient  infection status.  Positive results do  not rule out bacterial infection or co-infection with other viruses. If result is PRESUMPTIVE POSTIVE SARS-CoV-2 nucleic acids MAY BE PRESENT.   A presumptive positive result was obtained on the submitted specimen  and confirmed on repeat testing.  While 2019 novel coronavirus  (SARS-CoV-2) nucleic acids may be present in the submitted sample  additional confirmatory testing may be necessary for epidemiological  and / or clinical management purposes  to differentiate between  SARS-CoV-2 and other Sarbecovirus currently known to infect humans.  If clinically indicated additional testing with an alternate test  methodology [phone] is advised. The SARS-CoV-2 RNA is  generally  detectable in upper and lower respiratory sp ecimens during the acute  phase of infection. The expected result is Negative. Fact Sheet for Patients:  BoilerBrush.com.cy Fact Sheet for Healthcare Providers: https://pope.com/ This test is not yet approved or cleared by the [place] FDA and has been authorized for detection and/or diagnosis of SARS-CoV-2 by FDA under an Emergency Use Authorization (EUA).  This EUA will remain in effect (meaning this test can be used) for the duration of the COVID-19 declaration under Section 564(b)(1) of the Act, 21 U.S.C. section 360bbb-3(b)(1), unless the authorization is terminated or revoked sooner. Performed at [facility], [address] [place], [place] [address]   C difficile quick scan w PCR reflex     Status: None   Collection Time: 08/12/19  7:17 PM   Specimen: STOOL  Result Value Ref Range Status   C Diff antigen NEGATIVE NEGATIVE Final   C Diff toxin NEGATIVE NEGATIVE Final   C Diff interpretation No C. difficile detected.  Final    Comment: Performed at [facility], [address] [place], [place] [address]      Radiology Studies: Dg Abd  Portable 1v  Result Date: 08/14/2019 CLINICAL DATA:  Abdominal pain. EXAM: PORTABLE ABDOMEN - 1 VIEW COMPARISON:  CT scan August 12, 2019 FINDINGS: Suggested haustral thickening in the transverse colon. No evidence of bowel obstruction. Fecal loading in the ascending colon. There is a paucity of gas in the descending colon and rectum. No free air, portal venous gas, or pneumatosis. No renal stones identified. No old are normal. IMPRESSION: 1. Suggested haustral thickening in the transverse colon. Given colitis seen in the descending and sigmoid colon on a CT scan from 2 days ago, the findings in the transverse colon are suspicious for colitis. CT imaging could better evaluate. Electronically Signed   By: Gerome Sam III M.D   On: 08/14/2019 13:56        Scheduled Meds: . acidophilus  2 capsule Oral TID  . metoCLOPramide (REGLAN) injection  5 mg Intravenous Q6H  . simethicone  80 mg Oral QID  . vancomycin  125 mg Oral QID   Continuous Infusions: . sodium chloride Stopped (08/12/19 1614)  . dextrose 5 % and 0.9 % NaCl with KCl 20 mEq/L 125 mL/hr at 08/14/19 1931  . magnesium sulfate bolus IVPB    . metronidazole       LOS: 3 days    Time spent:     Lynden Oxford, MD [facility]  If 7PM-7AM, please contact night-coverage [email or web address] Password [facility] 08/15/2019, 4:14 PM

## 2019-08-15 NOTE — Progress Notes (Signed)
Patient returned from CT, cardiac monitoring continued.

## 2019-08-15 NOTE — Progress Notes (Signed)
Patient going down for CT.  Norlene Duel RN, BSN

## 2019-08-16 DIAGNOSIS — A419 Sepsis, unspecified organism: Secondary | ICD-10-CM

## 2019-08-16 DIAGNOSIS — A0472 Enterocolitis due to Clostridium difficile, not specified as recurrent: Secondary | ICD-10-CM

## 2019-08-16 LAB — COMPREHENSIVE METABOLIC PANEL
ALT: 16 U/L (ref 0–44)
AST: 23 U/L (ref 15–41)
Albumin: 2.1 g/dL — ABNORMAL LOW (ref 3.5–5.0)
Alkaline Phosphatase: 44 U/L (ref 38–126)
Anion gap: 10 (ref 5–15)
BUN: <5 mg/dL — ABNORMAL LOW (ref 6–20)
CO2: 18 mmol/L — ABNORMAL LOW (ref 22–32)
Calcium: 7.5 mg/dL — ABNORMAL LOW (ref 8.9–10.3)
Chloride: 104 mmol/L (ref 98–111)
Creatinine, Ser: 0.78 mg/dL (ref 0.44–1.00)
GFR calc Af Amer: >60 mL/min (ref >60)
GFR calc non Af Amer: >60 mL/min (ref >60)
Glucose, Bld: 119 mg/dL — ABNORMAL HIGH (ref 70–99)
Potassium: 3.7 mmol/L (ref 3.5–5.1)
Sodium: 132 mmol/L — ABNORMAL LOW (ref 135–145)
Total Bilirubin: 0.2 mg/dL — ABNORMAL LOW (ref 0.3–1.2)
Total Protein: 6.3 g/dL — ABNORMAL LOW (ref 6.5–8.1)

## 2019-08-16 LAB — CBC WITH DIFFERENTIAL/PLATELET
Abs Immature Granulocytes: 0.08 10*3/uL — ABNORMAL HIGH (ref 0.00–0.07)
Basophils Absolute: 0.1 10*3/uL (ref 0.0–0.1)
Basophils Relative: 1 %
Eosinophils Absolute: 0.2 10*3/uL (ref 0.0–0.5)
Eosinophils Relative: 2 %
HCT: 30.2 % — ABNORMAL LOW (ref 36.0–46.0)
Hemoglobin: 9.3 g/dL — ABNORMAL LOW (ref 12.0–15.0)
Immature Granulocytes: 1 %
Lymphocytes Relative: 15 %
Lymphs Abs: 1.4 10*3/uL (ref 0.7–4.0)
MCH: 21.5 pg — ABNORMAL LOW (ref 26.0–34.0)
MCHC: 30.8 g/dL (ref 30.0–36.0)
MCV: 69.9 fL — ABNORMAL LOW (ref 80.0–100.0)
Monocytes Absolute: 2 10*3/uL — ABNORMAL HIGH (ref 0.1–1.0)
Monocytes Relative: 21 %
Neutro Abs: 5.8 10*3/uL (ref 1.7–7.7)
Neutrophils Relative %: 60 %
Platelets: 335 10*3/uL (ref 150–400)
RBC: 4.32 MIL/uL (ref 3.87–5.11)
RDW: 16.7 % — ABNORMAL HIGH (ref 11.5–15.5)
WBC: 9.6 10*3/uL (ref 4.0–10.5)
nRBC: 0 % (ref 0.0–0.2)

## 2019-08-16 LAB — MAGNESIUM: Magnesium: 1.9 mg/dL (ref 1.7–2.4)

## 2019-08-16 LAB — PHOSPHORUS: Phosphorus: 1.7 mg/dL — ABNORMAL LOW (ref 2.5–4.6)

## 2019-08-16 LAB — LACTIC ACID, PLASMA: Lactic Acid, Venous: 2.3 mmol/L (ref 0.5–1.9)

## 2019-08-16 MED ORDER — SODIUM PHOSPHATES 45 MMOLE/15ML IV SOLN
20.0000 mmol | Freq: Once | INTRAVENOUS | Status: AC
  Administered 2019-08-16: 20 mmol via INTRAVENOUS
  Filled 2019-08-16: qty 6.67

## 2019-08-16 MED ORDER — HYOSCYAMINE SULFATE 0.125 MG SL SUBL
0.2500 mg | SUBLINGUAL_TABLET | Freq: Once | SUBLINGUAL | Status: AC
  Administered 2019-08-16: 0.25 mg via SUBLINGUAL
  Filled 2019-08-16 (×2): qty 2

## 2019-08-16 MED ORDER — SACCHAROMYCES BOULARDII 250 MG PO CAPS
250.0000 mg | ORAL_CAPSULE | Freq: Two times a day (BID) | ORAL | Status: DC
  Administered 2019-08-16 – 2019-08-18 (×4): 250 mg via ORAL
  Filled 2019-08-16 (×4): qty 1

## 2019-08-16 MED ORDER — DICYCLOMINE HCL 10 MG PO CAPS
10.0000 mg | ORAL_CAPSULE | Freq: Three times a day (TID) | ORAL | Status: DC
  Administered 2019-08-16 – 2019-08-18 (×5): 10 mg via ORAL
  Filled 2019-08-16 (×5): qty 1

## 2019-08-16 MED ORDER — MORPHINE SULFATE (PF) 2 MG/ML IV SOLN
2.0000 mg | Freq: Once | INTRAVENOUS | Status: AC
  Administered 2019-08-16: 2 mg via INTRAVENOUS
  Filled 2019-08-16: qty 1

## 2019-08-16 MED ORDER — SODIUM CHLORIDE 0.9 % IV BOLUS
1000.0000 mL | Freq: Once | INTRAVENOUS | Status: AC
  Administered 2019-08-16: 1000 mL via INTRAVENOUS

## 2019-08-16 MED ORDER — SODIUM CHLORIDE 0.9 % IV BOLUS
500.0000 mL | Freq: Once | INTRAVENOUS | Status: AC
  Administered 2019-08-16: 500 mL via INTRAVENOUS

## 2019-08-16 MED ORDER — ONDANSETRON HCL 4 MG/2ML IJ SOLN
4.0000 mg | Freq: Four times a day (QID) | INTRAMUSCULAR | Status: DC | PRN
  Administered 2019-08-16: 21:00:00 4 mg via INTRAVENOUS
  Filled 2019-08-16: qty 2

## 2019-08-16 MED ORDER — VANCOMYCIN 50 MG/ML ORAL SOLUTION
500.0000 mg | Freq: Four times a day (QID) | ORAL | Status: DC
  Administered 2019-08-16 – 2019-08-18 (×7): 500 mg via ORAL
  Filled 2019-08-16 (×11): qty 10

## 2019-08-16 MED ORDER — SODIUM CHLORIDE 0.9 % IV BOLUS
500.0000 mL | Freq: Once | INTRAVENOUS | Status: AC
  Administered 2019-08-17: 500 mL via INTRAVENOUS

## 2019-08-16 NOTE — Progress Notes (Signed)
This RN spoke with both 7p-7a CN and pt primary RN, Norlene Duel.  CN stated she messaged Dr. Alfredia Ferguson with the 0700 MEWS score of 7 and gave the primary RN callback number. Neither  7p-7a CN nor primary RN, Tiffany, spoke with Dr. Alfredia Ferguson. And Dr. Alfredia Ferguson stated he did not receive a message from RN regarding MEWS score but recognized the pt had a score of 7 and placed new orders.   Primary RN noticed that new orders were placed by MD.  Primary RN, Tiffany stated she reported off to the on-coming RN, Sarah regarding MEWS score and new orders. Stacey Drain

## 2019-08-16 NOTE — Progress Notes (Signed)
Patient states pain is 9/10 and c/o nausea.  PCP was notified

## 2019-08-16 NOTE — Progress Notes (Signed)
   Vital Signs MEWS/VS Documentation       08/16/2019 0955 08/16/2019 1056 08/16/2019 1156 08/16/2019 1613   MEWS Score:  3  3  2  4    MEWS Score Color:  Yellow  Yellow  Yellow  Red   Resp:  (!) 22  16  16   (!) 30   Pulse:  (!) 129  (!) 119  (!) 115  (!) 125   BP:  129/84  92/76  114/72  (!) 149/84   Temp:  99.1 F (37.3 C)  99.7 F (37.6 C)  100.3 F (37.9 C)  99.6 F (37.6 C)   O2 Device:  Room Air  Room Health visitor  Room Air   O2 Flow Rate (L/min):  --  0 L/min  0 L/min  --        Paged attending MD with BP, and RR change.   Lactic Acid also resulted, and lab called with critical value of 2.3. See critical value sticker in patient chart. Paged MD    [name] J 08/16/2019,4:26 PM

## 2019-08-16 NOTE — Progress Notes (Signed)
New lab orders obtained for CBC, CMP, Magnesium and Phosphorus.

## 2019-08-16 NOTE — Progress Notes (Signed)
PROGRESS NOTE    Nancy Reeves  ZOX:[id number] DOB: 1980/12/21 DOA: 08/12/2019 PCP: Patient, No Pcp Per  Brief Narrative: The patient is a morbidly obese 38 year old African-American female with a past medical history significant for but not limited to GERD, pseudotumor cerebri, fibromyalgia, morbid obesity, as well as other comorbidities who presented to the ED with a chief complaint of abdominal pain, as well as diarrhea for about 4 weeks.  She had received Augmentin on August 22 for dental infection and abscess and she reports 8 episodes of diarrhea a day with abdominal pain that was a 7 out of 10 in severity.  Further work-up showed that she had a colitis on CT scan that was extending from the proximal descending colon to the distal sigmoid colon with question of possible appendicitis but no perforation or abscess.  Dr. Maisie Fus of general surgery evaluated after she is contacted and felt that she did not have any appendicitis and recommended medical management.  In the ED she received ceftriaxone, metronidazole as well as normal saline.  Further work-up revealed that her stool test was positive for toxigenic C. difficile via PCR.  COVID-19 testing was negative.  She is admitted for C. difficile colitis and admitted to the hospital service and started on p.o. vancomycin.  Because she continued to be septic and had a lack of improvement a repeat CT scan was done which showed continued severe generalized colitis that relatively spared the cecum and ascending colon although secondary involvement of the appendix and terminal ileum was suspected but no evidence of bowel obstruction or perforation.  I discussed the case with ID Dr. Orvan Falconer who stated to increase her vancomycin to 500 mg 4 times daily given her lack of response and I have also contacted Gastroenterology for further evaluation recommendations.  Assessment & Plan:   Principal Problem:   C. Diff Colitis Active Problems:   Hyponatremia  Leucocytosis   Sepsis secondary to C. difficile Colitis. -Presents with complaints of abdominal pain and was febrile, tachycardic, tachypneic and has a source with colitis. -CT abdomen shows evidence of colitis as well as possible appendicitis. Patient recently was on antibiotic and started to have diarrhea first followed by abdominal pain. -C. difficile PCR positive for toxigenic strain. -General surgery was consulted who has ruled out possibility of appendicitis and signed off for now. -Patient was initially treated with oral vancomycin as well as Cipro and Flagyl which is currently discontinued. -We will continue with oral vancomycin but add IV Flagyl given patient's persistent fever and increased dose from 125 mg 4 times daily to 500 mg 4 times daily at the recommendation with Dr. Orvan Falconer -X-ray abdomen on 08/14/2019 was not showing any evidence of significant toxic megacolon. -Due to persistent fever we will get chest x-ray repeat blood cultures repeat urine analysis as well as a repeat CT scan of the abdomen with contrast to identify anything hidden pathology. -Repeat CT Abdomen and Pelvis showed "Continued severe generalized colitis. This relatively spares the cecum and ascending colon although secondary involvement of the appendix and terminal ileum is suspected. No bowel obstruction or perforation. A small volume of free fluid appears to be reactive. No abscess or drainable fluid collection at this time. Mild lung base atelectasis." -Discussed with Infectious Diseases Dr. Orvan Falconer who recommended increasing p.o. vancomycin and states that she may need vancomycin enemas -Continue to Monitor on Telemetry and low threshold to transfer to stepdown unit if not improving significantly  Rectal bleeding -In the setting of above and history  of anal fissure/hemorrhoids.  Hemoglobin did drop to 9.5 and is stable at 9.3 this AM.   -Continue to Monitor closely and transfuse as needed.   -Will have  GI weigh in for further Recc's  Abnormal CT scan -Raising suspicion for appendicitis.   -Seen by General Surgery and cleared with recommendations of no surgical intervention.  GERD -Avoid PPI given C. difficile colitis and will also avoid famotidine 40 mg p.o. daily  History of Migraine/Pseudotumor Cerebri -Follows with [facility] neurology.   -Complaining of a migraine this morning and I looked at her allergies and looks like that she is allergic to triptans -Continue to treat symptomatically and may need alternative medications and given acetaminophen  Hyponatremia -Likely hypovolemic hyponatremia in the setting of above problems -Sodium went from 135 and is now 132 -Given bolus of normal saline 1 L this morning -Continue D5 normal saline +20 mEq of KCl at 125 mL's per hour for now given her sepsis -Given IV sodium phosphate 20 mmol -Continue monitor and trend and repeat CMP in a.m.  Sinus tachycardia. -In response to severe infection. -Given another 1 L bolus this morning and will need to continue to monitor on telemetry  Microcytic anemia and Iron deficiency anemia. -Likely secondary to menstrual blood loss. -Patient did have some rectal bleeding associated with her hemorrhoids as well that can be also the cause of chronic blood loss. -Continue to monitor for signs and symptoms of bleeding; currently no active bleeding. -Patient's anemia panel showed an iron level of 9, U IBC of 148, TIBC 157, saturation ratios of 6%, ferritin level 114, vitamin B12 level 1674 -Patient's hemoglobin/hematocrit is relatively stable and is now 9.3/30.2 -Once sepsis resolves patient will require IV Feraheme. -Repeat CBC in a.m.  Hypomagnesemia. -Patient mag level is now 1.9 -Continue to monitor and replete as necessary -Repeat magnesium level in the AM  Poor p.o. intake. -Patient has been in the hospital for 3 days and has poor p.o. intake and currently is n.p.o. -We will advance diet  to clear liquid diet and have gastroenterology further evaluate for dilated -Concerning for nutritional status. -We will monitor progression in the next 48 hours and she may need enteral nutrition   Morbid Obesity -Estimated body mass index is 41.98 kg/m as calculated from the following:   Height as of this encounter: 5\' 2"  (1.575 m).   Weight as of this encounter: 104.1 kg. -Weight Loss and Dietary Counseling given   Hypophosphatemia Patient's phosphorus level this morning is 1.7 -Replete with IV sodium phosphate first 20 mmol -Continue monitor and replete as necessary -Repeat phosphorus level in a.m.  Metabolic Acidosis -Patient CO2 was 18, chloride was 104, and anion gap is 10 -In the setting of C. difficile colitis -Continue to monitor and continue IV fluid hydration and if necessary will switch to sodium bicarbonate -Repeat CMP in a.m.  DVT prophylaxis: No Code Status: FULL CODE  Family Communication: No family present at bedside  Disposition Plan: Remain Inpatient for continued workup and treatment  Consultants:   Discussed with ID Dr.  Gastroenterology   General Surgery  Procedures: None   Antimicrobials:  Anti-infectives (From admission, onward)   Start     Dose/Rate Route Frequency Ordered Stop   09/18/19 1000  vancomycin (VANCOCIN) 50 mg/mL oral solution 125 mg  Status:  Discontinued     125 mg Oral Every 3 DAYS 08/12/19 1920 08/12/19 1934   09/10/19 1000  vancomycin (VANCOCIN) 50 mg/mL oral solution 125 mg  Status:  Discontinued     125 mg Oral Every other day 08/12/19 1920 08/12/19 1934   09/03/19 1000  vancomycin (VANCOCIN) 50 mg/mL oral solution 125 mg  Status:  Discontinued     125 mg Oral Daily 08/12/19 1920 08/12/19 1934   08/26/19 2200  vancomycin (VANCOCIN) 50 mg/mL oral solution 125 mg  Status:  Discontinued     125 mg Oral 2 times daily 08/12/19 1920 08/12/19 1934   08/16/19 1400  vancomycin (VANCOCIN) 50 mg/mL oral solution 500 mg      500 mg Oral 4 times daily 08/16/19 1231 08/22/19 2159   08/15/19 1445  metroNIDAZOLE (FLAGYL) IVPB 500 mg     500 mg 100 mL/hr over 60 Minutes Intravenous Every 8 hours 08/15/19 1441     08/12/19 2200  metroNIDAZOLE (FLAGYL) IVPB 500 mg  Status:  Discontinued     500 mg 100 mL/hr over 60 Minutes Intravenous Every 8 hours 08/12/19 1918 08/13/19 1126   08/12/19 2200  vancomycin (VANCOCIN) 50 mg/mL oral solution 125 mg  Status:  Discontinued     125 mg Oral 4 times daily 08/12/19 1920 08/16/19 1231   08/12/19 2200  ciprofloxacin (CIPRO) IVPB 400 mg  Status:  Discontinued     400 mg 200 mL/hr over 60 Minutes Intravenous Every 12 hours 08/12/19 1926 08/13/19 1126   08/12/19 1930  vancomycin (VANCOCIN) 50 mg/mL oral solution 125 mg  Status:  Discontinued     125 mg Oral 4 times daily 08/12/19 1920 08/12/19 1934   08/12/19 1500  metroNIDAZOLE (FLAGYL) IVPB 500 mg     500 mg 100 mL/hr over 60 Minutes Intravenous  Once 08/12/19 1454 08/12/19 1607   08/12/19 1415  cefTRIAXone (ROCEPHIN) 2 g in sodium chloride 0.9 % 100 mL IVPB     2 g 200 mL/hr over 30 Minutes Intravenous  Once 08/12/19 1404 08/12/19 1448     Subjective: Seen and examined at bedside she is sitting at the edge of the bed and was frustrated.  Had a headache and was not happy that she is on this for appointment with the surgeon for her anal fissure and hemorrhoids tomorrow.  Complaining of a headache and a migraine.  Also has some abdominal pain and continues to have some diarrhea.  No other concerns or complaints at this time.  Objective: Vitals:   08/16/19 0857 08/16/19 0955 08/16/19 1056 08/16/19 1156  BP: 122/68 129/84 92/76 114/72  Pulse: (!) 130 (!) 129 (!) 119 (!) 115  Resp: (!) 24 (!) 22 16 16   Temp: 100 F (37.8 C) 99.1 F (37.3 C) 99.7 F (37.6 C) 100.3 F (37.9 C)  TempSrc: Oral Oral Oral Oral  SpO2:  95% 97% 96%  Weight:      Height:        Intake/Output Summary (Last 24 hours) at 08/16/2019 1232 Last data  filed at 08/16/2019 0263 Gross per 24 hour  Intake 2089.83 ml  Output 350 ml  Net 1739.83 ml   Filed Weights   08/12/19 1127 08/15/19 2109  Weight: 102.1 kg 104.1 kg   Examination: Physical Exam:  Constitutional: WN/WD morbidly obese African-American female currently frustrated and appears slightly uncomfortable Eyes: Lids and conjunctivae normal, sclerae anicteric  ENMT: External Ears, Nose appear normal. Grossly normal hearing. Neck: Appears normal, supple, no cervical masses, normal ROM, no appreciable thyromegaly; no JVD Respiratory: Diminished to auscultation bilaterally, no wheezing, rales, rhonchi or crackles. Normal respiratory effort and patient is not tachypenic. No accessory  muscle use.  Unlabored breathing Cardiovascular: Tachycardic rate but regular rhtyhm, no murmurs / rubs / gallops. S1 and S2 auscultated. 1+ LE extremity edema. Abdomen: Soft, Tender, Distended due to body habitus. Bowel sounds positive x4.  GU: Deferred. Musculoskeletal: No clubbing / cyanosis of digits/nails. No joint deformity upper and lower extremities.  Skin: No rashes, lesions, ulcers on a limited. No induration; Warm and dry.  Neurologic: CN 2-12 grossly intact with no focal deficits. Romberg sign and cerebellar reflexes not assessed.  Psychiatric: Normal judgment and insight. Alert and oriented x 3. Anxious and Frustrated mood and appropriate affect.   Data Reviewed: I have personally reviewed following labs and imaging studies  CBC: Recent Labs  Lab 08/12/19 1229 08/13/19 0337 08/13/19 2040 08/14/19 0808 08/15/19 0645 08/16/19 0740  WBC 11.3* 7.5  --  8.0 9.2 9.6  NEUTROABS 8.2*  --   --   --  6.1 5.8  HGB 10.9* 9.5* 10.3* 9.4* 9.4* 9.3*  HCT 36.0 31.6* 34.3* 30.4* 30.5* 30.2*  MCV 70.7* 71.8*  --  70.4* 71.3* 69.9*  PLT 397 336  --  350 354 335   Basic Metabolic Panel: Recent Labs  Lab 08/12/19 1302 08/13/19 0337 08/14/19 0808 08/15/19 0645 08/16/19 0740  NA 129* 133* 133*  135 132*  K 3.8 3.7 3.9 3.8 3.7  CL 98 107 107 108 104  CO2 19* 18* 18* 21* 18*  GLUCOSE 85 111* 124* 141* 119*  BUN 10 6 <5* <5* <5*  CREATININE 0.77 0.63 0.76 0.68 0.78  CALCIUM 8.0* 7.6* 7.6* 7.8* 7.5*  MG  --   --   --  1.6* 1.9  PHOS  --   --   --   --  1.7*   GFR: Estimated Creatinine Clearance: 107.9 mL/min (by C-G formula based on SCr of 0.78 mg/dL). Liver Function Tests: Recent Labs  Lab 08/12/19 1302 08/13/19 0337 08/15/19 0645 08/16/19 0740  AST 17 14* 13* 23  ALT ALKPHOS 51 46 45 44  BILITOT 0.5 0.5 0.5 0.2*  PROT 7.3 6.9 6.7 6.3*  ALBUMIN 2.6* 2.5* 2.3* 2.1*   Recent Labs  Lab 08/12/19 1302  LIPASE 17   No results for input(s): AMMONIA in the last 168 hours. Coagulation Profile: Recent Labs  Lab 08/15/19 0645  INR 1.4*   Cardiac Enzymes: No results for input(s): CKTOTAL, CKMB, CKMBINDEX, TROPONINI in the last 168 hours. BNP (last 3 results) No results for input(s): PROBNP in the last 8760 hours. HbA1C: No results for input(s): HGBA1C in the last 72 hours. CBG: No results for input(s): GLUCAP in the last 168 hours. Lipid Profile: No results for input(s): CHOL, HDL, LDLCALC, TRIG, CHOLHDL, LDLDIRECT in the last 72 hours. Thyroid Function Tests: No results for input(s): TSH, T4TOTAL, FREET4, T3FREE, THYROIDAB in the last 72 hours. Anemia Panel: Recent Labs    08/15/19 0645  VITAMINB12 1,674*  FERRITIN 114  TIBC 157*  IRON 9*   Sepsis Labs: Recent Labs  Lab 08/15/19 0645 08/15/19 1527  PROCALCITON  --  2.04  LATICACIDVEN 1.1  --     Recent Results (from the past 240 hour(s))  C Difficile Quick Screen w PCR reflex     Status: Abnormal   Collection Time: 08/12/19  1:43 PM   Specimen: Urine, Clean Catch; Stool  Result Value Ref Range Status   C Diff antigen POSITIVE (A) NEGATIVE Final   C Diff toxin NEGATIVE NEGATIVE Final   C Diff interpretation Results  are indeterminate. See PCR results.  Final    Comment: Performed at  [facility], [address] [place], [place] [address]  C. Diff by PCR, Reflexed     Status: Abnormal   Collection Time: 08/12/19  1:43 PM  Result Value Ref Range Status   Toxigenic C. Difficile by PCR POSITIVE (A) NEGATIVE Final    Comment: Positive for toxigenic C. difficile with little to no toxin production. Only treat if clinical presentation suggests symptomatic illness. Performed at [facility], [address] [place], [place] [address]   SARS Coronavirus 2 [facility](Hospital order, Performed in [facility] lab) Nasopharyngeal Nasopharyngeal Swab     Status: None   Collection Time: 08/12/19  2:19 PM   Specimen: Nasopharyngeal Swab  Result Value Ref Range Status   SARS Coronavirus 2 NEGATIVE NEGATIVE Final    Comment: (NOTE) If result is NEGATIVE SARS-CoV-2 target nucleic acids are NOT DETECTED. The SARS-CoV-2 RNA is generally detectable in upper and lower  respiratory specimens during the acute phase of infection. The lowest  concentration of SARS-CoV-2 viral copies this assay can detect is 250  copies / mL. A negative result does not preclude SARS-CoV-2 infection  and should not be used as the sole basis for treatment or other  patient management decisions.  A negative result may occur with  improper specimen collection / handling, submission of specimen other  than nasopharyngeal swab, presence of viral mutation(s) within the  areas targeted by this assay, and inadequate number of viral copies  (<250 copies / mL). A negative result must be combined with clinical  observations, patient history, and epidemiological information. If result is POSITIVE SARS-CoV-2 target nucleic acids are DETECTED. The SARS-CoV-2 RNA is generally detectable in upper and lower  respiratory specimens dur ing the acute phase of infection.  Positive  results are indicative of active infection with SARS-CoV-2.  Clinical  correlation with patient history and other diagnostic information  is  necessary to determine patient infection status.  Positive results do  not rule out bacterial infection or co-infection with other viruses. If result is PRESUMPTIVE POSTIVE SARS-CoV-2 nucleic acids MAY BE PRESENT.   A presumptive positive result was obtained on the submitted specimen  and confirmed on repeat testing.  While 2019 novel coronavirus  (SARS-CoV-2) nucleic acids may be present in the submitted sample  additional confirmatory testing may be necessary for epidemiological  and / or clinical management purposes  to differentiate between  SARS-CoV-2 and other Sarbecovirus currently known to infect humans.  If clinically indicated additional testing with an alternate test  methodology [id number]) is advised. The SARS-CoV-2 RNA is generally  detectable in upper and lower respiratory sp ecimens during the acute  phase of infection. The expected result is Negative. Fact Sheet for Patients:  BoilerBrush.com.cyhttps://www.fda.gov/media/136312/download Fact Sheet for Healthcare Providers: https://pope.com/https://www.fda.gov/media/136313/download This test is not yet approved or cleared by the [place] FDA and has been authorized for detection and/or diagnosis of SARS-CoV-2 by FDA under an Emergency Use Authorization (EUA).  This EUA will remain in effect (meaning this test can be used) for the duration of the COVID-19 declaration under Section 564(b)(1) of the Act, 21 U.S.C. section 360bbb-3(b)(1), unless the authorization is terminated or revoked sooner. Performed at [facility] [place] [facility], [address] [place], [place] [address]   C difficile quick scan w PCR reflex     Status: None   Collection Time: 08/12/19  7:17 PM   Specimen: STOOL  Result Value Ref Range Status   C Diff antigen NEGATIVE NEGATIVE Final   C Diff toxin NEGATIVE NEGATIVE Final   C Diff interpretation No C. difficile detected.  Final    Comment: Performed at [facility], [address], [place], [place] [address]   Culture, blood (routine x 2)     Status: None ([facility]: 08/15/19  3:10 PM   Specimen: BLOOD RIGHT HAND  Result Value Ref Range Status   Specimen Description   Final    BLOOD RIGHT HAND Performed at [facility], [address] [place], [place] [address]    Special Requests   Final    BOTTLES DRAWN AEROBIC ONLY Blood Culture results may not be optimal due to an inadequate volume of blood received in culture bottles Performed at [facility], [address], [place], [place] [address]    Culture   Final    NO GROWTH < 24 HOURS Performed at [facility], [address], [place], [place] [address]    Report Status PENDING  Incomplete  Culture, blood (routine x 2)     Status: None (Preliminary result)   Collection Time: 08/15/19  3:27 PM   Specimen: BLOOD RIGHT HAND  Result Value Ref Range Status   Specimen Description   Final    BLOOD RIGHT HAND Performed at [facility], [address] [place], [place] [address]    Special Requests   Final    BOTTLES DRAWN AEROBIC ONLY Blood Culture adequate volume Performed at [facility], [address], [place], [place] [address]    Culture   Final    NO GROWTH < 24 HOURS Performed at [facility], [address] [place], [place] [address]    Report Status PENDING  Incomplete    Radiology Studies: Ct Abdomen Pelvis W Contrast  Result Date: 08/15/2019 CLINICAL DATA:  38 year old female with sepsis, C difficile colitis. Persistent fever. EXAM: CT ABDOMEN AND PELVIS WITH CONTRAST TECHNIQUE: Multidetector CT imaging of the abdomen and pelvis was performed using the standard protocol following bolus administration of intravenous contrast. CONTRAST:  30mL OMNIPAQUE IOHEXOL 300 MG/ML SOLN, OMNIPAQUE IOHEXOL 300 MG/ML SOLN COMPARISON:  CT Abdomen and Pelvis 08/12/2019. FINDINGS: Lower chest: Increased left lower lobe  atelectasis. Otherwise stable lung bases. No pericardial or pleural effusion. Hepatobiliary: Liver and gallbladder are within normal limits. Pancreas: Negative. Spleen: Negative. Adrenals/Urinary Tract: Normal adrenal glands. Renal enhancement and contrast excretion appears symmetric with normal proximal ureters. Diminutive and unremarkable urinary bladder. Stomach/Bowel: Generalized large bowel wall thickening and edema from the rectum to the hepatic flexure. Associated large bowel mesenteric stranding. Involvement appears most severe from the splenic flexure distally. The cecum and ascending colon are relatively spared and contain oral contrast. The terminal ileum is decompressed and appears mildly thickened also on series 2, image 61. There is trace free fluid in the right lower quadrant abutting the cecum on coronal image 45. The appendix is indistinct throughout its course on coronal image 53, although does not appear to be a separate epicenter of inflammation. There is no gas or contrast within the appendix. There is no dilated small bowel. There is a small volume of free fluid along the jejunum contiguous with a small volume of fluid in the left pericolic gutter on series 2, image 40. Largely decompressed stomach. Questionable small gastric hiatal hernia. No free air. Vascular/Lymphatic: Suboptimal intravascular contrast bolus  but the major arterial structures appear to remain patent. The portal venous system appears to be patent. No lymphadenopathy. Reproductive: Negative. Other: No pelvic free fluid. Mildly increased body wall edema bilaterally. Musculoskeletal: No acute osseous abnormality identified. IMPRESSION: 1. Continued severe generalized colitis. This relatively spares the cecum and ascending colon although secondary involvement of the appendix and terminal ileum is suspected. No bowel obstruction or perforation. 2. A small volume of free fluid appears to be reactive. No abscess or drainable fluid  collection at this time. 3. Mild lung base atelectasis. Electronically Signed   By: Odessa Fleming M.D.   On: 08/15/2019 23:29   Dg Chest Port 1 View  Result Date: 08/15/2019 CLINICAL DATA:  Fever EXAM: PORTABLE CHEST 1 VIEW COMPARISON:  Radiograph 12/03/2018 FINDINGS: Lung volumes are low with some hazy basilar areas of opacity and questionable air bronchograms in the left infrahilar lung. No pneumothorax or effusion. Cardiac size is at the upper limits of normal though this may be accentuated by the portable technique. No acute osseous or soft tissue abnormality. IMPRESSION: Basilar areas of opacity and air bronchograms in the left infrahilar lung, suspicious for pneumonia. Electronically Signed   By: Kreg Shropshire M.D.   On: 08/15/2019 20:01   Dg Abd Portable 1v  Result Date: 08/14/2019 CLINICAL DATA:  Abdominal pain. EXAM: PORTABLE ABDOMEN - 1 VIEW COMPARISON:  CT scan August 12, 2019 FINDINGS: Suggested haustral thickening in the transverse colon. No evidence of bowel obstruction. Fecal loading in the ascending colon. There is a paucity of gas in the descending colon and rectum. No free air, portal venous gas, or pneumatosis. No renal stones identified. No old are normal. IMPRESSION: 1. Suggested haustral thickening in the transverse colon. Given colitis seen in the descending and sigmoid colon on a CT scan from 2 days ago, the findings in the transverse colon are suspicious for colitis. CT imaging could better evaluate. Electronically Signed   By: Gerome Sam III M.D   On: 08/14/2019 13:56   Scheduled Meds: . acidophilus  2 capsule Oral TID  . metoCLOPramide (REGLAN) injection  5 mg Intravenous Q6H  . simethicone  80 mg Oral QID  . vancomycin  500 mg Oral QID   Continuous Infusions: . sodium chloride Stopped (08/12/19 1614)  . dextrose 5 % and 0.9 % NaCl with KCl 20 mEq/L 125 mL/hr at 08/16/19 0526  . metronidazole 500 mg (08/16/19 0528)  . sodium phosphate  Dextrose 5% IVPB      LOS: 4  days   Merlene Laughter, DO [facility] PAGER is on AMION  If 7PM-7AM, please contact night-coverage [email or web address] Password [facility] 08/16/2019, 12:32 PM

## 2019-08-16 NOTE — Consult Note (Signed)
Consultation  Referring Provider: [facility]/ Dr Allena Katz Primary Care Physician:  Patient, No Pcp Per Primary Gastroenterologist: None/unassigned.  Reason for Consultation:  C. difficile colitis  HPI: Nancy Reeves is a 38 y.o. female, who we are asked to see for C. difficile colitis, with minimal  improvement since admission.  Patient was admitted on 08/12/2019 through the emergency room after presenting with complaints of diarrhea over the past month.  She had taken a course of Augmentin in August for dental infection.  On admission she had a mild leukocytosis at 11.3.  CT of the abdomen and pelvis was done showing a colitis extending from the proximal descending colon through the distal sigmoid colon without associated perforation or abscess.  Was also noted to have an enlarged appendix raising question of appendicitis. She was seen in consultation by surgery who felt that appendicitis was highly unlikely, and stool for C. difficile returned positive. She did receive ceftriaxone and metronidazole in the emergency room, then was switched to oral vancomycin at 125 mg 4 times daily and IV metronidazole. She had repeat CT yesterday which showed a continued severe generalized colitis, relatively sparing the cecum and ascending colon, although secondary involvement of the appendix and TI is suspected.  Labs today WBC of 9.6, hemoglobin 9.3 hematocrit of 30.2, MCV of 69.  Creatinine 0.78 She has been tachycardic with heart rate in the 130s this morning, and febrile to 102 -Thousand cc bolus late this a.m. with heart rate now in the 110  Chest x-ray yesterday showed basilar areas of opacity and air bronchograms in the left infrahilar lung suspicious for pneumonia  Vancomycin has been increased to 500 mg 4 times daily.  Patient says she has had some nausea but no vomiting.  She is having intermittent sharp fairly intense abdominal cramps radiate through into her rectum.  She says at her worst she was having  10-12 bowel movements per day at home and is now having about 4 bowel movements per day, which have become less watery.  She had been seen blood intermittently but none over the past 24 hours.    Past Medical History:  Diagnosis Date  . Fibromyalgia   . GERD (gastroesophageal reflux disease)   . Migraine    psuedotumor cerebra  . Pseudotumor cerebri     Past Surgical History:  Procedure Laterality Date  . LUMBAR PUNCTURE      Prior to Admission medications   Medication Sig Start Date End Date Taking? Authorizing Provider  famotidine (PEPCID) 40 MG tablet Take 40 mg by mouth daily.    Yes [provider]  fluconazole (DIFLUCAN) 100 MG tablet Take 100 mg by mouth daily.  08/06/19  Yes [provider]  hyoscyamine (LEVBID) 0.375 MG 12 hr tablet Take 0.375 mg by mouth 2 (two) times daily.  08/08/19  Yes [provider]  tiZANidine (ZANAFLEX) 4 MG capsule Take 4 mg by mouth daily.  08/03/19 09/02/19 Yes [provider]  albuterol (VENTOLIN HFA) 108 (90 Base) MCG/ACT inhaler Inhale 1 puff into the lungs every 4 (four) hours. 04/22/19   [provider]  medroxyPROGESTERone (PROVERA) 10 MG tablet Take 10 mg by mouth daily.  07/08/19   [provider]  metFORMIN (GLUCOPHAGE-XR) 500 MG 24 hr tablet Take 500 mg by mouth at bedtime. 07/08/19   [provider]  DULoxetine (CYMBALTA) 60 MG capsule Take 60 mg by mouth daily.  07/19/19  [provider]  gabapentin (NEURONTIN) 300 MG capsule Take 300  mg by mouth 3 (three) times daily.  07/19/19  [provider]    Current Facility-Administered Medications  Medication Dose Route Frequency Provider Last Rate Last Dose  . 0.9 %  sodium chloride infusion   Intravenous PRN Hayden Rasmussen, MD   Stopped at 08/12/19 1614  . acetaminophen (TYLENOL) tablet 650 mg  650 mg Oral Q6H PRN Guilford Shi, MD   650 mg at 08/16/19 0708  . acidophilus (RISAQUAD) capsule 2 capsule  2 capsule  Oral TID Guilford Shi, MD   2 capsule at 08/16/19 1058  . dextrose 5 % and 0.9 % NaCl with KCl 20 mEq/L infusion   Intravenous Continuous Elwyn Reach, MD 125 mL/hr at 08/16/19 0526    . HYDROcodone-acetaminophen (NORCO/VICODIN) 5-325 MG per tablet 1 tablet  1 tablet Oral Q6H PRN Lavina Hamman, MD   1 tablet at 08/16/19 1107  . metoCLOPramide (REGLAN) injection 5 mg  5 mg Intravenous Q6H Lavina Hamman, MD   5 mg at 08/16/19 1109  . metroNIDAZOLE (FLAGYL) IVPB 500 mg  500 mg Intravenous Q8H Lavina Hamman, MD 100 mL/hr at 08/16/19 0528 500 mg at 08/16/19 0528  . morphine 2 MG/ML injection 2-4 mg  2-4 mg Intravenous Q4H PRN Lavina Hamman, MD   2 mg at 08/16/19 0205  . promethazine (PHENERGAN) injection 12.5 mg  12.5 mg Intravenous Q6H PRN Lavina Hamman, MD   12.5 mg at 08/15/19 1052  . simethicone (MYLICON) chewable tablet 80 mg  80 mg Oral QID Lavina Hamman, MD   80 mg at 08/16/19 1058  . sodium phosphate 20 mmol in dextrose 5 % 250 mL infusion  20 mmol Intravenous Once Sheikh, Omair Latif, DO      . vancomycin (VANCOCIN) 50 mg/mL oral solution 500 mg  500 mg Oral QID Sheikh, Omair [place], DO        Allergies as of 08/12/2019 - Review Complete 08/12/2019  Allergen Reaction Noted  . Triptans Anaphylaxis 01/18/2018    No family history on file.  Social History   Socioeconomic History  . Marital status: Single    Spouse name: Not on file  . Number of children: Not on file  . Years of education: Not on file  . Highest education level: Not on file  Occupational History  . Not on file  Social Needs  . Financial resource strain: Not on file  . Food insecurity    Worry: Not on file    Inability: Not on file  . Transportation needs    Medical: Not on file    Non-medical: Not on file  Tobacco Use  . Smoking status: Never Smoker  . Smokeless tobacco: Never Used  Substance and Sexual Activity  . Alcohol use: Not Currently    Frequency: Never    Comment: occasional   . Drug use: No  . Sexual activity: Not on file  Lifestyle  . Physical activity    Days per week: Not on file    Minutes per session: Not on file  . Stress: Not on file  Relationships  . Social Herbalist on phone: Not on file    Gets together: Not on file    Attends religious service: Not on file    Active member of club or organization: Not on file    Attends meetings of clubs or organizations: Not on file    Relationship status: Not on file  . Intimate partner violence  Fear of current or ex partner: Not on file    Emotionally abused: Not on file    Physically abused: Not on file    Forced sexual activity: Not on file  Other Topics Concern  . Not on file  Social History Narrative  . Not on file    Review of Systems: Pertinent positive and negative review of systems were noted in the above HPI section.  All other review of systems was otherwise negative.  Physical Exam: Vital signs in last 24 hours: Temp:  [99.1 F (37.3 C)-102.9 F (39.4 C)] 100.3 F (37.9 C) (09/28 1156) Pulse Rate:  [115-148] 115 (09/28 1156) Resp:  [16-32] 16 (09/28 1156) BP: (92-144)/(68-93) 114/72 (09/28 1156) SpO2:  [92 %-100 %] 96 % (09/28 1156) Weight:  [104.1 kg] 104.1 kg (09/27 2109) Last BM Date: 08/14/19 General:   Alert,  Well-developed, well-nourished, young African-American female, pleasant and cooperative in NAD, ill-appearing Head:  Normocephalic and atraumatic. Eyes:  Sclera clear, no icterus.   Conjunctiva pink. Ears:  Normal auditory acuity. Nose:  No deformity, discharge,  or lesions. Mouth:  No deformity or lesions.   Neck:  Supple; no masses or thyromegaly. Lungs:  Clear throughout to auscultation.   Decreased breath sounds bases  Heart: T achy Regular rate and rhythm; no murmurs, clicks, rubs,  or gallops. Abdomen:  Soft, obese, sounds fairly quiet at present, rather diffusely tender without guarding Rectal:  Deferred  Msk:  Symmetrical without gross  deformities. . Pulses:  Normal pulses noted. Extremities:  Without clubbing or edema. Neurologic:  Alert and  oriented x4;  grossly normal neurologically. Skin:  Intact without significant lesions or rashes.. Psych:  Alert and cooperative. Normal mood and affect.  Intake/Output from previous day: 09/27 0701 - 09/28 0700 In: 2589.9 [P.O.:100; I.V.:2236.9; IV Piggyback:252.9] Out: 350 [Urine:350] Intake/Output this shift: No intake/output data recorded.  Lab Results: Recent Labs    08/14/19 0808 08/15/19 0645 08/16/19 0740  WBC 8.0 9.2 9.6  HGB 9.4* 9.4* 9.3*  HCT 30.4* 30.5* 30.2*  PLT 350 354 335   BMET Recent Labs    08/14/19 0808 08/15/19 0645 08/16/19 0740  NA 133* 135 132*  K 3.9 3.8 3.7  CL 107 108 104  CO2 18* 21* 18*  GLUCOSE 124* 141* 119*  BUN <5* <5* <5*  CREATININE 0.76 0.68 0.78  CALCIUM 7.6* 7.8* 7.5*   LFT Recent Labs    08/16/19 0740  PROT 6.3*  ALBUMIN 2.1*  AST 23  ALT 16  ALKPHOS 44  BILITOT 0.2*   PT/INR Recent Labs    08/15/19 0645  LABPROT 16.6*  INR 1.4*   Hepatitis Panel No results for input(s): HEPBSAG, HCVAB, HEPAIGM, HEPBIGM in the last 72 hours.    IMPRESSION:  #48 38 year old African-American female with acute C. difficile colitis with SIRS.  Day #4 hospitalization and treatment with vancomycin and metronidazole. Patient has fairly diffuse colitis on CT, without evidence of toxic megacolon or perforation.  She continues with intermittent fevers.  Dose of vancomycin was increased today to 500 mg every 6 hours, continues on IV metronidazole.  WBC has normalized, and stool frequency has decreased. I do not think she has failed current therapy, and would continue current regimen, and allow her a bit more time to recover  #2 tachycardia-may be multifactoral-improved after fluid bolus today, have increased fluids to 125 an hour.  Plan; start sips of clear liquids DC Reglan Change Zofran to around-the-clock Add  dicyclomine 10 mg p.o.  3 times daily Check lactate Keep up with volume replacement Add Florastor twice daily Plain films in a.m. Thank you we will follow with you     Amy Esterwood PA-C 08/16/2019, 1:13 PM

## 2019-08-16 NOTE — Progress Notes (Signed)
Vitals: 100.64F;HR122;RR28;134/83 (MAP 93);96% Room Air. PCP was notified

## 2019-08-16 NOTE — Progress Notes (Signed)
CRITICAL VALUE ALERT  Critical Value: Lactic Acid 2.3  Date & Time Notied: 08/16/19, 1623  Provider Notified: Kerney Elbe, MD  notified at 1625  Orders Received/Actions taken: Notified via text page. Will watch for new orders.

## 2019-08-16 NOTE — Progress Notes (Signed)
Vitals : 103F;HR131;RR28;135/87 (MAP 103);94 % Room Air. PCP was notified

## 2019-08-16 NOTE — Progress Notes (Addendum)
Patient MEWs red, HR 133, RR 32, T 102.9 (oral), patient given PRN tylenol. Charge nurse was notified. Patient is stable, resting in bed, no complaints at this time. Charge nurse notified Dr. Alfredia Ferguson. No new orders at this time. Red MEWs sheet initiated, patient is currently being treated with antibiotics.     Vital Signs MEWS/VS Documentation      08/15/2019 2130 08/16/2019 0138 08/16/2019 0205 08/16/2019 0654   MEWS Score:  3  3  2  7    MEWS Score Color:  Yellow  Yellow  Yellow  Red   Resp:  -  -  20  (!) 32   Pulse:  -  -  (!) 125  (!) 133   BP:  -  -  (!) 143/84  137/83   Temp:  -  -  99.5 F (37.5 C)  (!) 102.9 F (39.4 C)   O2 Device:  -  -  Room [employer]

## 2019-08-17 ENCOUNTER — Inpatient Hospital Stay (HOSPITAL_COMMUNITY): Payer: BC Managed Care – PPO

## 2019-08-17 LAB — PHOSPHORUS: Phosphorus: 1.7 mg/dL — ABNORMAL LOW (ref 2.5–4.6)

## 2019-08-17 LAB — GI PATHOGEN PANEL BY PCR, STOOL
Adenovirus F 40/41: NOT DETECTED
Astrovirus: NOT DETECTED
C difficile toxin A/B: DETECTED — AB
Campylobacter by PCR: NOT DETECTED
Cryptosporidium by PCR: NOT DETECTED
Cyclospora cayetanensis: NOT DETECTED
E coli (ETEC) LT/ST: NOT DETECTED
E coli (STEC): NOT DETECTED
E coli 0157 by PCR: NOT DETECTED
Entamoeba histolytica: NOT DETECTED
Enteroaggregative E coli: NOT DETECTED
Enteropathogenic E coli: NOT DETECTED
G lamblia by PCR: NOT DETECTED
Norovirus GI/GII: NOT DETECTED
Plesiomonas shigelloides: NOT DETECTED
Rotavirus A by PCR: NOT DETECTED
Salmonella by PCR: NOT DETECTED
Sapovirus: NOT DETECTED
Shigella by PCR: NOT DETECTED
Vibrio cholerae: NOT DETECTED
Vibrio: NOT DETECTED
Yersinia enterocolitica: NOT DETECTED

## 2019-08-17 LAB — CBC WITH DIFFERENTIAL/PLATELET
Abs Immature Granulocytes: 0.07 10*3/uL (ref 0.00–0.07)
Basophils Absolute: 0.1 10*3/uL (ref 0.0–0.1)
Basophils Relative: 1 %
Eosinophils Absolute: 0.3 10*3/uL (ref 0.0–0.5)
Eosinophils Relative: 3 %
HCT: 27.1 % — ABNORMAL LOW (ref 36.0–46.0)
Hemoglobin: 8.3 g/dL — ABNORMAL LOW (ref 12.0–15.0)
Immature Granulocytes: 1 %
Lymphocytes Relative: 16 %
Lymphs Abs: 1.5 10*3/uL (ref 0.7–4.0)
MCH: 21.6 pg — ABNORMAL LOW (ref 26.0–34.0)
MCHC: 30.6 g/dL (ref 30.0–36.0)
MCV: 70.4 fL — ABNORMAL LOW (ref 80.0–100.0)
Monocytes Absolute: 2.3 10*3/uL — ABNORMAL HIGH (ref 0.1–1.0)
Monocytes Relative: 25 %
Neutro Abs: 4.8 10*3/uL (ref 1.7–7.7)
Neutrophils Relative %: 54 %
Platelets: 315 10*3/uL (ref 150–400)
RBC: 3.85 MIL/uL — ABNORMAL LOW (ref 3.87–5.11)
RDW: 16.6 % — ABNORMAL HIGH (ref 11.5–15.5)
WBC: 8.9 10*3/uL (ref 4.0–10.5)
nRBC: 0 % (ref 0.0–0.2)

## 2019-08-17 LAB — COMPREHENSIVE METABOLIC PANEL
ALT: 27 U/L (ref 0–44)
AST: 44 U/L — ABNORMAL HIGH (ref 15–41)
Albumin: 1.8 g/dL — ABNORMAL LOW (ref 3.5–5.0)
Alkaline Phosphatase: 37 U/L — ABNORMAL LOW (ref 38–126)
Anion gap: 5 (ref 5–15)
BUN: <5 mg/dL — ABNORMAL LOW (ref 6–20)
CO2: 22 mmol/L (ref 22–32)
Calcium: 7.3 mg/dL — ABNORMAL LOW (ref 8.9–10.3)
Chloride: 107 mmol/L (ref 98–111)
Creatinine, Ser: 0.62 mg/dL (ref 0.44–1.00)
GFR calc Af Amer: >60 mL/min (ref >60)
GFR calc non Af Amer: >60 mL/min (ref >60)
Glucose, Bld: 120 mg/dL — ABNORMAL HIGH (ref 70–99)
Potassium: 3.7 mmol/L (ref 3.5–5.1)
Sodium: 134 mmol/L — ABNORMAL LOW (ref 135–145)
Total Bilirubin: 0.1 mg/dL — ABNORMAL LOW (ref 0.3–1.2)
Total Protein: 5.7 g/dL — ABNORMAL LOW (ref 6.5–8.1)

## 2019-08-17 LAB — MAGNESIUM: Magnesium: 1.7 mg/dL (ref 1.7–2.4)

## 2019-08-17 MED ORDER — SODIUM PHOSPHATES 45 MMOLE/15ML IV SOLN
20.0000 mmol | Freq: Once | INTRAVENOUS | Status: AC
  Administered 2019-08-17: 20 mmol via INTRAVENOUS
  Filled 2019-08-17: qty 6.67

## 2019-08-17 MED ORDER — MAGNESIUM SULFATE 2 GM/50ML IV SOLN
2.0000 g | Freq: Once | INTRAVENOUS | Status: AC
  Administered 2019-08-17: 2 g via INTRAVENOUS
  Filled 2019-08-17: qty 50

## 2019-08-17 NOTE — Progress Notes (Signed)
Patient's Vitals were: 101F,HR 112;RR20;143/99 (MAP  112);99% Room Air.  PCP was notified

## 2019-08-17 NOTE — Progress Notes (Signed)
Patient ID: Nancy Reeves, female   DOB: 1981/11/03, 38 y.o.   MRN: [id number]    Progress Note   Subjective  Day # 5 CC : cdiff colitis  She says she feels a little better today, no nausea and is starting to get an appetite She would like to try advancing diet.  Somewhat less abdominal pain, less cramping. She has had 2 bowel movements today thus far.  Patient commented that the odor has resolved.  T-max 103  Lactate 2.3 yesterday KUB -persistent haustral thickening in the transverse colon consistent with colitis. BC - neg thus far  WBC 8.9, hemoglobin 8.3   Objective   Vital signs in last 24 hours: Temp:  [98.8 F (37.1 C)-103 F (39.4 C)] 99.2 F (37.3 C) (09/29 0858) Pulse Rate:  [109-131] 112 (09/29 0511) Resp:  [16-30] 20 (09/29 0858) BP: (92-149)/(72-99) 146/97 (09/29 0858) SpO2:  [94 %-99 %] 94 % (09/29 0858) Last BM Date: 08/14/19 General:    African-American female in NAD Heart:  Regular rate and rhythm; no murmurs Lungs: Respirations even and unlabored, lungs CTA bilaterally Abdomen:  Soft, obese, she has rather generalized tenderness, no guarding or rebound, and nondistended.  Bowel sounds decreased currently. Extremities:  Without edema. Neurologic:  Alert and oriented,  grossly normal neurologically. Psych:  Cooperative. Normal mood and affect.  Intake/Output from previous day: 09/28 0701 - 09/29 0700 In: 3584.1 [P.O.:280; I.V.:1961.9; IV Piggyback:1342.2] Out: 200 [Urine:200] Intake/Output this shift: No intake/output data recorded.  Lab Results: Recent Labs    08/15/19 0645 08/16/19 0740 08/17/19 0819  WBC 9.2 9.6 PENDING  HGB 9.4* 9.3* 8.3*  HCT 30.5* 30.2* 27.1*  PLT 354 335 315   BMET Recent Labs    08/15/19 0645 08/16/19 0740  NA 135 132*  K 3.8 3.7  CL 108 104  CO2 21* 18*  GLUCOSE 141* 119*  BUN <5* <5*  CREATININE 0.68 0.78  CALCIUM 7.8* 7.5*   LFT Recent Labs    08/16/19 0740  PROT 6.3*  ALBUMIN 2.1*  AST 23  ALT 16    ALKPHOS 44  BILITOT 0.2*   PT/INR Recent Labs    08/15/19 0645  LABPROT 16.6*  INR 1.4*    Studies/Results: Ct Abdomen Pelvis W Contrast  Result Date: 08/15/2019 CLINICAL DATA:  38 year old female with sepsis, C difficile colitis. Persistent fever. EXAM: CT ABDOMEN AND PELVIS WITH CONTRAST TECHNIQUE: Multidetector CT imaging of the abdomen and pelvis was performed using the standard protocol following bolus administration of intravenous contrast. CONTRAST:  64mL OMNIPAQUE IOHEXOL 300 MG/ML SOLN, 126mL OMNIPAQUE IOHEXOL 300 MG/ML SOLN COMPARISON:  CT Abdomen and Pelvis 08/12/2019. FINDINGS: Lower chest: Increased left lower lobe atelectasis. Otherwise stable lung bases. No pericardial or pleural effusion. Hepatobiliary: Liver and gallbladder are within normal limits. Pancreas: Negative. Spleen: Negative. Adrenals/Urinary Tract: Normal adrenal glands. Renal enhancement and contrast excretion appears symmetric with normal proximal ureters. Diminutive and unremarkable urinary bladder. Stomach/Bowel: Generalized large bowel wall thickening and edema from the rectum to the hepatic flexure. Associated large bowel mesenteric stranding. Involvement appears most severe from the splenic flexure distally. The cecum and ascending colon are relatively spared and contain oral contrast. The terminal ileum is decompressed and appears mildly thickened also on series 2, image 61. There is trace free fluid in the right lower quadrant abutting the cecum on coronal image 45. The appendix is indistinct throughout its course on coronal image 53, although does not appear to be a separate epicenter of inflammation. There is  no gas or contrast within the appendix. There is no dilated small bowel. There is a small volume of free fluid along the jejunum contiguous with a small volume of fluid in the left pericolic gutter on series 2, image 40. Largely decompressed stomach. Questionable small gastric hiatal hernia. No free air.  Vascular/Lymphatic: Suboptimal intravascular contrast bolus but the major arterial structures appear to remain patent. The portal venous system appears to be patent. No lymphadenopathy. Reproductive: Negative. Other: No pelvic free fluid. Mildly increased body wall edema bilaterally. Musculoskeletal: No acute osseous abnormality identified. IMPRESSION: 1. Continued severe generalized colitis. This relatively spares the cecum and ascending colon although secondary involvement of the appendix and terminal ileum is suspected. No bowel obstruction or perforation. 2. A small volume of free fluid appears to be reactive. No abscess or drainable fluid collection at this time. 3. Mild lung base atelectasis. Electronically Signed   By: Odessa Fleming M.D.   On: 08/15/2019 23:29   Dg Chest Port 1 View  Result Date: 08/15/2019 CLINICAL DATA:  Fever EXAM: PORTABLE CHEST 1 VIEW COMPARISON:  Radiograph 12/03/2018 FINDINGS: Lung volumes are low with some hazy basilar areas of opacity and questionable air bronchograms in the left infrahilar lung. No pneumothorax or effusion. Cardiac size is at the upper limits of normal though this may be accentuated by the portable technique. No acute osseous or soft tissue abnormality. IMPRESSION: Basilar areas of opacity and air bronchograms in the left infrahilar lung, suspicious for pneumonia. Electronically Signed   By: Kreg Shropshire M.D.   On: 08/15/2019 20:01       Assessment / Plan:    #49 38 year old female with acute C. difficile colitis with SIRS Continues to have intermittent fevers, elevated lactate but normal WBC.  No evidence for toxic megacolon on plain films today. Despite fevers, clinically otherwise she seems to be gradually improving with less diarrhea, less abdominal pain, resolution of nausea.   #2  tachycardia-improved-continues on IV fluids at 150 an hour #3 anemia, normocytic  Plan ; continue vancomycin 500 mg p.o. 4 times daily Continue IV metronidazole Will  advance to soft bland diet Continue dicyclomine   Principal Problem:   C. Diff Colitis Active Problems:   Hyponatremia   Leucocytosis   C. difficile colitis     LOS: 5 days   Derry Arbogast PA-C  08/17/2019, 9:08 AM

## 2019-08-17 NOTE — Progress Notes (Signed)
PROGRESS NOTE    Nancy Reeves  ZOX:[id number] DOB: 04-09-1981 DOA: 08/12/2019 PCP: Patient, No Pcp Per  Brief Narrative: The patient is a morbidly obese 38 year old African-American female with a past medical history significant for but not limited to GERD, pseudotumor cerebri, fibromyalgia, morbid obesity, as well as other comorbidities who presented to the ED with a chief complaint of abdominal pain, as well as diarrhea for about 4 weeks.  She had received Augmentin on August 22 for dental infection and abscess and she reports 8 episodes of diarrhea a day with abdominal pain that was a 7 out of 10 in severity.  Further work-up showed that she had a colitis on CT scan that was extending from the proximal descending colon to the distal sigmoid colon with question of possible appendicitis but no perforation or abscess.  Dr. Maisie Fus of general surgery evaluated after she is contacted and felt that she did not have any appendicitis and recommended medical management.  In the ED she received ceftriaxone, metronidazole as well as normal saline.  Further work-up revealed that her stool test was positive for toxigenic C. difficile via PCR.  COVID-19 testing was negative.  She is admitted for C. difficile colitis and admitted to the hospital service and started on p.o. vancomycin.  Because she continued to be septic and had a lack of improvement a repeat CT scan was done which showed continued severe generalized colitis that relatively spared the cecum and ascending colon although secondary involvement of the appendix and terminal ileum was suspected but no evidence of bowel obstruction or perforation.  I discussed the case with ID Dr. Orvan Falconer who stated to increase her vancomycin to 500 mg 4 times daily given her lack of response and I have also contacted Gastroenterology for further evaluation recommendations.  Patient states that her diarrhea is slowing down slightly and "the smell is gone".  Still having  difficult time taking p.o. vancomycin as it "makes her nauseous" gastroenterology advance diet and will continue dual therapy for severe C. difficile.  Fluids are being continued at D5 normal saline at 150 mL's per hour.  Assessment & Plan:   Principal Problem:   C. Diff Colitis Active Problems:   Hyponatremia   Leucocytosis   C. difficile colitis   Sepsis secondary to C. difficile Colitis. -Presents with complaints of abdominal pain and was febrile, tachycardic, tachypneic and has a source with colitis. -CT abdomen shows evidence of colitis as well as possible appendicitis. Patient recently was on antibiotic and started to have diarrhea first followed by abdominal pain. -C. difficile PCR positive for toxigenic strain. -General surgery was consulted who has ruled out possibility of appendicitis and signed off for now. -Patient was initially treated with oral vancomycin as well as Cipro and Flagyl which is currently discontinued. -We will continue with oral vancomycin but add IV Flagyl given patient's persistent fever and increased dose from 125 mg 4 times daily to 500 mg 4 times daily at the recommendation with Dr. Orvan Falconer -X-ray abdomen on 08/14/2019 was not showing any evidence of significant toxic megacolon. -Due to persistent fever we will get chest x-ray repeat blood cultures repeat urine analysis as well as a repeat CT scan of the abdomen with contrast to identify anything hidden pathology. -Repeat CT Abdomen and Pelvis showed "Continued severe generalized colitis. This relatively spares the cecum and ascending colon although secondary involvement of the appendix and terminal ileum is suspected. No bowel obstruction or perforation. A small volume of free fluid appears to  be reactive. No abscess or drainable fluid collection at this time. Mild lung base atelectasis." -Discussed with Infectious Diseases Dr. Orvan Falconer who recommended increasing p.o. vancomycin and states that she may need  vancomycin enemas -Continue with oral vancomycin 500 mg 4 times daily along with IV Flagyl -Gastroenterology consulted as well and advancing diet and she is now on a soft diet -Continue IV fluid hydration D5 normal saline at a rate of 150 mL's per hour -GI also started the patient on Dicyclomine -Continue to Monitor on Telemetry and low threshold to transfer to stepdown unit if not improving significantly -Gastroenterology changed her to [facility] as well  Rectal Bleeding -In the setting of above and history of anal fissure/hemorrhoids.  Hemoglobin did drop to 8.3 today -Continue to Monitor closely and transfuse as needed.   -Will have GI weigh in for further Recc's -Currently has not had any further bleeding so far  Abnormal CT scan -Raising suspicion for appendicitis.   -Seen by General Surgery and cleared with recommendations of no surgical intervention.  GERD -Avoid PPI given C. difficile colitis and will also avoid famotidine 40 mg p.o. daily  History of Migraine/Pseudotumor Cerebri -Follows with [facility] neurology.   -Complaining of a migraine this morning and I looked at her allergies and looks like that she is allergic to triptans -Continue to treat symptomatically and may need alternative medications and given acetaminophen  Hyponatremia -Likely hypovolemic hyponatremia in the setting of above problems -Sodium went from 135 and is now 1 34 -Given bolus of normal saline 1 L this morning -Continue D5 normal saline +20 mEq of KCl at 125 mL's per hour for now given her sepsis but this was increased to 150 mL's per hour by gastroenterology -Given IV sodium phosphate 20 mmol again -Continue monitor and trend and repeat CMP in a.m.  Sinus Tachycardia. -In response to severe infection.  Improving slightly -Given another 1 L bolus this morning and will need to continue to monitor on telemetry -Continue with IV fluid hydration as above  Microcytic anemia and Iron  deficiency anemia. -Likely secondary to menstrual blood loss. -Patient did have some rectal bleeding associated with her hemorrhoids as well that can be also the cause of chronic blood loss. -Continue to monitor for signs and symptoms of bleeding; currently no active bleeding. -Patient's anemia panel showed an iron level of 9, U IBC of 148, TIBC 157, saturation ratios of 6%, ferritin level 114, vitamin B12 level 1674 -Patient's hemoglobin/hematocrit is relatively stable and is now 8.3/27.1 -Once sepsis resolves patient will require IV Feraheme. -Repeat CBC in a.m.  Hypomagnesemia. -Patient mag level is now 1.7 -Replete with IV mag sulfate 2 g -Continue to monitor and replete as necessary -Repeat magnesium level in the AM  Poor p.o. intake. -Patient has been in the hospital for 3 days and has poor p.o. intake and currently was n.p.o. -I advanced diet to clear liquid diet yesterday and GI is advancing to a soft diet today -Concerning for nutritional status. -We will monitor progression in the next 48 hours and she may need enteral nutrition   Morbid Obesity -Estimated body mass index is 41.98 kg/m as calculated from the following:   Height as of this encounter:  (1.575 m).   Weight as of this encounter: 104.1 kg. -Weight Loss and Dietary Counseling given   Hypophosphatemia Patient's phosphorus level this morning is 1.7 -Replete with IV sodium phosphate first 20 mmol -Continue monitor and replete as necessary -Repeat phosphorus level in a.m.  Metabolic Acidosis, improved -Patient CO2 was 18, chloride was 104, and anion gap is 10 -Now CO2 is 22/chloride is 107, and anion gap is 5 -In the setting of C. difficile colitis -Continue to monitor and continue IV fluid hydration and if necessary will switch to sodium bicarbonate -Repeat CMP in a.m.  Hyperglycemia -In the setting of infection but will need to rule out diabetes component given her body habitus and obesity -Check  hemoglobin A1c in a.m -Continue to monitor blood sugars carefully and if necessary will place on sensitive NovoLog/scale AC  Abnormal AST  -in the setting of infection  -AST is now 44  -Continue to monitor and trend -If continues to worsen or not improving will obtain a right upper quadrant ultrasound as well as an acute hepatitis panel -Repeat CMP in a.m.  DVT prophylaxis: No Code Status: FULL CODE  Family Communication: No family present at bedside  Disposition Plan: Remain Inpatient for continued workup and treatment  Consultants:   Discussed with ID Dr. Orvan Falconer  Gastroenterology   General Surgery  Procedures: None   Antimicrobials:  Anti-infectives (From admission, onward)   Start     Dose/Rate Route Frequency Ordered Stop   09/18/19 1000  vancomycin (VANCOCIN) 50 mg/mL oral solution 125 mg  Status:  Discontinued     125 mg Oral Every 3 DAYS 08/12/19 1920 08/12/19 1934   09/10/19 1000  vancomycin (VANCOCIN) 50 mg/mL oral solution 125 mg  Status:  Discontinued     125 mg Oral Every other day 08/12/19 1920 08/12/19 1934   09/03/19 1000  vancomycin (VANCOCIN) 50 mg/mL oral solution 125 mg  Status:  Discontinued     125 mg Oral Daily 08/12/19 1920 08/12/19 1934   08/26/19 2200  vancomycin (VANCOCIN) 50 mg/mL oral solution 125 mg  Status:  Discontinued     125 mg Oral 2 times daily 08/12/19 1920 08/12/19 1934   08/16/19 1400  vancomycin (VANCOCIN) 50 mg/mL oral solution 500 mg     500 mg Oral 4 times daily 08/16/19 1231 08/22/19 2159   08/15/19 1445  metroNIDAZOLE (FLAGYL) IVPB 500 mg     500 mg 100 mL/hr over 60 Minutes Intravenous Every 8 hours 08/15/19 1441     08/12/19 2200  metroNIDAZOLE (FLAGYL) IVPB 500 mg  Status:  Discontinued     500 mg 100 mL/hr over 60 Minutes Intravenous Every 8 hours 08/12/19 1918 08/13/19 1126   08/12/19 2200  vancomycin (VANCOCIN) 50 mg/mL oral solution 125 mg  Status:  Discontinued     125 mg Oral 4 times daily 08/12/19 1920 08/16/19  1231   08/12/19 2200  ciprofloxacin (CIPRO) IVPB 400 mg  Status:  Discontinued     400 mg 200 mL/hr over 60 Minutes Intravenous Every 12 hours 08/12/19 1926 08/13/19 1126   08/12/19 1930  vancomycin (VANCOCIN) 50 mg/mL oral solution 125 mg  Status:  Discontinued     125 mg Oral 4 times daily 08/12/19 1920 08/12/19 1934   08/12/19 1500  metroNIDAZOLE (FLAGYL) IVPB 500 mg     500 mg 100 mL/hr over 60 Minutes Intravenous  Once 08/12/19 1454 08/12/19 1607   08/12/19 1415  cefTRIAXone (ROCEPHIN) 2 g in sodium chloride 0.9 % 100 mL IVPB     2 g 200 mL/hr over 30 Minutes Intravenous  Once 08/12/19 1404 08/12/19 1448     Subjective: Seen and examined at bedside feeling a little bit better but states that she has a difficult time taking her vancomycin  given that it causes nausea.  States that she had at least 3 episodes of diarrhea and that some of them started being a little formed.  States "smell is not as bad".  No chest pain, lightheadedness or dizziness.  No other concerns reported at this time and did not have a headache today.  Objective: Vitals:   08/17/19 0106 08/17/19 0511 08/17/19 0858 08/17/19 1109  BP: 130/85 (!) 143/99 (!) 146/97 131/89  Pulse: (!) 109 (!) 112  (!) 112  Resp: 20 20 20 20   Temp: 98.8 F (37.1 C) (!) 101 F (38.3 C) 99.2 F (37.3 C) 99.7 F (37.6 C)  TempSrc: Oral Oral Oral Oral  SpO2: 96% 99% 94% 98%  Weight:      Height:        Intake/Output Summary (Last 24 hours) at 08/17/2019 1433 Last data filed at 08/17/2019 0330 Gross per 24 hour  Intake 2584.06 ml  Output --  Net 2584.06 ml   Filed Weights   08/12/19 1127 08/15/19 2109  Weight: 102.1 kg 104.1 kg   Examination: Physical Exam:  Constitutional: WN/WD morbidly obese African-American female appears slightly uncomfortable but is calm today Eyes: Lids and conjunctivae normal, sclerae anicteric  ENMT: External Ears, Nose appear normal. Grossly normal hearing. Mucous membranes are moist. Neck:  Appears normal, supple, no cervical masses, normal ROM, no appreciable thyromegaly; no JVD Respiratory: Diminished to auscultation bilaterally, no wheezing, rales, rhonchi or crackles. Normal respiratory effort and patient is not tachypenic. No accessory muscle use.  Cardiovascular: Mildly tachycardic rate, no murmurs / rubs / gallops. S1 and S2 auscultated. 1+ LE extremity edema.  Abdomen: Soft, Tender, Distended due to body habitus. No masses palpated. No appreciable hepatosplenomegaly. Bowel sounds positive x4.  GU: Deferred. Musculoskeletal: No clubbing / cyanosis of digits/nails. No joint deformity upper and lower extremities.  Skin: No rashes, lesions, ulcers on a limited skin evaluation. No induration; Warm and dry.  Neurologic: CN 2-12 grossly intact with no focal deficits. Romberg sign and cerebellar reflexes not assessed.  Psychiatric: Normal judgment and insight. Alert and oriented x 3. Has a pleasant mood today and not as anxious   Data Reviewed: I have personally reviewed following labs and imaging studies  CBC: Recent Labs  Lab 08/12/19 1229 08/13/19 0337 08/13/19 2040 08/14/19 0808 08/15/19 0645 08/16/19 0740 08/17/19 0819  WBC 11.3* 7.5  --  8.0 9.2 9.6 8.9  NEUTROABS 8.2*  --   --   --  6.1 5.8 4.8  HGB 10.9* 9.5* 10.3* 9.4* 9.4* 9.3* 8.3*  HCT 36.0 31.6* 34.3* 30.4* 30.5* 30.2* 27.1*  MCV 70.7* 71.8*  --  70.4* 71.3* 69.9* 70.4*  PLT 397 336  --  350 354 335 315   Basic Metabolic Panel: Recent Labs  Lab 08/13/19 0337 08/14/19 0808 08/15/19 0645 08/16/19 0740 08/17/19 0819  NA 133* 133* 135 132* 134*  K 3.7 3.9 3.8 3.7 3.7  CL 107 107 108 104 107  CO2 18* 18* 21* 18* 22  GLUCOSE 111* 124* 141* 119* 120*  BUN 6 <5* <5* <5* <5*  CREATININE 0.63 0.76 0.68 0.78 0.62  CALCIUM 7.6* 7.6* 7.8* 7.5* 7.3*  MG  --   --  1.6* 1.9 1.7  PHOS  --   --   --  1.7* 1.7*   GFR: Estimated Creatinine Clearance: 107.9 mL/min (by C-G formula based on SCr of 0.62  mg/dL). Liver Function Tests: Recent Labs  Lab 08/12/19 1302 08/13/19 0337 08/15/19 0645 08/16/19 0740 08/17/19  0819  AST 17 14* 13* 23 44*  ALT ALKPHOS 51 46 45 44 37*  BILITOT 0.5 0.5 0.5 0.2* 0.1*  PROT 7.3 6.9 6.7 6.3* 5.7*  ALBUMIN 2.6* 2.5* 2.3* 2.1* 1.8*   Recent Labs  Lab 08/12/19 1302  LIPASE 17   No results for input(s): AMMONIA in the last 168 hours. Coagulation Profile: Recent Labs  Lab 08/15/19 0645  INR 1.4*   Cardiac Enzymes: No results for input(s): CKTOTAL, CKMB, CKMBINDEX, TROPONINI in the last 168 hours. BNP (last 3 results) No results for input(s): PROBNP in the last 8760 hours. HbA1C: No results for input(s): HGBA1C in the last 72 hours. CBG: No results for input(s): GLUCAP in the last 168 hours. Lipid Profile: No results for input(s): CHOL, HDL, LDLCALC, TRIG, CHOLHDL, LDLDIRECT in the last 72 hours. Thyroid Function Tests: No results for input(s): TSH, T4TOTAL, FREET4, T3FREE, THYROIDAB in the last 72 hours. Anemia Panel: Recent Labs    08/15/19 0645  VITAMINB12 1,674*  FERRITIN 114  TIBC 157*  IRON 9*   Sepsis Labs: Recent Labs  Lab 08/15/19 0645 08/15/19 1527 08/16/19 1535  PROCALCITON  --  2.04  --   LATICACIDVEN 1.1  --  2.3*    Recent Results (from the past 240 hour(s))  C Difficile Quick Screen w PCR reflex     Status: Abnormal   Collection Time: 08/12/19  1:43 PM   Specimen: Urine, Clean Catch; Stool  Result Value Ref Range Status   C Diff antigen POSITIVE (A) NEGATIVE Final   C Diff toxin NEGATIVE NEGATIVE Final   C Diff interpretation Results are indeterminate. See PCR results.  Final    Comment: Performed at [facility], [address], [place], [place] [address]  C. Diff by PCR, Reflexed     Status: Abnormal   Collection Time: 08/12/19  1:43 PM  Result Value Ref Range Status   Toxigenic C. Difficile by PCR POSITIVE (A) NEGATIVE Final    Comment: Positive for toxigenic C. difficile with  little to no toxin production. Only treat if clinical presentation suggests symptomatic illness. Performed at [facility], [address], [place], [place] [address]   SARS Coronavirus 2 [facility] order, Performed in [facility] hospital lab) Nasopharyngeal Nasopharyngeal Swab     Status: None   Collection Time: 08/12/19  2:19 PM   Specimen: Nasopharyngeal Swab  Result Value Ref Range Status   SARS Coronavirus 2 NEGATIVE NEGATIVE Final    Comment: (NOTE) If result is NEGATIVE SARS-CoV-2 target nucleic acids are NOT DETECTED. The SARS-CoV-2 RNA is generally detectable in upper and lower  respiratory specimens during the acute phase of infection. The lowest  concentration of SARS-CoV-2 viral copies this assay can detect is 250  copies / mL. A negative result does not preclude SARS-CoV-2 infection  and should not be used as the sole basis for treatment or other  patient management decisions.  A negative result may occur with  improper specimen collection / handling, submission of specimen other  than nasopharyngeal swab, presence of viral mutation(s) within the  areas targeted by this assay, and inadequate number of viral copies  (<250 copies / mL). A negative result must be combined with clinical  observations, patient history, and epidemiological information. If result is POSITIVE SARS-CoV-2 target nucleic acids are DETECTED. The SARS-CoV-2 RNA is generally detectable in upper and lower  respiratory specimens dur ing the acute phase of infection.  Positive  results are indicative of active infection with SARS-CoV-2.  Clinical  correlation with patient history and other diagnostic information is  necessary to determine patient infection status.  Positive results do  not rule out bacterial infection or co-infection with other viruses. If result is PRESUMPTIVE POSTIVE SARS-CoV-2 nucleic acids MAY BE PRESENT.   A presumptive positive result was obtained on the submitted specimen   and confirmed on repeat testing.  While 2019 novel coronavirus  (SARS-CoV-2) nucleic acids may be present in the submitted sample  additional confirmatory testing may be necessary for epidemiological  and / or clinical management purposes  to differentiate between  SARS-CoV-2 and other Sarbecovirus currently known to infect humans.  If clinically indicated additional testing with an alternate test  methodology [id number]) is advised. The SARS-CoV-2 RNA is generally  detectable in upper and lower respiratory sp ecimens during the acute  phase of infection. The expected result is Negative. Fact Sheet for Patients:  BoilerBrush.com.cyhttps://www.fda.gov/media/136312/download Fact Sheet for Healthcare Providers: https://pope.com/https://www.fda.gov/media/136313/download This test is not yet approved or cleared by the [place] FDA and has been authorized for detection and/or diagnosis of SARS-CoV-2 by FDA under an Emergency Use Authorization (EUA).  This EUA will remain in effect (meaning this test can be used) for the duration of the COVID-19 declaration under Section 564(b)(1) of the Act, 21 U.S.C. section 360bbb-3(b)(1), unless the authorization is terminated or revoked sooner. Performed at [facility] [place], [address] [place], [place] [address]   C difficile quick scan w PCR reflex     Status: None   Collection Time: 08/12/19  7:17 PM   Specimen: STOOL  Result Value Ref Range Status   C Diff antigen NEGATIVE NEGATIVE Final   C Diff toxin NEGATIVE NEGATIVE Final   C Diff interpretation No C. difficile detected.  Final    Comment: Performed at [facility], [address] [place], [place] [address]  Culture, blood (routine x 2)     Status: None (Preliminary result)   Collection Time: 08/15/19  3:10 PM   Specimen: BLOOD RIGHT HAND  Result Value Ref Range Status   Specimen Description   Final    BLOOD RIGHT HAND Performed at [facility], [address]  [place], [place] [address]    Special Requests   Final    BOTTLES DRAWN AEROBIC ONLY Blood Culture results may not be optimal due to an inadequate volume of blood received in culture bottles Performed at [facility], [address] [place], [place] [address]    Culture   Final    NO GROWTH 2 DAYS Performed at [facility], [address] [place], [place] [address]    Report Status PENDING  Incomplete  Culture, blood (routine x 2)     Status: None (Preliminary result)   Collection Time: 08/15/19  3:27 PM   Specimen: BLOOD RIGHT HAND  Result Value Ref Range Status   Specimen Description   Final    BLOOD RIGHT HAND Performed at [facility], [address] [place], [place] [address]    Special Requests   Final    BOTTLES DRAWN AEROBIC ONLY Blood Culture adequate volume Performed at [facility], [address] [place], [place] [address]    Culture   Final    NO GROWTH 2 DAYS Performed at [facility], [address] [place], [place] [address]    Report Status PENDING  Incomplete  Radiology Studies: Ct Abdomen Pelvis W Contrast  Result Date: 08/15/2019 CLINICAL DATA:  38 year old female with sepsis, C difficile colitis. Persistent fever. EXAM: CT ABDOMEN AND PELVIS WITH CONTRAST TECHNIQUE: Multidetector CT imaging of the abdomen and pelvis was performed using the standard protocol following bolus administration of intravenous contrast. CONTRAST:  21mL OMNIPAQUE IOHEXOL 300 MG/ML SOLN, OMNIPAQUE IOHEXOL 300 MG/ML SOLN COMPARISON:  CT Abdomen and Pelvis 08/12/2019. FINDINGS: Lower chest: Increased left lower lobe atelectasis. Otherwise stable lung bases. No pericardial or pleural effusion. Hepatobiliary: Liver and gallbladder are within normal limits. Pancreas: Negative. Spleen: Negative. Adrenals/Urinary Tract: Normal adrenal glands. Renal enhancement and contrast excretion appears symmetric with normal proximal  ureters. Diminutive and unremarkable urinary bladder. Stomach/Bowel: Generalized large bowel wall thickening and edema from the rectum to the hepatic flexure. Associated large bowel mesenteric stranding. Involvement appears most severe from the splenic flexure distally. The cecum and ascending colon are relatively spared and contain oral contrast. The terminal ileum is decompressed and appears mildly thickened also on series 2, image 61. There is trace free fluid in the right lower quadrant abutting the cecum on coronal image 45. The appendix is indistinct throughout its course on coronal image 53, although does not appear to be a separate epicenter of inflammation. There is no gas or contrast within the appendix. There is no dilated small bowel. There is a small volume of free fluid along the jejunum contiguous with a small volume of fluid in the left pericolic gutter on series 2, image 40. Largely decompressed stomach. Questionable small gastric hiatal hernia. No free air. Vascular/Lymphatic: Suboptimal intravascular contrast bolus but the major arterial structures appear to remain patent. The portal venous system appears to be patent. No lymphadenopathy. Reproductive: Negative. Other: No pelvic free fluid. Mildly increased body wall edema bilaterally. Musculoskeletal: No acute osseous abnormality identified. IMPRESSION: 1. Continued severe generalized colitis. This relatively spares the cecum and ascending colon although secondary involvement of the appendix and terminal ileum is suspected. No bowel obstruction or perforation. 2. A small volume of free fluid appears to be reactive. No abscess or drainable fluid collection at this time. 3. Mild lung base atelectasis. Electronically Signed   By: Odessa Fleming M.D.   On: 08/15/2019 23:29   Dg Chest Port 1 View  Result Date: 08/15/2019 CLINICAL DATA:  Fever EXAM: PORTABLE CHEST 1 VIEW COMPARISON:  Radiograph 12/03/2018 FINDINGS: Lung volumes are low with some hazy  basilar areas of opacity and questionable air bronchograms in the left infrahilar lung. No pneumothorax or effusion. Cardiac size is at the upper limits of normal though this may be accentuated by the portable technique. No acute osseous or soft tissue abnormality. IMPRESSION: Basilar areas of opacity and air bronchograms in the left infrahilar lung, suspicious for pneumonia. Electronically Signed   By: Kreg Shropshire M.D.   On: 08/15/2019 20:01   Dg Abd Acute 2+v W 1v Chest  Result Date: 08/17/2019 CLINICAL DATA:  Acute colitis. EXAM: DG ABDOMEN ACUTE W/ 1V CHEST COMPARISON:  August 14, 2019 FINDINGS: Platelike opacity in left base is consistent with atelectasis. No pneumothorax. The cardiomediastinal silhouette is normal. No other abnormalities in the chest. No free air, portal venous gas, or pneumatosis. Air-fluid levels are seen within the ascending colon. Haustral thickening remains in the transverse colon. No evidence of bowel obstruction. IMPRESSION: 1. Continued haustral thickening in the transverse colon consistent with colitis. Air-fluid levels in the ascending colon on upright views. Electronically Signed   By: Gerome Sam III  M.D   On: 08/17/2019 10:21   Scheduled Meds:  dicyclomine  10 mg Oral TID AC   saccharomyces boulardii  250 mg Oral BID   simethicone  80 mg Oral QID   vancomycin  500 mg Oral QID   Continuous Infusions:  sodium chloride Stopped (08/12/19 1614)   dextrose 5 % and 0.9 % NaCl with KCl 20 mEq/L 150 mL/hr at 08/17/19 0558   metronidazole 500 mg (08/17/19 1337)    LOS: 5 days   Kerney Elbe, DO [facility] PAGER is on AMION  If 7PM-7AM, please contact night-coverage [email or web address] Password [facility] 08/17/2019, 2:33 PM

## 2019-08-18 DIAGNOSIS — A0472 Enterocolitis due to Clostridium difficile, not specified as recurrent: Secondary | ICD-10-CM

## 2019-08-18 DIAGNOSIS — D72829 Elevated white blood cell count, unspecified: Secondary | ICD-10-CM

## 2019-08-18 LAB — COMPREHENSIVE METABOLIC PANEL
ALT: 33 U/L (ref 0–44)
AST: 49 U/L — ABNORMAL HIGH (ref 15–41)
Albumin: 2.1 g/dL — ABNORMAL LOW (ref 3.5–5.0)
Alkaline Phosphatase: 42 U/L (ref 38–126)
Anion gap: 7 (ref 5–15)
BUN: <5 mg/dL — ABNORMAL LOW (ref 6–20)
CO2: 22 mmol/L (ref 22–32)
Calcium: 7.5 mg/dL — ABNORMAL LOW (ref 8.9–10.3)
Chloride: 105 mmol/L (ref 98–111)
Creatinine, Ser: 0.58 mg/dL (ref 0.44–1.00)
GFR calc Af Amer: >60 mL/min (ref >60)
GFR calc non Af Amer: >60 mL/min (ref >60)
Glucose, Bld: 120 mg/dL — ABNORMAL HIGH (ref 70–99)
Potassium: 3.8 mmol/L (ref 3.5–5.1)
Sodium: 134 mmol/L — ABNORMAL LOW (ref 135–145)
Total Bilirubin: 0.5 mg/dL (ref 0.3–1.2)
Total Protein: 6 g/dL — ABNORMAL LOW (ref 6.5–8.1)

## 2019-08-18 LAB — CBC WITH DIFFERENTIAL/PLATELET
Abs Immature Granulocytes: 0.1 10*3/uL — ABNORMAL HIGH (ref 0.00–0.07)
Basophils Absolute: 0.1 10*3/uL (ref 0.0–0.1)
Basophils Relative: 1 %
Eosinophils Absolute: 0.4 10*3/uL (ref 0.0–0.5)
Eosinophils Relative: 4 %
HCT: 28.2 % — ABNORMAL LOW (ref 36.0–46.0)
Hemoglobin: 8.5 g/dL — ABNORMAL LOW (ref 12.0–15.0)
Immature Granulocytes: 1 %
Lymphocytes Relative: 13 %
Lymphs Abs: 1.3 10*3/uL (ref 0.7–4.0)
MCH: 21.5 pg — ABNORMAL LOW (ref 26.0–34.0)
MCHC: 30.1 g/dL (ref 30.0–36.0)
MCV: 71.4 fL — ABNORMAL LOW (ref 80.0–100.0)
Monocytes Absolute: 1.8 10*3/uL — ABNORMAL HIGH (ref 0.1–1.0)
Monocytes Relative: 19 %
Neutro Abs: 5.8 10*3/uL (ref 1.7–7.7)
Neutrophils Relative %: 62 %
Platelets: 326 10*3/uL (ref 150–400)
RBC: 3.95 MIL/uL (ref 3.87–5.11)
RDW: 16.7 % — ABNORMAL HIGH (ref 11.5–15.5)
WBC: 9.5 10*3/uL (ref 4.0–10.5)
nRBC: 0.4 % — ABNORMAL HIGH (ref 0.0–0.2)

## 2019-08-18 LAB — LACTIC ACID, PLASMA: Lactic Acid, Venous: 1.4 mmol/L (ref 0.5–1.9)

## 2019-08-18 LAB — HEMOGLOBIN A1C
Hgb A1c MFr Bld: 6.4 % — ABNORMAL HIGH (ref 4.8–5.6)
Mean Plasma Glucose: 136.98 mg/dL

## 2019-08-18 LAB — PHOSPHORUS: Phosphorus: 2.5 mg/dL (ref 2.5–4.6)

## 2019-08-18 LAB — MAGNESIUM: Magnesium: 2.2 mg/dL (ref 1.7–2.4)

## 2019-08-18 MED ORDER — VANCOMYCIN HCL 250 MG PO CAPS: 500.0000 mg | ORAL_CAPSULE | Freq: Four times a day (QID) | ORAL | 0 refills | Status: DC

## 2019-08-18 MED ORDER — DICYCLOMINE HCL 10 MG PO CAPS: 10.0000 mg | ORAL_CAPSULE | Freq: Three times a day (TID) | ORAL | 0 refills | Status: DC

## 2019-08-18 MED ORDER — PROMETHAZINE HCL 12.5 MG PO TABS: 12.5000 mg | ORAL_TABLET | Freq: Three times a day (TID) | ORAL | 0 refills | Status: DC | PRN

## 2019-08-18 MED ORDER — SACCHAROMYCES BOULARDII 250 MG PO CAPS: 250.0000 mg | ORAL_CAPSULE | Freq: Two times a day (BID) | ORAL | 0 refills | Status: DC

## 2019-08-18 NOTE — Discharge Instructions (Addendum)
Clostridioides Difficile Infection °Clostridioides difficile (C. diff) infection is caused by germs (bacteria). The infection causes irritation and swelling of the colon (colitis). It also causes watery poop (diarrhea). °This infection can be passed from person to person (is contagious). You may also get C. diff from food or water, or from touching surfaces that have the germs on them. °What are the causes? °· Certain bacteria normally live in the colon and help to digest food. This infection develops when the balance of bacteria in the colon is changed and the C. diff bacteria grow out of control. This is often caused by taking antibiotics. °What increases the risk? °· Taking antibiotics for a long time. °· Taking certain antibiotics that kill a wide range of bacteria. °· Being in the hospital. °· Being older than 39 years of age. °· Living in a place where there is a lot of contact with others, such as a nursing home. °· Having had a C. diff infection before. °· Having a weak defense (immune) system. °· Taking a medicine called a proton pump inhibitor over a long period of time. °· Having serious underlying conditions, such as colon cancer. °· Having had a gastrointestinal (GI) tract procedure or surgery. °What are the signs or symptoms? °· Diarrhea. This may be bloody, watery, yellow, or green in color. °· Fever. °· Fatigue. °· Loss of appetite. °· Nausea. °· Swelling, pain, or tenderness in the abdomen. °How is this treated? °· Stopping the antibiotics that you were on when the C. diff infection began. Do this only as told by your doctor. °· Taking certain antibiotics to stop C. diff from growing. °· Taking donor poop from a healthy person and placing it into the colon (fecal transplant). °· Having surgery to remove the infected part of the colon. This is rare. °Follow these instructions at home: °Eating and drinking ° °· Eat bland foods in small amounts as you are able. These foods  include: °? Bananas. °? Applesauce. °? Rice. °? Low-fat (lean) meats. °? Toast. °? Crackers. °· Follow your doctor's instructions on how to get enough fluids into your body (rehydrate). Drink clear fluids, such as: °? Water. °? Ice chips. °? Fruit juice that you have added water to (diluted). °? Low-calorie sports drinks. °? An ORS (oral rehydration solution). You can buy an ORS at a pharmacy or store. °· Avoid drinking: °? Milk. °? Caffeine. °? Alcohol. °· Drink enough fluid to keep your pee (urine) pale yellow. °General instructions °· Take over-the-counter and prescription medicines only as told by your doctor. °· Take your antibiotic medicine as told by your doctor. Do not stop taking the antibiotic even if you start to feel better. You may stop taking it only if your doctor tells you to stop. °· Do not use medicines to help with watery poop. You may use these medicines only if your doctor tells you to. °· Keep all follow-up visits as told by your doctor. This is important. °Prevention ° °· Hand hygiene °? Wash your hands well before you cook and after you use the bathroom. Make sure that people who live with you also wash their hands often. Use soap and water. °? If you are being treated in a hospital or clinic, make sure: °? That all doctors and nurses wash their hands with soap and water before they touch you. °? That all visitors wash their hands with soap and water before they touch you. °· Contact precautions °? If you get watery poop while   you are in the hospital or nursing home, let your doctor know right away. °? When you visit someone in the hospital or nursing home, follow the rules for wearing a gown or gloves. °? If possible, avoid contact with people who have watery poop. °· Clean environment °? Clean surfaces with a product that has chlorine bleach in it. °? If you are in the hospital, make sure that the staff cleans the surfaces in your room each day. Tell someone right away if body fluids have  spilled in your room. °? Use chlorine bleach and high heat when cleaning dirty clothing or sheets. °Contact a doctor if: °· Your symptoms do not get better. °· Your symptoms get worse. °· Your symptoms go away and then come back. °· You have a fever. °· You have new symptoms. °Get help right away if: °· You have more pain or tenderness in your belly (abdomen). °· Your poop (stool) is bloody. °· Your poop looks dark black and tarry. °· You cannot eat or drink without throwing up (vomiting). °· You have signs of not having enough fluids in your body (dehydration). These include: °? Dark pee, very little pee, or no pee. °? Cracked lips. °? No tears when you cry. °? Dry mouth. °? Sunken eyes. °? Feeling sleepy. °? Feeling weak. °? Feeling dizzy. °Summary °· C. diff infection causes watery poop. °· This infection may happen after taking antibiotic medicines. °· The infection can be passed from person to person. °· Washing your hands with soap and water can help keep C. diff from spreading. °This information is not intended to replace advice given to you by your health care provider. Make sure you discuss any questions you have with your health care provider. °Document Released: 09/01/2009 Document Revised: 07/09/2018 Document Reviewed: 07/09/2018 °Elsevier Patient Education © 2020 [employer] ° °

## 2019-08-18 NOTE — Progress Notes (Addendum)
Patient ID: Nancy Reeves, female   DOB: Jan 29, 1981, 38 y.o.   MRN: [id number]    Progress Note   Subjective  No diarrhea and little abd pain Feels better Solid diet Ready to go home   Objective   Vital signs in last 24 hours: Temp:  [98.7 F (37.1 C)-100.2 F (37.9 C)] 100.2 F (37.9 C) (09/30 0412) Pulse Rate:  [109-116] 112 (09/30 0412) Resp:  [16-20] 18 (09/30 0412) BP: (121-144)/(78-89) 144/88 (09/30 0412) SpO2:  [93 %-98 %] 93 % (09/30 0412) Last BM Date: 08/17/19 General:  black female in NAD Abdomen:  Soft, nontender and nondistended. Extremities:  Without edema. Neurologic:  Alert and oriented,  grossly normal neurologically. Psych:  Cooperative. Normal mood and affect.   Lab Results: Recent Labs    08/16/19 0740 08/17/19 0819 08/18/19 0530  WBC 9.6 8.9 9.5  HGB 9.3* 8.3* 8.5*  HCT 30.2* 27.1* 28.2*  PLT 335 315 326   BMET Recent Labs    08/16/19 0740 08/17/19 0819 08/18/19 0530  NA 132* 134* 134*  K 3.7 3.7 3.8  CL 104 107 105  CO2 18* 22 22  GLUCOSE 119* 120* 120*  BUN <5* <5* <5*  CREATININE 0.78 0.62 0.58  CALCIUM 7.5* 7.3* 7.5*   LFT Recent Labs    08/18/19 0530  PROT 6.0*  ALBUMIN 2.1*  AST 49*  ALT 33  ALKPHOS 42  BILITOT 0.5        Assessment / Plan:      Day #6  Cdiff colitis /SIRS  She is improved and ready for dc.   Rec:  2 weeks totatl vancomycin 125 mg qid  Obtain a PCP  She will see Nicoletta Ba, PA-C 10/8 130 PM   Gatha Mayer, MD, [facility] 08/18/2019 11:23 AM Pager [phone]        LOS: 6 days   Amy Trellis Paganini  08/18/2019, 9:32 AM

## 2019-08-18 NOTE — Discharge Summary (Signed)
Physician Discharge Summary  Nancy Reeves ZOX:[id number] DOB: 1981-04-14 DOA: 08/12/2019  PCP: Patient, No Pcp Per  Admit date: 08/12/2019 Discharge date: 08/18/2019  Admitted From: Home Disposition: Home  Recommendations for Outpatient Follow-up:  1. Follow up with PCP and gastroenterology in 1-2 weeks 2. Please obtain CBC/BMP/Mag at follow up 3. Please follow up on the following pending results: None  Home Health: None Equipment/Devices: None  Discharge Condition: Stable CODE STATUS: Full code  Hospital Course: 38 year old female with history of GERD, pseudotumor cerebri, fibromyalgia and morbid obesity presenting with abdominal pain and diarrhea for 4 weeks and admitted for sepsis secondary to C. difficile colitis likely precipitated by antibiotic use.  Reportedly treated with dental infection/abscess by Augmentin starting August 07/10/2019.  CT abdomen and pelvis revealed colitis extending from proximal descending colon to distal sigmoid colon with question of possible appendicitis but no perforation or abscess.  Stool test was positive for toxigenic C. difficile via PCR.  She was started on p.o. vancomycin without significant improvement.  General surgery consulted and did not feel she has appendicitis Repeat CT scan revealed severe generalized colitis not relatively sparing the cecum and ascending colon although secondary involvement of the appendix and terminal ileum was suspected but no evidence of bowel obstruction or perforation.  Gastroenterology and ID consulted.  ID recommended increasing vancomycin to 500 mg 4 times daily and adding IV Flagyl.  Patient symptoms improved with the above adjustments of antibiotic.   On the day of discharge, diarrhea and abdominal pain resolved.  She felt well to go home.  She was cleared by gastroenterology for discharge home on oral vancomycin.  ID recommended continuing oral vancomycin 500 mg 4 times daily for a total of 2 weeks.  See  individual problem list below for more hospital course.  Discharge Diagnoses:  Sepsis due to fulminant C. difficile colitis -Treated with oral vancomycin 500 mg 4 times daily and IV Flagyl -Discharged on oral vancomycin 500 mg 4 times daily per GI and ID recommendation. -Was given Rx for Florastor and dicyclomine per GI recommendation. -Advised to dose avoid antibiotics -Follow-up with GI in 2 weeks.  Rectal bleed: Likely due to the above.  Also have history of anal fissure or hemorrhoids. -Recommend repeat CBC at follow-up -Could benefit from further evaluation once C. difficile colitis resolved.  History of migraine/pseudotumor cerebra: Stable. -Followed by neurology  Hyponatremia: Resolved.  Microcytic anemia/iron deficiency anemia -H&H stable. -Could benefit from ferrous sulfate once C. difficile resolved.  Hypokalemia/hypomagnesemia/hypophosphatemia: Likely due to diarrhea and poor p.o. intake.  Resolved.  Mild elevated liver enzymes: Stable. -Recheck LFT at follow-up.  Prediabetes/morbid obesity: BMI 42.  A1c 6.4%. -Start metformin until she fully recovers from C. Difficile. -Encourage lifestyle change to lose weight.  Discharge Instructions  Discharge Instructions    Call MD for:   Complete by: As directed    Worsening diarrhea or abdominal pain   Call MD for:  difficulty breathing, headache or visual disturbances   Complete by: As directed    Call MD for:  extreme fatigue   Complete by: As directed    Call MD for:  persistant dizziness or light-headedness   Complete by: As directed    Call MD for:  persistant nausea and vomiting   Complete by: As directed    Call MD for:  severe uncontrolled pain   Complete by: As directed    Call MD for:  temperature >100.4   Complete by: As directed    Diet general  Complete by: As directed    Increase activity slowly   Complete by: As directed      Allergies as of 08/18/2019      Reactions   Triptans Anaphylaxis       Medication List    STOP taking these medications   hyoscyamine 0.375 MG 12 hr tablet Commonly known as: LEVBID     TAKE these medications   albuterol 108 (90 Base) MCG/ACT inhaler Commonly known as: VENTOLIN HFA Inhale 1 puff into the lungs every 4 (four) hours.   dicyclomine 10 MG capsule Commonly known as: BENTYL Take 1 capsule (10 mg total) by mouth 3 (three) times daily before meals.   famotidine 40 MG tablet Commonly known as: PEPCID Take 40 mg by mouth daily.   fluconazole 100 MG tablet Commonly known as: DIFLUCAN Take 100 mg by mouth daily.   medroxyPROGESTERone 10 MG tablet Commonly known as: PROVERA Take 10 mg by mouth daily.   metFORMIN 500 MG 24 hr tablet Commonly known as: GLUCOPHAGE-XR Take 500 mg by mouth at bedtime.   saccharomyces boulardii 250 MG capsule Commonly known as: FLORASTOR Take 1 capsule (250 mg total) by mouth 2 (two) times daily.   tiZANidine 4 MG capsule Commonly known as: ZANAFLEX Take 4 mg by mouth daily.   vancomycin 250 MG capsule Commonly known as: VANCOCIN Take 2 capsules (500 mg total) by mouth 4 (four) times daily.      Follow-up Information    [facility]. Schedule an appointment as soon as possible for a visit in 2 week(s).   Specialty: Gastroenterology Contact information: [address] [place] [address] [phone]          Consultations:  General surgery, infectious disease, gastroenterology  Procedures/Studies:  2D Echo: None  Ct Abdomen Pelvis W Contrast  Result Date: 08/15/2019 CLINICAL DATA:  38 year old female with sepsis, C difficile colitis. Persistent fever. EXAM: CT ABDOMEN AND PELVIS WITH CONTRAST TECHNIQUE: Multidetector CT imaging of the abdomen and pelvis was performed using the standard protocol following bolus administration of intravenous contrast. CONTRAST:  30mL OMNIPAQUE IOHEXOL 300 MG/ML SOLN, OMNIPAQUE IOHEXOL 300 MG/ML SOLN  COMPARISON:  CT Abdomen and Pelvis 08/12/2019. FINDINGS: Lower chest: Increased left lower lobe atelectasis. Otherwise stable lung bases. No pericardial or pleural effusion. Hepatobiliary: Liver and gallbladder are within normal limits. Pancreas: Negative. Spleen: Negative. Adrenals/Urinary Tract: Normal adrenal glands. Renal enhancement and contrast excretion appears symmetric with normal proximal ureters. Diminutive and unremarkable urinary bladder. Stomach/Bowel: Generalized large bowel wall thickening and edema from the rectum to the hepatic flexure. Associated large bowel mesenteric stranding. Involvement appears most severe from the splenic flexure distally. The cecum and ascending colon are relatively spared and contain oral contrast. The terminal ileum is decompressed and appears mildly thickened also on series 2, image 61. There is trace free fluid in the right lower quadrant abutting the cecum on coronal image 45. The appendix is indistinct throughout its course on coronal image 53, although does not appear to be a separate epicenter of inflammation. There is no gas or contrast within the appendix. There is no dilated small bowel. There is a small volume of free fluid along the jejunum contiguous with a small volume of fluid in the left pericolic gutter on series 2, image 40. Largely decompressed stomach. Questionable small gastric hiatal hernia. No free air. Vascular/Lymphatic: Suboptimal intravascular contrast bolus but the major arterial structures appear to remain patent. The portal venous system appears to be  patent. No lymphadenopathy. Reproductive: Negative. Other: No pelvic free fluid. Mildly increased body wall edema bilaterally. Musculoskeletal: No acute osseous abnormality identified. IMPRESSION: 1. Continued severe generalized colitis. This relatively spares the cecum and ascending colon although secondary involvement of the appendix and terminal ileum is suspected. No bowel obstruction or  perforation. 2. A small volume of free fluid appears to be reactive. No abscess or drainable fluid collection at this time. 3. Mild lung base atelectasis. Electronically Signed   By: Odessa Fleming M.D.   On: 08/15/2019 23:29   Ct Abdomen Pelvis W Contrast  Result Date: 08/12/2019 CLINICAL DATA:  Abdo pain and diarrhea EXAM: CT ABDOMEN AND PELVIS WITH CONTRAST TECHNIQUE: Multidetector CT imaging of the abdomen and pelvis was performed using the standard protocol following bolus administration of intravenous contrast. Oral contrast was also administered. CONTRAST:  OMNIPAQUE IOHEXOL 300 MG/ML  SOLN COMPARISON:  None. FINDINGS: Lower chest: There is atelectatic change in the anterior left base. A lesser degree of atelectasis is noted in the posterior left base. Lungs bases elsewhere clear. Hepatobiliary: No focal liver lesions are demonstrable. The gallbladder wall is not appreciably thickened. There is no evident biliary duct dilatation. Pancreas: There is no pancreatic mass or inflammatory focus. Spleen: No splenic lesions are evident. Adrenals/Urinary Tract: Adrenals bilaterally appear normal. Kidneys bilaterally show no evident mass or hydronephrosis on either side. There is a 1 mm calculus in the upper pole left kidney. A second 1 mm calculus is noted in the mid left kidney. There is no evident ureteral calculus on either side. Urinary bladder is midline with wall thickness within normal limits. Stomach/Bowel: There is wall thickening throughout the descending colon arising slightly distal to the sigmoid colon with involvement of the remainder of the descending colon and sigmoid colon to the level of the rectum. Rectum appears unremarkable. No appreciable diverticular disease is noted in this area. There is slight soft tissue stranding immediately adjacent to the left colon at the sites of wall thickening. Elsewhere, there is no appreciable bowel wall thickening. No bowel obstruction evident. No free air or  portal venous air. The terminal ileum appears unremarkable. No evident intramural air. Vascular/Lymphatic: No abdominal aortic aneurysm. No vascular lesions are evident. Mesenteric arterial vessels appear widely patent. There is no adenopathy by size criteria in the abdomen or pelvis. There are scattered subcentimeter mesenteric lymph nodes, primarily in the right abdomen. Reproductive: The uterus is anteverted, slightly canted to the right. No pelvic masses evident. Other: Appendix appears prominent measuring 9 mm in thickness. There is no appreciable appendiceal wall thickening or enhancement. There is no periappendiceal region fluid. There is equivocal soft tissue stranding in this area. There is no abscess or ascites in the abdomen or pelvis. Musculoskeletal: No blastic or lytic bone lesions. No intramuscular or abdominal wall lesions are evident. IMPRESSION: 1. The appendix measures 9 mm in thickness which is enlarged. There is equivocal soft tissue stranding in the region of the appendix. This appearance must be viewed as concerning for early acute appendiceal inflammation. No appendiceal abscess or air. Several subcentimeter lymph nodes are noted in this area which may well have inflammatory etiology. Surgical consultation with respect to the appearance of the right lower quadrant is warranted. Appendix: Location: Appendix arises inferiorly from the cecum and extends inferiorly and medially in the upper right pelvis. Diameter: 9 mm Appendicolith: None Mucosal hyper-enhancement: None Extraluminal gas: None Periappendiceal collection: None. Slight periappendiceal soft tissue stranding noted. 2. There is colitis extending from the  proximal descending colon through the distal sigmoid colon without associated perforation or abscess. No diverticular disease noted in this area. Etiology for this colitis uncertain. Infectious colitis statistically is the most likely etiology for this finding. 3. 1 mm calculi in the  left kidney. No ureteral calculus or hydronephrosis on either side. Urinary bladder wall thickness within normal limits. Critical Value/emergent results were called by telephone at the time of interpretation on 08/12/2019 at 1:18 pm to providerMICHAEL BUTLER , who verbally acknowledged these results. Electronically Signed   By: Bretta Bang III M.D.   On: 08/12/2019 13:18   Dg Chest Port 1 View  Result Date: 08/15/2019 CLINICAL DATA:  Fever EXAM: PORTABLE CHEST 1 VIEW COMPARISON:  Radiograph 12/03/2018 FINDINGS: Lung volumes are low with some hazy basilar areas of opacity and questionable air bronchograms in the left infrahilar lung. No pneumothorax or effusion. Cardiac size is at the upper limits of normal though this may be accentuated by the portable technique. No acute osseous or soft tissue abnormality. IMPRESSION: Basilar areas of opacity and air bronchograms in the left infrahilar lung, suspicious for pneumonia. Electronically Signed   By: Kreg Shropshire M.D.   On: 08/15/2019 20:01   Dg Abd Acute 2+v W 1v Chest  Result Date: 08/17/2019 CLINICAL DATA:  Acute colitis. EXAM: DG ABDOMEN ACUTE W/ 1V CHEST COMPARISON:  August 14, 2019 FINDINGS: Platelike opacity in left base is consistent with atelectasis. No pneumothorax. The cardiomediastinal silhouette is normal. No other abnormalities in the chest. No free air, portal venous gas, or pneumatosis. Air-fluid levels are seen within the ascending colon. Haustral thickening remains in the transverse colon. No evidence of bowel obstruction. IMPRESSION: 1. Continued haustral thickening in the transverse colon consistent with colitis. Air-fluid levels in the ascending colon on upright views. Electronically Signed   By: Gerome Sam III M.D   On: 08/17/2019 10:21   Dg Abd Portable 1v  Result Date: 08/14/2019 CLINICAL DATA:  Abdominal pain. EXAM: PORTABLE ABDOMEN - 1 VIEW COMPARISON:  CT scan August 12, 2019 FINDINGS: Suggested haustral thickening  in the transverse colon. No evidence of bowel obstruction. Fecal loading in the ascending colon. There is a paucity of gas in the descending colon and rectum. No free air, portal venous gas, or pneumatosis. No renal stones identified. No old are normal. IMPRESSION: 1. Suggested haustral thickening in the transverse colon. Given colitis seen in the descending and sigmoid colon on a CT scan from 2 days ago, the findings in the transverse colon are suspicious for colitis. CT imaging could better evaluate. Electronically Signed   By: Gerome Sam III M.D   On: 08/14/2019 13:56      Subjective: No major events overnight of this morning.  Had one more formed bowel movement last night.  No further bowel movement.  Abdominal pain resolved.  Denies nausea, vomiting, chest pain, dyspnea or UTI symptoms.  Feels well and ready to go home.   Discharge Exam: Vitals:   08/17/19 2329 08/18/19 0412  BP: 135/89 (!) 144/88  Pulse: (!) 109 (!) 112  Resp: 17 18  Temp: 98.7 F (37.1 C) 100.2 F (37.9 C)  SpO2: 96% 93%    GENERAL: No acute distress.  Appears well.  HEENT: MMM.  Vision and hearing grossly intact.  NECK: Supple.  No JVD.  LUNGS:  No IWOB. Good air movement bilaterally. HEART:  RRR. Heart sounds normal.  ABD: Bowel sounds present. Soft. Non tender.  MSK/EXT:  Moves all extremities. No apparent deformity.  No edema bilaterally. SKIN: no apparent skin lesion or wound NEURO: Awake, alert and oriented appropriately.  No gross deficit.  PSYCH: Calm. Normal affect.   The results of significant diagnostics from this hospitalization (including imaging, microbiology, ancillary and laboratory) are listed below for reference.     Microbiology: Recent Results (from the past 240 hour(s))  C Difficile Quick Screen w PCR reflex     Status: Abnormal   Collection Time: 08/12/19  1:43 PM   Specimen: Urine, Clean Catch; Stool  Result Value Ref Range Status   C Diff antigen POSITIVE (A) NEGATIVE Final     C Diff toxin NEGATIVE NEGATIVE Final   C Diff interpretation Results are indeterminate. See PCR results.  Final    Comment: Performed at [facility], [place] [address], [place], [place] [address]  C. Diff by PCR, Reflexed     Status: Abnormal   Collection Time: 08/12/19  1:43 PM  Result Value Ref Range Status   Toxigenic C. Difficile by PCR POSITIVE (A) NEGATIVE Final    Comment: Positive for toxigenic C. difficile with little to no toxin production. Only treat if clinical presentation suggests symptomatic illness. Performed at [facility], [place] [address], [place], [place] [address]   SARS Coronavirus 2 [facility] order, Performed in [facility] hospital lab) Nasopharyngeal Nasopharyngeal Swab     Status: None   Collection Time: 08/12/19  2:19 PM   Specimen: Nasopharyngeal Swab  Result Value Ref Range Status   SARS Coronavirus 2 NEGATIVE NEGATIVE Final    Comment: (NOTE) If result is NEGATIVE SARS-CoV-2 target nucleic acids are NOT DETECTED. The SARS-CoV-2 RNA is generally detectable in upper and lower  respiratory specimens during the acute phase of infection. The lowest  concentration of SARS-CoV-2 viral copies this assay can detect is 250  copies / mL. A negative result does not preclude SARS-CoV-2 infection  and should not be used as the sole basis for treatment or other  patient management decisions.  A negative result may occur with  improper specimen collection / handling, submission of specimen other  than nasopharyngeal swab, presence of viral mutation(s) within the  areas targeted by this assay, and inadequate number of viral copies  (<250 copies / mL). A negative result must be combined with clinical  observations, patient history, and epidemiological information. If result is POSITIVE SARS-CoV-2 target nucleic acids are DETECTED. The SARS-CoV-2 RNA is generally detectable in upper and lower  respiratory specimens dur ing the acute phase of infection.   Positive  results are indicative of active infection with SARS-CoV-2.  Clinical  correlation with patient history and other diagnostic information is  necessary to determine patient infection status.  Positive results do  not rule out bacterial infection or co-infection with other viruses. If result is PRESUMPTIVE POSTIVE SARS-CoV-2 nucleic acids MAY BE PRESENT.   A presumptive positive result was obtained on the submitted specimen  and confirmed on repeat testing.  While 2019 novel coronavirus  (SARS-CoV-2) nucleic acids may be present in the submitted sample  additional confirmatory testing may be necessary for epidemiological  and / or clinical management purposes  to differentiate between  SARS-CoV-2 and other Sarbecovirus currently known to infect humans.  If clinically indicated additional testing with an alternate test  methodology [phone]) is advised. The SARS-CoV-2 RNA is generally  detectable in upper and lower respiratory sp ecimens during the acute  phase of infection. The expected result is Negative. Fact Sheet for Patients:  StrictlyIdeas.no  Fact Sheet for Healthcare Providers: https://pope.com/ This test is not yet approved or cleared by the [place] FDA and has been authorized for detection and/or diagnosis of SARS-CoV-2 by FDA under an Emergency Use Authorization (EUA).  This EUA will remain in effect (meaning this test can be used) for the duration of the COVID-19 declaration under Section 564(b)(1) of the Act, 21 U.S.C. section 360bbb-3(b)(1), unless the authorization is terminated or revoked sooner. Performed at [facility], [address] [place], [place] [address]   C difficile quick scan w PCR reflex     Status: None   Collection Time: 08/12/19  7:17 PM   Specimen: STOOL  Result Value Ref Range Status   C Diff antigen NEGATIVE NEGATIVE Final   C Diff toxin NEGATIVE NEGATIVE Final   C Diff  interpretation No C. difficile detected.  Final    Comment: Performed at [facility], [address], [place], [place] [address]  Culture, blood (routine x 2)     Status: None (Preliminary result)   Collection Time: 08/15/19  3:10 PM   Specimen: BLOOD RIGHT HAND  Result Value Ref Range Status   Specimen Description   Final    BLOOD RIGHT HAND Performed at [facility], [address], [place], [place] [address]    Special Requests   Final    BOTTLES DRAWN AEROBIC ONLY Blood Culture results may not be optimal due to an inadequate volume of blood received in culture bottles Performed at [facility], [address] [place], [place] [address]    Culture   Final    NO GROWTH 3 DAYS Performed at [facility], [address], [place], [place] [address]    Report Status PENDING  Incomplete  Culture, blood (routine x 2)     Status: None (Preliminary result)   Collection Time: 08/15/19  3:27 PM   Specimen: BLOOD RIGHT HAND  Result Value Ref Range Status   Specimen Description   Final    BLOOD RIGHT HAND Performed at [facility], [address], [place], [place] [address]    Special Requests   Final    BOTTLES DRAWN AEROBIC ONLY Blood Culture adequate volume Performed at [facility], [address] [place], [place] [address]    Culture   Final    NO GROWTH 3 DAYS Performed at [facility], [address] [place], [place] [address]    Report Status PENDING  Incomplete     Labs: BNP (last 3 results) No results for input(s): BNP in the last 8760 hours. Basic Metabolic Panel: Recent Labs  Lab 08/14/19 0808 08/15/19 0645 08/16/19 0740 08/17/19 0819 08/18/19 0530  NA 133* 135 132* 134* 134*  K 3.9 3.8 3.7 3.7 3.8  CL 107 108 104 107 105  CO2 18* 21* 18* 22 22  GLUCOSE 124* 141* 119* 120* 120*  BUN <5* <5* <5* <5* <5*  CREATININE 0.76 0.68 0.78 0.62 0.58  CALCIUM  7.6* 7.8* 7.5* 7.3* 7.5*  MG  --  1.6* 1.9 1.7 2.2  PHOS  --   --  1.7* 1.7* 2.5   Liver Function Tests: Recent Labs  Lab 08/13/19 0337 08/15/19 0645 08/16/19 0740 08/17/19 0819 08/18/19 0530  AST 14* 13* 23 44* 49*  ALT 33  ALKPHOS 46 45 44 37* 42  BILITOT 0.5 0.5 0.2* 0.1* 0.5  PROT 6.9 6.7 6.3*  5.7* 6.0*  ALBUMIN 2.5* 2.3* 2.1* 1.8* 2.1*   Recent Labs  Lab 08/12/19 1302  LIPASE 17   No results for input(s): AMMONIA in the last 168 hours. CBC: Recent Labs  Lab 08/12/19 1229  08/14/19 0808 08/15/19 0645 08/16/19 0740 08/17/19 0819 08/18/19 0530  WBC 11.3*   < > 8.0 9.2 9.6 8.9 9.5  NEUTROABS 8.2*  --   --  6.1 5.8 4.8 5.8  HGB 10.9*   < > 9.4* 9.4* 9.3* 8.3* 8.5*  HCT 36.0   < > 30.4* 30.5* 30.2* 27.1* 28.2*  MCV 70.7*   < > 70.4* 71.3* 69.9* 70.4* 71.4*  PLT 397   < > 350 354 335 315 326   < > = values in this interval not displayed.   Cardiac Enzymes: No results for input(s): CKTOTAL, CKMB, CKMBINDEX, TROPONINI in the last 168 hours. BNP: Invalid input(s): POCBNP CBG: No results for input(s): GLUCAP in the last 168 hours. D-Dimer No results for input(s): DDIMER in the last 72 hours. Hgb A1c Recent Labs    08/18/19 0530  HGBA1C 6.4*   Lipid Profile No results for input(s): CHOL, HDL, LDLCALC, TRIG, CHOLHDL, LDLDIRECT in the last 72 hours. Thyroid function studies No results for input(s): TSH, T4TOTAL, T3FREE, THYROIDAB in the last 72 hours.  Invalid input(s): FREET3 Anemia work up No results for input(s): VITAMINB12, FOLATE, FERRITIN, TIBC, IRON, RETICCTPCT in the last 72 hours. Urinalysis    Component Value Date/Time   COLORURINE YELLOW 08/15/2019 1442   APPEARANCEUR HAZY (A) 08/15/2019 1442   LABSPEC 1.018 08/15/2019 1442   PHURINE 5.0 08/15/2019 1442   GLUCOSEU NEGATIVE 08/15/2019 1442   HGBUR SMALL (A) 08/15/2019 1442   BILIRUBINUR NEGATIVE 08/15/2019 1442   KETONESUR NEGATIVE 08/15/2019 1442   PROTEINUR NEGATIVE 08/15/2019  1442   UROBILINOGEN 1.0 01/31/2011 1134   NITRITE NEGATIVE 08/15/2019 1442   LEUKOCYTESUR NEGATIVE 08/15/2019 1442   Sepsis Labs Invalid input(s): PROCALCITONIN,  WBC,  LACTICIDVEN   Time coordinating discharge: 35 minutes  SIGNED:  Almon Hercules, MD  [facility] 08/18/2019, 10:51 AM  If 7PM-7AM, please contact night-coverage [email or web address] Password [email or web address]

## 2019-08-20 LAB — CULTURE, BLOOD (ROUTINE X 2)
Culture: NO GROWTH
Culture: NO GROWTH
Special Requests: ADEQUATE

## 2019-08-26 ENCOUNTER — Ambulatory Visit (INDEPENDENT_AMBULATORY_CARE_PROVIDER_SITE_OTHER): Payer: BC Managed Care – PPO | Admitting: Gastroenterology

## 2019-08-26 ENCOUNTER — Other Ambulatory Visit: Payer: Self-pay

## 2019-08-26 ENCOUNTER — Encounter: Payer: Self-pay | Admitting: Gastroenterology

## 2019-08-26 VITALS — BP 128/72 | HR 97 | Temp 99.0°F | Ht 62.0 in | Wt 214.6 lb

## 2019-08-26 DIAGNOSIS — B373 Candidiasis of vulva and vagina: Secondary | ICD-10-CM | POA: Diagnosis not present

## 2019-08-26 DIAGNOSIS — B3731 Acute candidiasis of vulva and vagina: Secondary | ICD-10-CM

## 2019-08-26 DIAGNOSIS — A0472 Enterocolitis due to Clostridium difficile, not specified as recurrent: Secondary | ICD-10-CM | POA: Diagnosis not present

## 2019-08-26 MED ORDER — FLUCONAZOLE 150 MG PO TABS: ORAL_TABLET | ORAL | 0 refills | Status: DC

## 2019-08-26 NOTE — Progress Notes (Signed)
08/26/2019 Nancy Reeves [id number] 22-Nov-1980   HISTORY OF PRESENT ILLNESS: This is a pleasant 38 year old female who was seen in consult by Dr. Leone Payor during recent hospitalization for C. difficile colitis.  Was discharged from the hospital on September 30.  Is here for hospital follow-up.  This is her first episode of C. difficile colitis.  Was discharged on vancomycin 500 mg 4 times daily.  She is still taking that medication and says that she has 1 week left.  She is feeling better, improving each day even from her hospital discharge.  Says that her energy level is the biggest issue.  Gets fatigued and weak feeling easily.  Still has some diarrhea, 4-5 times but per day, but smaller volume.  Still seeing blood on the toilet paper, but says that she knows that is from her fissure that she was supposed to have surgery for prior to getting sick with C. difficile.  Has some left lower quadrant abdominal discomfort.  Is asking for work note.  Is also asking for Diflucan for vaginal yeast infection related to taking antibiotics.   Past Medical History:  Diagnosis Date  . Fibromyalgia   . GERD (gastroesophageal reflux disease)   . Migraine    psuedotumor cerebra  . Pseudotumor cerebri    Past Surgical History:  Procedure Laterality Date  . LUMBAR PUNCTURE      reports that she has never smoked. She has never used smokeless tobacco. She reports previous alcohol use. She reports that she does not use drugs. family history is not on file. Allergies  Allergen Reactions  . Triptans Anaphylaxis      Outpatient Encounter Medications as of 08/26/2019  Medication Sig  . albuterol (VENTOLIN HFA) 108 (90 Base) MCG/ACT inhaler Inhale 1 puff into the lungs every 4 (four) hours.  Marland Kitchen dicyclomine (BENTYL) 10 MG capsule Take 1 capsule (10 mg total) by mouth 3 (three) times daily before meals.  . famotidine (PEPCID) 40 MG tablet Take 40 mg by mouth daily.   . fluconazole (DIFLUCAN) 100 MG tablet  Take 100 mg by mouth daily.   . promethazine (PHENERGAN) 12.5 MG tablet Take 1 tablet (12.5 mg total) by mouth every 8 (eight) hours as needed for nausea or vomiting.  . saccharomyces boulardii (FLORASTOR) 250 MG capsule Take 1 capsule (250 mg total) by mouth 2 (two) times daily.  Marland Kitchen tiZANidine (ZANAFLEX) 4 MG capsule Take 4 mg by mouth daily.   . vancomycin (VANCOCIN) 250 MG capsule Take 2 capsules (500 mg total) by mouth 4 (four) times daily.  . medroxyPROGESTERone (PROVERA) 10 MG tablet Take 10 mg by mouth daily.   . metFORMIN (GLUCOPHAGE-XR) 500 MG 24 hr tablet Take 500 mg by mouth at bedtime.  . [DISCONTINUED] DULoxetine (CYMBALTA) 60 MG capsule Take 60 mg by mouth daily.  . [DISCONTINUED] gabapentin (NEURONTIN) 300 MG capsule Take 300 mg by mouth 3 (three) times daily.   No facility-administered encounter medications on file as of 08/26/2019.      REVIEW OF SYSTEMS  : All other systems reviewed and negative except where noted in the History of Present Illness.   PHYSICAL EXAM: BP 128/72 (BP Location: Right Arm, Patient Position: Sitting, Cuff Size: Normal)   Pulse 97   Temp 99 F (37.2 C) (Oral)   Ht 5\' 2"  (1.575 m)   Wt 214 lb 9 oz (97.3 kg)   BMI 39.24 kg/m  General: Well developed black female in no acute distress Head: Normocephalic and atraumatic  Eyes:  Sclerae anicteric, conjunctiva pink. Ears: Normal auditory acuity Lungs: Clear throughout to auscultation; no increased WOB. Heart: Regular rate and rhythm; no M/R/G. Abdomen: Soft, non-distended.  BS present.  Mild LLQ TTP. Musculoskeletal: Symmetrical with no gross deformities  Skin: No lesions on visible extremities Extremities: No edema  Neurological: Alert oriented x 4, grossly non-focal Psychological:  Alert and cooperative. Normal mood and affect  ASSESSMENT AND PLAN: *Cdiff colitis with sepsis/SIRS, hospitalized:  Doing well.  Still on vancomycin 500 mg 4 times daily.  The plan was for total of 2-week course,  but when she was discharged from the hospital she had already had a week of antibiotics as inpatient and was discharged with 2 more full weeks of antibiotics/vancomycin.  She can just complete the vancomycin that she has and will receive about 3 weeks total.  She is taking 500 mg and studies show that there is no benefit over 125 mg, but since she is already on this dose we will just leave it the same.  After completing her antibiotics have asked her to begin taking Florastor twice daily.  We will give her a work note for her to return on Wednesday, October 14.  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  She is certainly to call us back, however, if symptoms worsen. *Vaginal yeast infection:  Will treat with diflucan 150 mg and then can repeat the dose in one week.  Prescription sent.   *Rectal bleeding:  Sees bright red blood on the toilet paper.  Says that she had an appt with surgery, but got sick with Cdiff and was in the hospital at the time of her appt.  She plans to follow-up with them once her Cdiff issues resolves.   CC:  No ref. provider found

## 2019-08-26 NOTE — Patient Instructions (Signed)
Take Florastor twice a day when vancomycin is completed  We have given you a work note today  If you are age 39 or older, your body mass index should be between 23-30. Your Body mass index is 39.24 kg/m. If this is out of the aforementioned range listed, please consider follow up with your Primary Care Provider.  If you are age 50 or younger, your body mass index should be between 19-25. Your Body mass index is 39.24 kg/m. If this is out of the aformentioned range listed, please consider follow up with your Primary Care Provider.

## 2019-08-27 ENCOUNTER — Encounter: Payer: Self-pay | Admitting: Gastroenterology

## 2019-08-27 DIAGNOSIS — B3731 Acute candidiasis of vulva and vagina: Secondary | ICD-10-CM | POA: Insufficient documentation

## 2019-08-27 DIAGNOSIS — B373 Candidiasis of vulva and vagina: Secondary | ICD-10-CM | POA: Insufficient documentation

## 2019-08-30 ENCOUNTER — Telehealth: Payer: Self-pay | Admitting: Gastroenterology

## 2019-08-30 NOTE — Telephone Encounter (Signed)
The pt has been advised that her prescription was sent to the correct pharmacy.  She states she will call and make sure it is available

## 2019-09-03 ENCOUNTER — Telehealth: Payer: Self-pay | Admitting: Gastroenterology

## 2019-09-03 NOTE — Telephone Encounter (Signed)
Pt inquired about paperwork that she had dropped off for her job

## 2019-09-06 NOTE — Telephone Encounter (Signed)
Pt called back regarding paperwork.  Please return her call.

## 2019-09-07 ENCOUNTER — Telehealth: Payer: Self-pay | Admitting: Gastroenterology

## 2019-09-07 NOTE — Telephone Encounter (Signed)
Dropped paperwork off and is asking to pick it up  on Thurday. Said that it was accommodation of release(about her condition and the effects) She also stated while I was getting information to her that the phone was ringing from this number so this might be getting handled.

## 2019-09-07 NOTE — Telephone Encounter (Signed)
See phone note dated 09/07/2019

## 2019-09-07 NOTE — Telephone Encounter (Signed)
Spoke with patient and told her the paperwork has been sent to [facility] to be processed.  Pamala Hurry is calling to check on its status and then I will call patient back.  Patient agreed.

## 2019-09-09 ENCOUNTER — Telehealth: Payer: Self-pay

## 2019-09-09 NOTE — Telephone Encounter (Signed)
Patient called upset that the paperwork that she requested to be dropped off at her appt on 08/30/19 has not been completed.  I contacted [employer] and they have no record of the paperwork that was sent interoffice mail on 08/30/19.  I apologized to the patient for the delay.  She reports that she will be fired tomorrow without the accomodation.  I emailed her a letter to provide to her supervisor attempting to explain the situation.  I have provided the patient the phone number to have her supervisor call me if I can be of any further help with the delay.  The patient is asked to hand carry the paperwork to [employer] at [place] and provided her the phone number [phone].  She thanked me for the help.  She went on to inquire if the letter would provide her additional medical appointment accommodations for her upcoming surgery.  She is advised that the letter would not states that and that she should communicate with the surgeons office to have the paperwork filled out prior to her surgery. Alonza Bogus, PA notified of the above and agrees to the accommodations of additional bathroom breaks.

## 2019-09-10 NOTE — Telephone Encounter (Signed)
Nancy Reeves typed a document to replace the paperwork that has not been processed for patient to present to her employer

## 2019-09-14 ENCOUNTER — Encounter: Payer: Self-pay | Admitting: *Deleted

## 2019-10-31 ENCOUNTER — Encounter (HOSPITAL_BASED_OUTPATIENT_CLINIC_OR_DEPARTMENT_OTHER): Payer: Self-pay | Admitting: Emergency Medicine

## 2019-10-31 ENCOUNTER — Emergency Department (HOSPITAL_BASED_OUTPATIENT_CLINIC_OR_DEPARTMENT_OTHER)
Admission: EM | Admit: 2019-10-31 | Discharge: 2019-10-31 | Disposition: A | Discharge status: Home / Self Care | Payer: BC Managed Care – PPO | Attending: Emergency Medicine | Admitting: Emergency Medicine

## 2019-10-31 ENCOUNTER — Other Ambulatory Visit: Payer: Self-pay

## 2019-10-31 DIAGNOSIS — Z7984 Long term (current) use of oral hypoglycemic drugs: Secondary | ICD-10-CM | POA: Diagnosis not present

## 2019-10-31 DIAGNOSIS — M797 Fibromyalgia: Secondary | ICD-10-CM | POA: Insufficient documentation

## 2019-10-31 DIAGNOSIS — M542 Cervicalgia: Secondary | ICD-10-CM

## 2019-10-31 DIAGNOSIS — Z888 Allergy status to other drugs, medicaments and biological substances status: Secondary | ICD-10-CM | POA: Insufficient documentation

## 2019-10-31 DIAGNOSIS — Z79899 Other long term (current) drug therapy: Secondary | ICD-10-CM | POA: Insufficient documentation

## 2019-10-31 MED ORDER — METHOCARBAMOL 500 MG PO TABS: 500.0000 mg | ORAL_TABLET | Freq: Two times a day (BID) | ORAL | 0 refills | Status: DC

## 2019-10-31 NOTE — ED Provider Notes (Signed)
[name] Provider Note   CSN: [id number] Arrival date & time: 10/31/19  1214     History Chief Complaint  Patient presents with  . Neck Pain    Nancy Reeves is a 38 y.o. female.  HPI  Presents with 2 days of neck pain has been constant, waxing and waning worse with movement.  States that pain began when she woke up with sore neck feels like she slept on it funny.  Patient states that she had no trauma, no fever, no numbness weakness or tingling, no urinary incontinence or retention, no difficulty walking, has full range of motion of her neck denies any history of cancer, is on no blood thinners, states that she has had neck pain like this before.  She states that she is on tizanidine for her fibromyalgia and she took this last night with some relief.  Has used naproxen but no Tylenol.  Patient denies any chest pain, difficulty breathing low back pain urinary symptoms or abdominal pain.  Patient states that she had x-rays of her entire spine taken last time she had similar symptoms with no abnormalities found.     Past Medical History:  Diagnosis Date  . Fibromyalgia   . GERD (gastroesophageal reflux disease)   . Migraine    psuedotumor cerebra  . Pseudotumor cerebri     Patient Active Problem List   Diagnosis Date Noted  . Vaginal yeast infection 08/27/2019  . C. difficile colitis   . C. Diff Colitis 08/12/2019  . Hyponatremia 08/12/2019  . Leucocytosis 08/12/2019    Past Surgical History:  Procedure Laterality Date  . LUMBAR PUNCTURE       OB History   No obstetric history on file.     History reviewed. No pertinent family history.  Social History   Tobacco Use  . Smoking status: Never Smoker  . Smokeless tobacco: Never Used  Substance Use Topics  . Alcohol use: Not Currently    Comment: occasional  . Drug use: No    Home Medications Prior to Admission medications   Medication Sig Start Date End Date Taking?  Authorizing Provider  albuterol (VENTOLIN HFA) 108 (90 Base) MCG/ACT inhaler Inhale 1 puff into the lungs every 4 (four) hours. 04/22/19   [provider]  dicyclomine (BENTYL) 10 MG capsule Take 1 capsule (10 mg total) by mouth 3 (three) times daily before meals. 08/18/19   Mercy Riding, MD  famotidine (PEPCID) 40 MG tablet Take 40 mg by mouth daily.     [provider]  fluconazole (DIFLUCAN) 150 MG tablet Take one pill today and one tablet in 1 week 08/26/19   Zehr, Laban Emperor, PA-C  medroxyPROGESTERone (PROVERA) 10 MG tablet Take 10 mg by mouth daily.  07/08/19   [provider]  metFORMIN (GLUCOPHAGE-XR) 500 MG 24 hr tablet Take 500 mg by mouth at bedtime. 07/08/19   [provider]  methocarbamol (ROBAXIN) 500 MG tablet Take 1 tablet (500 mg total) by mouth 2 (two) times daily. 10/31/19   Tedd Sias, PA  promethazine (PHENERGAN) 12.5 MG tablet Take 1 tablet (12.5 mg total) by mouth every 8 (eight) hours as needed for nausea or vomiting. 08/18/19   Mercy Riding, MD  saccharomyces boulardii (FLORASTOR) 250 MG capsule Take 1 capsule (250 mg total) by mouth 2 (two) times daily. 08/18/19   Mercy Riding, MD  vancomycin (VANCOCIN) 250 MG capsule Take 2 capsules (500 mg total) by mouth 4 (four)  times daily. 08/18/19   Almon HerculesGonfa, Taye T, MD  DULoxetine (CYMBALTA) 60 MG capsule Take 60 mg by mouth daily.  07/19/19  [provider]  gabapentin (NEURONTIN) 300 MG capsule Take 300 mg by mouth 3 (three) times daily.  07/19/19  [provider]    Allergies    Triptans  Review of Systems   Review of Systems  Constitutional: Negative for activity change, chills and fever.  HENT: Negative for congestion.   Respiratory: Negative for shortness of breath.   Cardiovascular: Negative for chest pain.  Gastrointestinal: Negative for abdominal distention.  Musculoskeletal:       Neck pain  Neurological: Negative for dizziness and headaches.    Physical Exam  Updated Vital Signs BP 123/84   Pulse 93   Temp 98.5 F (36.9 C) (Oral)   Resp 18   Ht 5\' 3"  (1.6 m)   Wt 93 kg   LMP 05/08/2019   SpO2 100%   BMI 36.31 kg/m   Physical Exam Vitals and nursing note reviewed.  Constitutional:      General: She is not in acute distress. HENT:     Head: Normocephalic and atraumatic.     Nose: Nose normal.  Eyes:     General: No scleral icterus. Cardiovascular:     Pulses: Normal pulses.     Comments: Pulses in all 4 extremities equal and symmetric 3+ Abdominal:     Palpations: Abdomen is soft.     Tenderness: There is no abdominal tenderness.  Musculoskeletal:     Cervical back: Normal range of motion.     Right lower leg: No edema.     Left lower leg: No edema.     Comments: Patient has full range of motion of neck.  Tenderness to palpation of paracervical and parathoracic muscles with severe muscular spasm.  Mild midline cervical and thoracic tenderness as well with no pain with percussion of vertebral.  No step-off deformity or bruising.  Skin:    General: Skin is warm and dry.     Capillary Refill: Capillary refill takes less than 2 seconds.  Neurological:     Mental Status: She is alert. Mental status is at baseline.     Comments: Patient strength 5/5 in upper and lower extremities.  Sensation intact all 4 extremities.  Patient is ambulatory with no gait abnormalities.  Psychiatric:        Mood and Affect: Mood normal.        Behavior: Behavior normal.     ED Results / Procedures / Treatments   Labs (all labs ordered are listed, but only abnormal results are displayed) Labs Reviewed - No data to display  EKG None  Radiology No results found.  Procedures Procedures (including critical care time)  Medications Ordered in ED Medications - No data to display  ED Course  I have reviewed the triage vital signs and the nursing notes.  Pertinent labs & imaging results that were available during my care of the patient were  reviewed by me and considered in my medical decision making (see chart for details).    MDM Rules/Calculators/A&P    Patient is 38 year old female presented today for neck pain.  Patient has had no trauma, fevers, urinary symptoms or neurologic deficiencies indicative of a neurologic, spinal, brain etiology.  Patient has a history of muscular pain.  Has history of fibromyalgia.  Cauda equina, fracture, spinal epidural abscess, spondylolysis, spinal stenosis, spinal hematoma, AAA, aortic dissection, pancreatitis, gallbladder issue or other  known musculoskeletal cause of back pain.  Patient has no neurological deficiencies and is ambulatory with a large motion of neck had shared decision-making was asked with patient about letter to conduct CT scans of neck and thorax.  She is currently trying to get pregnant and declines CT imaging.  I recommended that she use conservative therapy and follow-up with orthopedics if she continues to have symptoms.  Patient given strict return precautions to include any symptoms of cauda equina.     This patient appears reasonably screened and I doubt any other medical condition requiring further workup, evaluation, or treatment in the ED at this time prior to discharge.   Patient's vitals are WNL apart from vital sign abnormalities discussed above, patient is in NAD, and able to ambulate in the ED at their baseline. Pain has been managed or a plan has been made for home management and has no complaints prior to discharge. Patient is comfortable with above plan and is stable for discharge at this time. All questions were answered prior to disposition. Results from the ER workup discussed with the patient face to face and all questions answered to the best of my ability. The patient is safe for discharge with strict return precautions. Patient appears safe for discharge with appropriate follow-up. Conveyed my impression with the patient and they voiced understanding and  are agreeable to plan.   An After Visit Summary was printed and given to the patient.  Portions of this note were generated with Scientist, clinical (histocompatibility and immunogenetics). Dictation errors may occur despite best attempts at proofreading.    Final Clinical Impression(s) / ED Diagnoses Final diagnoses:  Neck pain    Rx / DC Orders ED Discharge Orders         Ordered    methocarbamol (ROBAXIN) 500 MG tablet  2 times daily     10/31/19 1359           Solon Augusta [place], [place] 10/31/19 1400    Cathren Laine, MD 11/01/19 1335

## 2019-10-31 NOTE — Discharge Instructions (Addendum)
Please use Robaxin as I have prescribed.  You may use this instead of tizanidine please not use this medication at the same time. Use heat therapy.  Gentle stretching and Tylenol and ibuprofen for pain.  Please use Tylenol or ibuprofen for pain.  You may use 600 mg ibuprofen every 6 hours or 1000 mg of Tylenol every 6 hours.  You may choose to alternate between the 2.  This would be most effective.  Not to exceed 4 g of Tylenol within 24 hours.  Not to exceed 3200 mg ibuprofen 24 hours.  You may follow-up with [facility] information included if you did not have symptoms.

## 2019-10-31 NOTE — ED Triage Notes (Signed)
Pt c/o posterior neck pain with radiation down back x 2 days. Pt reports pain when waking yesterday. Pt denies injury. Pt denies numbness or tingling

## 2019-12-21 IMAGING — CT CT ABD-PELV W/ CM
2 of 4 series · 15 of 46 positions shown, 17 images · IV contrast (APPLIED)
Comparison: CT Abdomen and Pelvis 08/12/2019.

CLINICAL DATA: 37-year-old female with sepsis, C difficile colitis.
Persistent fever.

EXAM:
CT ABDOMEN AND PELVIS WITH CONTRAST
TECHNIQUE: Multidetector CT imaging of the abdomen and pelvis was performed
using the standard protocol following bolus administration of
intravenous contrast.
CONTRAST:  30mL OMNIPAQUE IOHEXOL 300 MG/ML SOLN, 100mL OMNIPAQUE
IOHEXOL 300 MG/ML SOLN

[Series 2: axial st · axial · 0.96mm/px · z∈[-442,-27]mm · 12 of 95 slices shown, 14 images]
[im 6/95  soft-tissue]
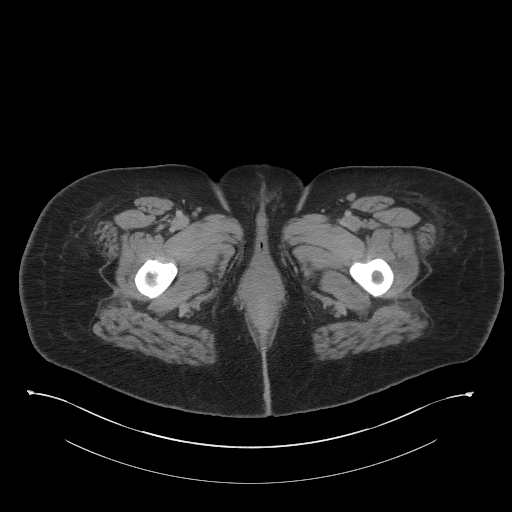
[im 6/95  bone]
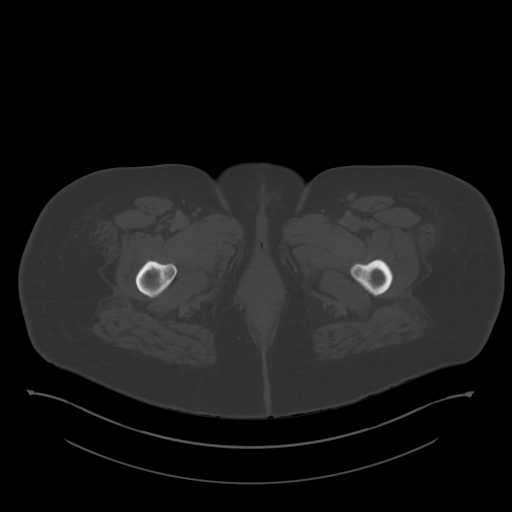
[im 16/95  soft-tissue]
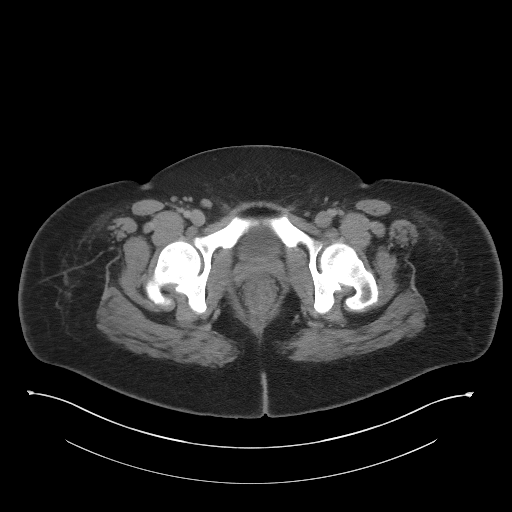
[im 21/95  soft-tissue]
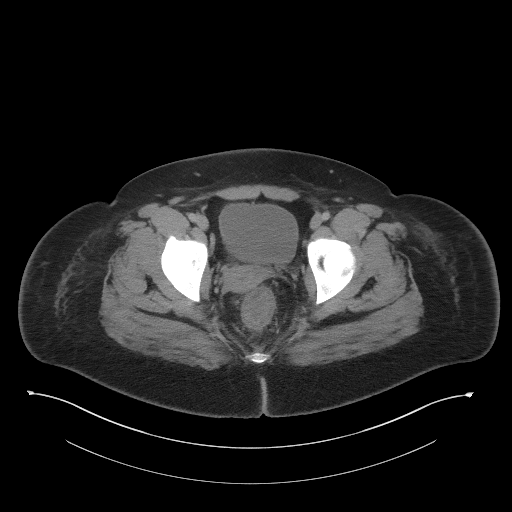
[im 27/95  soft-tissue]
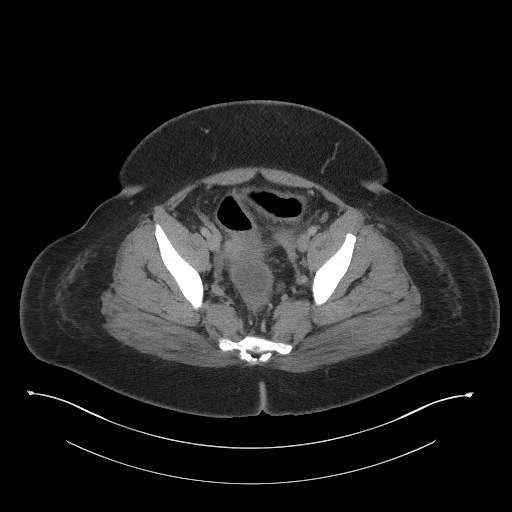
[im 37/95  soft-tissue]
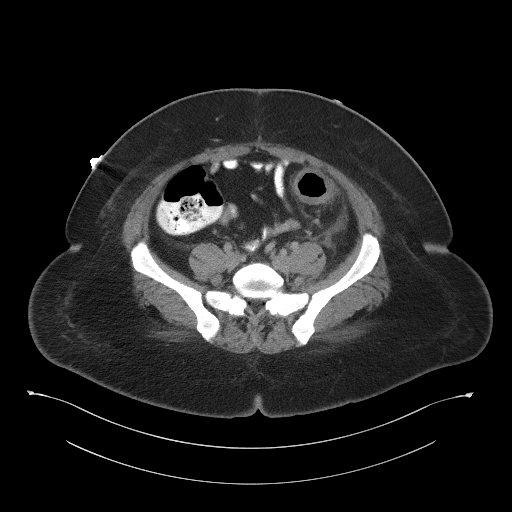
[im 42/95  soft-tissue]
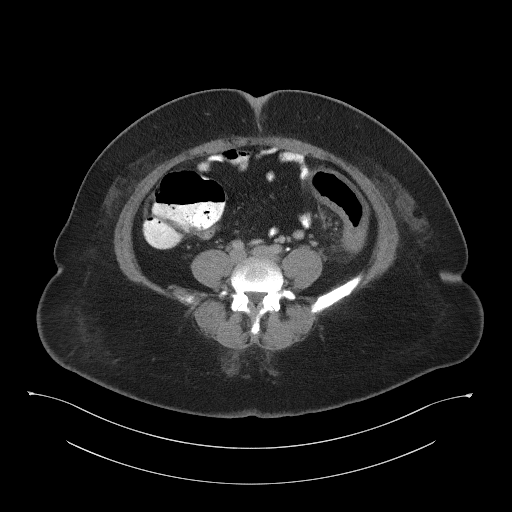
[im 53/95  soft-tissue]
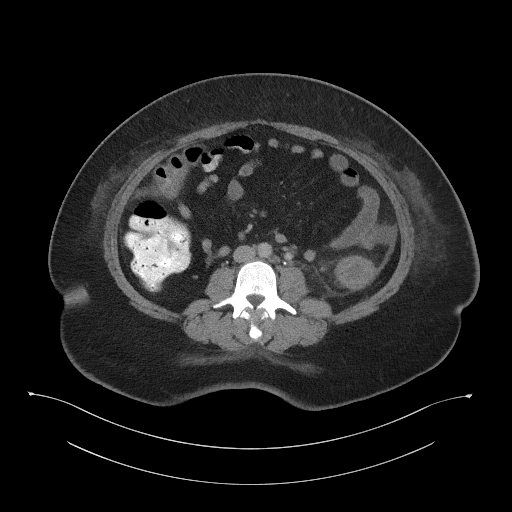
[im 58/95  soft-tissue]
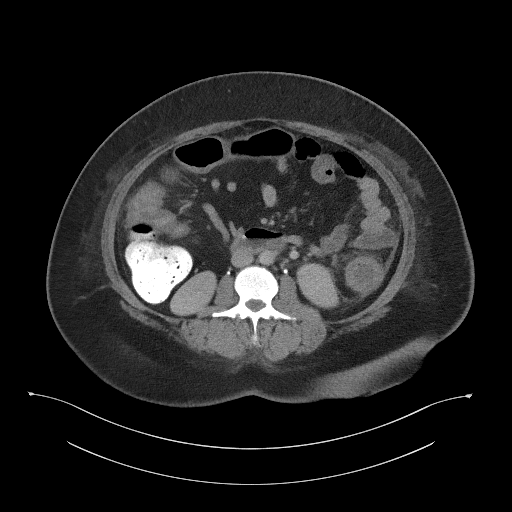
[im 68/95  soft-tissue]
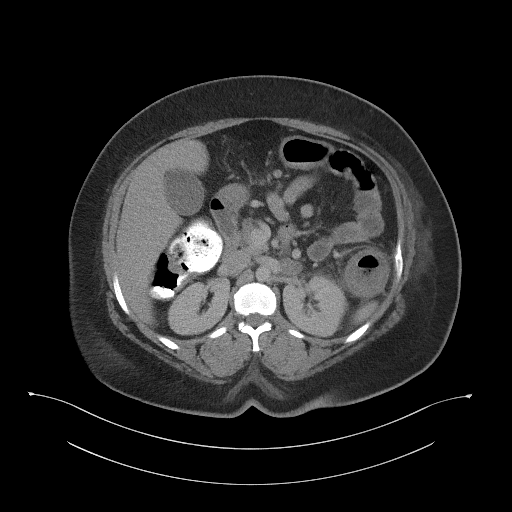
[im 68/95  bone]
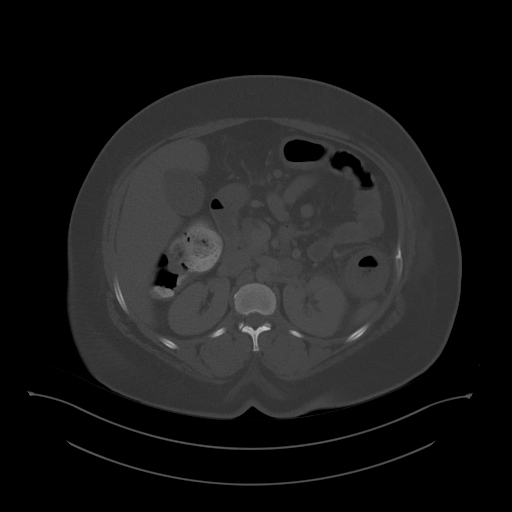
[im 74/95  soft-tissue]
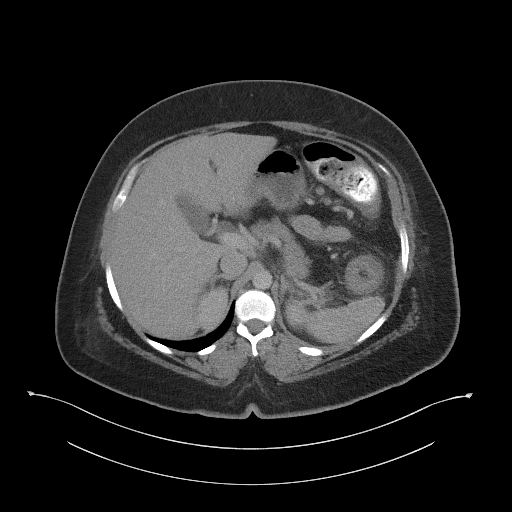
[im 79/95  soft-tissue]
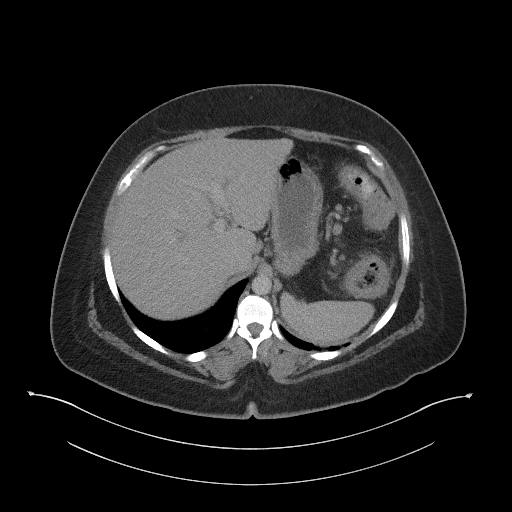
[im 89/95  soft-tissue]
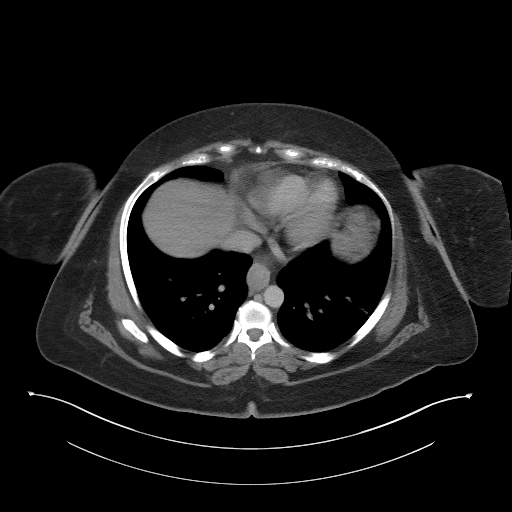

[Series 5: coronal st · coronal · 0.79mm/px · 3 of 110 slices shown]
[im 37/110  soft-tissue]
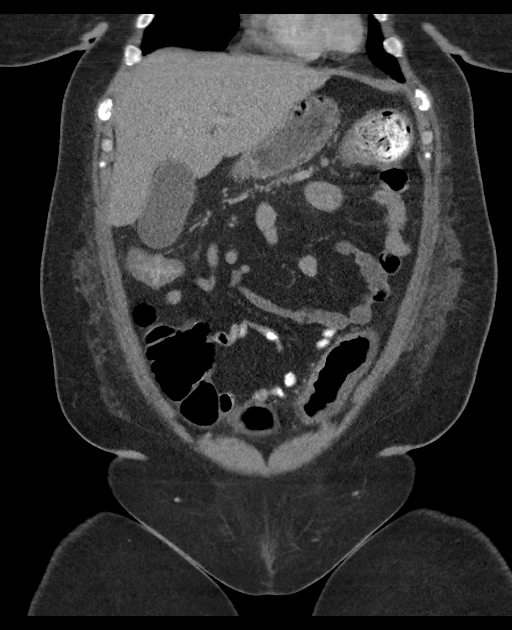
[im 49/110  soft-tissue]
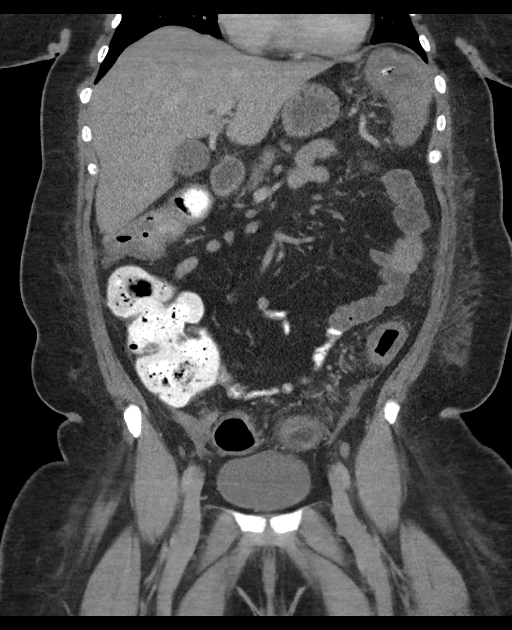
[im 61/110  soft-tissue]
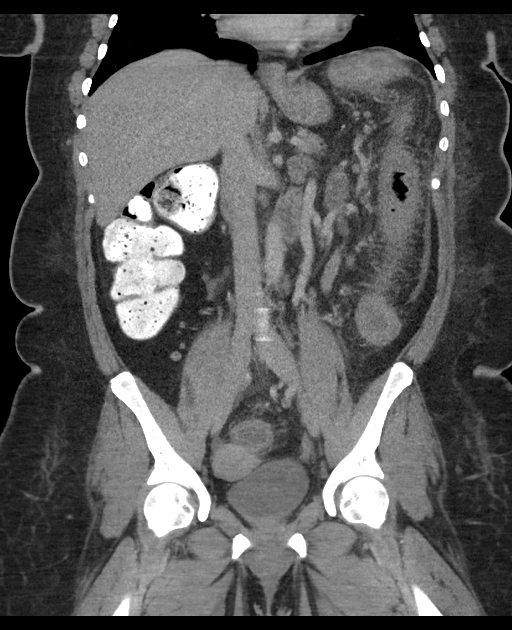

[15 of 46 positions shown; findings below may reference images not displayed]

FINDINGS: Lower chest: Increased left lower lobe atelectasis. Otherwise stable
lung bases. No pericardial or pleural effusion.

Hepatobiliary: Liver and gallbladder are within normal limits.

Pancreas: Negative.

Spleen: Negative.

Adrenals/Urinary Tract: Normal adrenal glands. Renal enhancement and
contrast excretion appears symmetric with normal proximal ureters.
Diminutive and unremarkable urinary bladder.

Stomach/Bowel: Generalized large bowel wall thickening and edema
from the rectum to the hepatic flexure. Associated large bowel
mesenteric stranding. Involvement appears most severe from the
splenic flexure distally. The cecum and ascending colon are
relatively spared and contain oral contrast. The terminal ileum is
decompressed and appears mildly thickened also on series 2, image
61. There is trace free fluid in the right lower quadrant abutting
the cecum on coronal image 45. The appendix is indistinct throughout
its course on coronal image 53, although does not appear to be a
separate epicenter of inflammation. There is no gas or contrast
within the appendix.

There is no dilated small bowel. There is a small volume of free
fluid along the jejunum contiguous with a small volume of fluid in
the left pericolic gutter on series 2, image 40. Largely
decompressed stomach. Questionable small gastric hiatal hernia. No
free air.

Vascular/Lymphatic: Suboptimal intravascular contrast bolus but the
major arterial structures appear to remain patent. The portal venous
system appears to be patent. No lymphadenopathy.

Reproductive: Negative.

Other: No pelvic free fluid. Mildly increased body wall edema
bilaterally.

Musculoskeletal: No acute osseous abnormality identified.
IMPRESSION: 1. Continued severe generalized colitis. This relatively spares the
cecum and ascending colon although secondary involvement of the
appendix and terminal ileum is suspected.
No bowel obstruction or perforation.
2. A small volume of free fluid appears to be reactive. No abscess
or drainable fluid collection at this time.
3. Mild lung base atelectasis.

## 2019-12-21 IMAGING — DX DG CHEST 1V PORT
1 series · 1 of 1 positions shown · non-contrast
Comparison: Radiograph 12/03/2018

CLINICAL DATA: Fever

EXAM:
PORTABLE CHEST 1 VIEW

[chest ap]
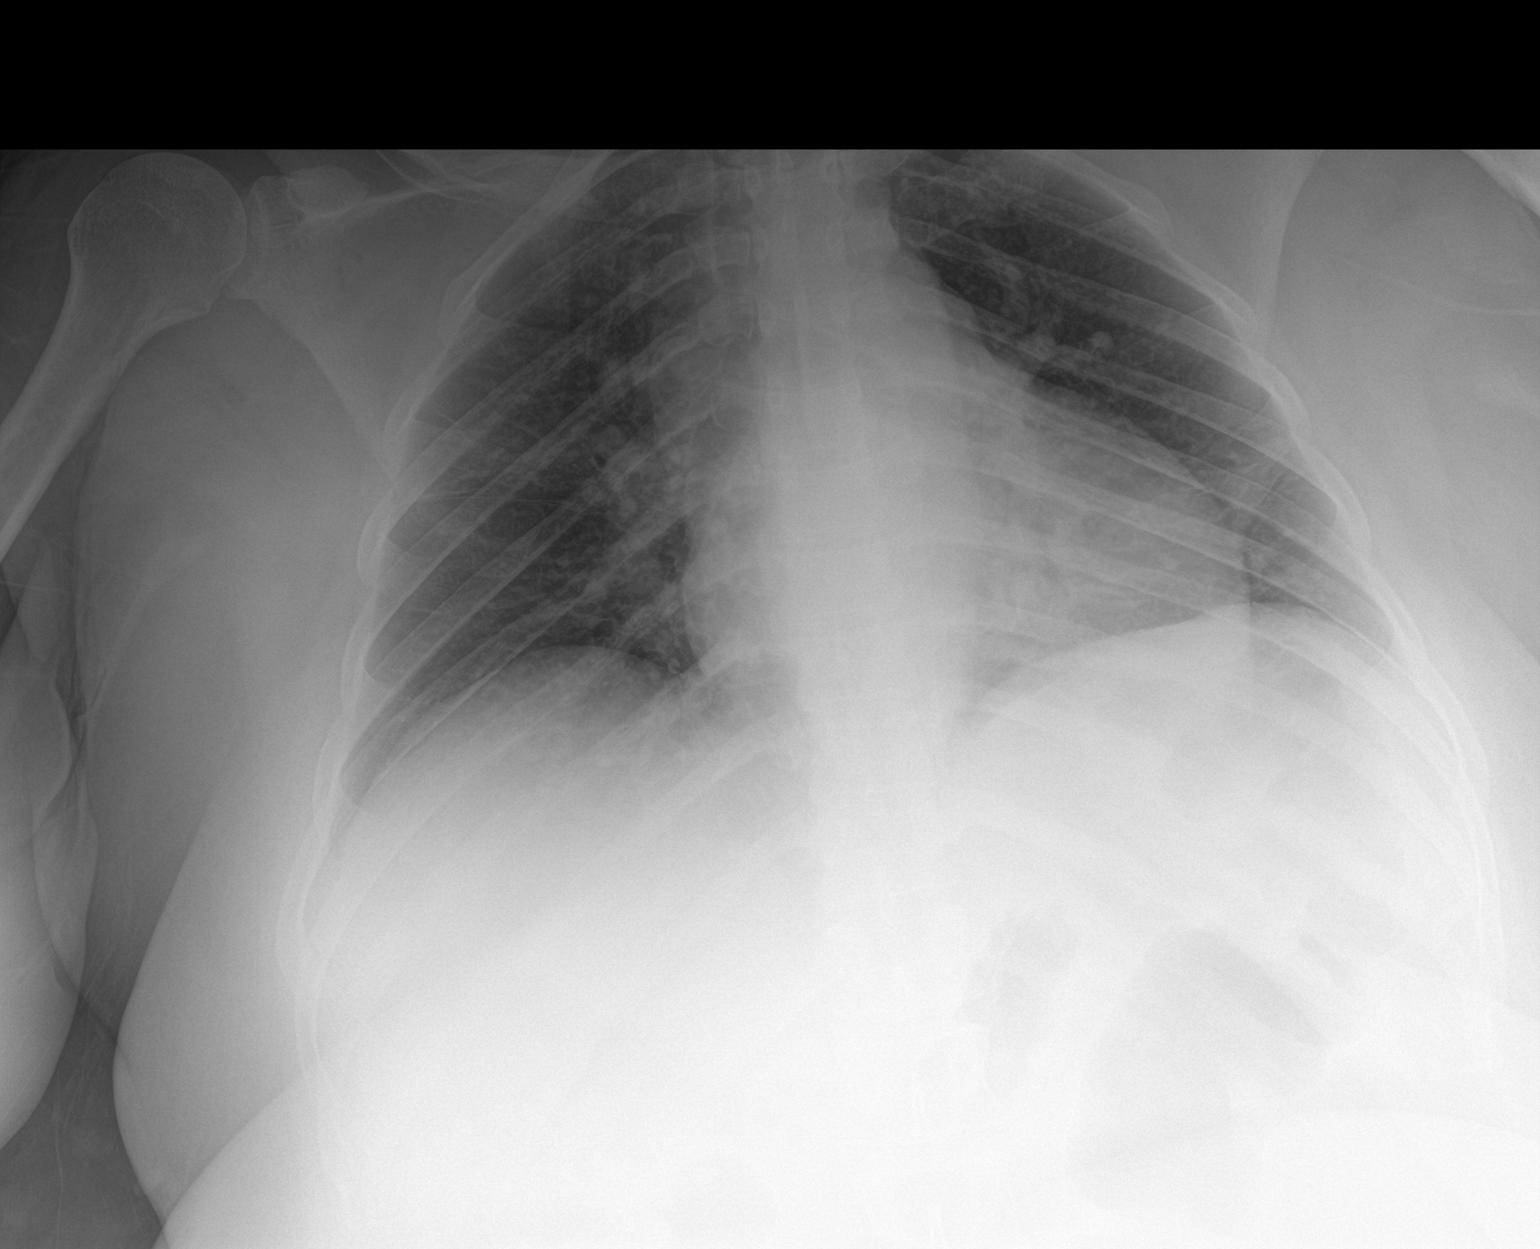

[1 of 1 positions shown; findings below may reference images not displayed]

FINDINGS: Lung volumes are low with some hazy basilar areas of opacity and
questionable air bronchograms in the left infrahilar lung. No
pneumothorax or effusion. Cardiac size is at the upper limits of
normal though this may be accentuated by the portable technique. No
acute osseous or soft tissue abnormality.
IMPRESSION: Basilar areas of opacity and air bronchograms in the left infrahilar
lung, suspicious for pneumonia.

## 2020-05-15 DIAGNOSIS — R202 Paresthesia of skin: Secondary | ICD-10-CM | POA: Insufficient documentation

## 2020-06-05 ENCOUNTER — Emergency Department (HOSPITAL_BASED_OUTPATIENT_CLINIC_OR_DEPARTMENT_OTHER): Payer: BC Managed Care – PPO

## 2020-06-05 ENCOUNTER — Encounter (HOSPITAL_BASED_OUTPATIENT_CLINIC_OR_DEPARTMENT_OTHER): Payer: Self-pay | Admitting: *Deleted

## 2020-06-05 ENCOUNTER — Other Ambulatory Visit: Payer: Self-pay

## 2020-06-05 DIAGNOSIS — R509 Fever, unspecified: Secondary | ICD-10-CM | POA: Diagnosis not present

## 2020-06-05 DIAGNOSIS — R05 Cough: Secondary | ICD-10-CM | POA: Diagnosis not present

## 2020-06-05 DIAGNOSIS — J069 Acute upper respiratory infection, unspecified: Secondary | ICD-10-CM | POA: Diagnosis not present

## 2020-06-05 DIAGNOSIS — Z20822 Contact with and (suspected) exposure to covid-19: Secondary | ICD-10-CM | POA: Insufficient documentation

## 2020-06-05 DIAGNOSIS — R0602 Shortness of breath: Secondary | ICD-10-CM | POA: Diagnosis not present

## 2020-06-05 DIAGNOSIS — B9789 Other viral agents as the cause of diseases classified elsewhere: Secondary | ICD-10-CM | POA: Diagnosis not present

## 2020-06-05 LAB — SARS CORONAVIRUS 2 BY RT PCR (HOSPITAL ORDER, PERFORMED IN ~~LOC~~ HOSPITAL LAB): SARS Coronavirus 2: NEGATIVE

## 2020-06-05 NOTE — ED Triage Notes (Signed)
Pt c/o URi symptoms x 3 days  

## 2020-06-06 ENCOUNTER — Emergency Department (HOSPITAL_BASED_OUTPATIENT_CLINIC_OR_DEPARTMENT_OTHER)
Admission: EM | Admit: 2020-06-06 | Discharge: 2020-06-06 | Disposition: A | Discharge status: Home / Self Care | Payer: BC Managed Care – PPO | Attending: Emergency Medicine | Admitting: Emergency Medicine

## 2020-06-06 DIAGNOSIS — J069 Acute upper respiratory infection, unspecified: Secondary | ICD-10-CM

## 2020-06-06 MED ORDER — PREDNISONE 50 MG PO TABS
60.0000 mg | ORAL_TABLET | Freq: Once | ORAL | Status: AC
  Administered 2020-06-06: 60 mg via ORAL
  Filled 2020-06-06: qty 1

## 2020-06-06 MED ORDER — PREDNISONE 50 MG PO TABS: 50.0000 mg | ORAL_TABLET | Freq: Every day | ORAL | 0 refills | Status: DC

## 2020-06-06 MED ORDER — ALBUTEROL SULFATE HFA 108 (90 BASE) MCG/ACT IN AERS: 1.0000 | INHALATION_SPRAY | RESPIRATORY_TRACT | 0 refills | Status: AC

## 2020-06-06 NOTE — ED Provider Notes (Signed)
[facility] EMERGENCY DEPARTMENT Provider Note   CSN: [id number] Arrival date & time: 06/05/20  2117   History Chief Complaint  Patient presents with  . URI    Nancy Reeves is a 39 y.o. female.  The history is provided by the patient.  URI She has history of pseudotumor cerebri, and comes in with flulike illness for the last 4 days.  She has had nonproductive cough, sore throat, clear rhinorrhea.  She has noted that when she lays down, she will cough and get short of breath.  She has been using an albuterol inhaler when she had from a prior illness and that seems to give temporary relief.  She has also noted some stinging sensation in the chest area.  There has been no nausea, vomiting, diarrhea.  She is not any change in her sense of smell or taste.  She did have a sick contact with a similar illness, but no known exposure to COVID-19.  She has not been vaccinated against COVID-19.  Past Medical History:  Diagnosis Date  . Fibromyalgia   . GERD (gastroesophageal reflux disease)   . Migraine    psuedotumor cerebra  . Pseudotumor cerebri     Patient Active Problem List   Diagnosis Date Noted  . Vaginal yeast infection 08/27/2019  . C. difficile colitis   . C. Diff Colitis 08/12/2019  . Hyponatremia 08/12/2019  . Leucocytosis 08/12/2019    Past Surgical History:  Procedure Laterality Date  . LUMBAR PUNCTURE       OB History   No obstetric history on file.     No family history on file.  Social History   Tobacco Use  . Smoking status: Never Smoker  . Smokeless tobacco: Never Used  Vaping Use  . Vaping Use: Never used  Substance Use Topics  . Alcohol use: Not Currently    Comment: occasional  . Drug use: No    Home Medications Prior to Admission medications   Medication Sig Start Date End Date Taking? Authorizing Provider  albuterol (VENTOLIN HFA) 108 (90 Base) MCG/ACT inhaler Inhale 1 puff into the lungs every 4 (four) hours. 04/22/19   [provider]  dicyclomine (BENTYL) 10 MG capsule Take 1 capsule (10 mg total) by mouth 3 (three) times daily before meals. 08/18/19   Almon Hercules, MD  famotidine (PEPCID) 40 MG tablet Take 40 mg by mouth daily.     [provider]  fluconazole (DIFLUCAN) 150 MG tablet Take one pill today and one tablet in 1 week 08/26/19   Zehr, Princella Pellegrini, PA-C  medroxyPROGESTERone (PROVERA) 10 MG tablet Take 10 mg by mouth daily.  07/08/19   [provider]  metFORMIN (GLUCOPHAGE-XR) 500 MG 24 hr tablet Take 500 mg by mouth at bedtime. 07/08/19   [provider]  methocarbamol (ROBAXIN) 500 MG tablet Take 1 tablet (500 mg total) by mouth 2 (two) times daily. 10/31/19   Gailen Shelter, PA  promethazine (PHENERGAN) 12.5 MG tablet Take 1 tablet (12.5 mg total) by mouth every 8 (eight) hours as needed for nausea or vomiting. 08/18/19   Almon Hercules, MD  saccharomyces boulardii (FLORASTOR) 250 MG capsule Take 1 capsule (250 mg total) by mouth 2 (two) times daily. 08/18/19   Almon Hercules, MD  vancomycin (VANCOCIN) 250 MG capsule Take 2 capsules (500 mg total) by mouth 4 (four) times daily. 08/18/19   Almon Hercules, MD  DULoxetine (CYMBALTA) 60 MG capsule Take 60 mg  by mouth daily.  07/19/19  [provider]  gabapentin (NEURONTIN) 300 MG capsule Take 300 mg by mouth 3 (three) times daily.  07/19/19  [provider]    Allergies    Triptans  Review of Systems   Review of Systems  All other systems reviewed and are negative.   Physical Exam Updated Vital Signs BP 105/80 (BP Location: Right Arm)   Pulse (!) 108   Temp 99 F (37.2 C) (Oral)   Resp 16   Ht 5\' 4"  (1.626 m)   Wt 97.5 kg   SpO2 100%   BMI 36.90 kg/m   Physical Exam Vitals and nursing note reviewed.   38 year old female, resting comfortably and in no acute distress. Vital signs are significant for slightly elevated heart rate. Oxygen saturation is 100%, which is normal. Head is normocephalic and  atraumatic. PERRLA, EOMI. Oropharynx is clear. Neck is nontender and supple without adenopathy or JVD. Back is nontender and there is no CVA tenderness. Lungs are clear without rales, wheezes, or rhonchi. Chest is nontender. Heart has regular rate and rhythm without murmur. Abdomen is soft, flat, nontender without masses or hepatosplenomegaly and peristalsis is normoactive. Extremities have no cyanosis or edema, full range of motion is present. Skin is warm and dry without rash. Neurologic: Mental status is normal, cranial nerves are intact, there are no motor or sensory deficits.  ED Results / Procedures / Treatments   Labs (all labs ordered are listed, but only abnormal results are displayed) Labs Reviewed  SARS CORONAVIRUS 2 BY RT PCR (HOSPITAL ORDER, PERFORMED IN [facility])    EKG None  Radiology DG Chest Portable 1 View  Result Date: 06/05/2020 CLINICAL DATA:  Cough, fever and shortness of breath x3 days. EXAM: PORTABLE CHEST 1 VIEW COMPARISON:  August 15, 2019 FINDINGS: There is no evidence of acute infiltrate, pleural effusion or pneumothorax. The heart size and mediastinal contours are within normal limits. The visualized skeletal structures are unremarkable. IMPRESSION: No active disease. Electronically Signed   By: August 17, 2019 M.D.   On: 06/05/2020 21:55    Procedures Procedures   Medications Ordered in ED Medications  predniSONE (DELTASONE) tablet 60 mg (has no administration in time range)    ED Course  I have reviewed the triage vital signs and the nursing notes.  Pertinent labs & imaging results that were available during my care of the patient were reviewed by me and considered in my medical decision making (see chart for details).  MDM Rules/Calculators/A&P Viral upper respiratory infection.  COVID-19 PCR test is negative.  Chest x-ray is negative for pneumonia.  Based on response to albuterol, she probably is having some bronchospasm  at home.  She is given a prescription for prednisone and is given a refill of her prescription for albuterol inhaler.  Return precautions discussed.  Nancy Reeves was evaluated in Emergency Department on 06/06/2020 for the symptoms described in the history of present illness. She was evaluated in the context of the global COVID-19 pandemic, which necessitated consideration that the patient might be at risk for infection with the SARS-CoV-2 virus that causes COVID-19. Institutional protocols and algorithms that pertain to the evaluation of patients at risk for COVID-19 are in a state of rapid change based on information released by regulatory bodies including the CDC and federal and state organizations. These policies and algorithms were followed during the patient's care in the ED.   Final Clinical Impression(s) / ED Diagnoses Final diagnoses:  None    Rx / DC Orders ED Discharge Orders    None       Dione Booze, MD 06/06/20 [phone]

## 2020-06-06 NOTE — Discharge Instructions (Signed)
Continue using your inhaler as needed.  Drink plenty of fluids.  Take acetaminophen and/or ibuprofen as needed for fever or aching.  Once you have recovered from this infection, make sure to get the vaccination for COVID-19.

## 2020-07-18 DIAGNOSIS — D649 Anemia, unspecified: Secondary | ICD-10-CM | POA: Insufficient documentation

## 2020-07-18 DIAGNOSIS — Z01419 Encounter for gynecological examination (general) (routine) without abnormal findings: Secondary | ICD-10-CM | POA: Diagnosis not present

## 2020-07-18 DIAGNOSIS — N912 Amenorrhea, unspecified: Secondary | ICD-10-CM | POA: Diagnosis not present

## 2020-07-18 DIAGNOSIS — Z6841 Body Mass Index (BMI) 40.0 and over, adult: Secondary | ICD-10-CM | POA: Diagnosis not present

## 2020-07-19 DIAGNOSIS — R067 Sneezing: Secondary | ICD-10-CM | POA: Diagnosis not present

## 2020-07-19 DIAGNOSIS — R05 Cough: Secondary | ICD-10-CM | POA: Diagnosis not present

## 2020-07-19 DIAGNOSIS — Z20822 Contact with and (suspected) exposure to covid-19: Secondary | ICD-10-CM | POA: Diagnosis not present

## 2020-09-06 DIAGNOSIS — M797 Fibromyalgia: Secondary | ICD-10-CM | POA: Diagnosis not present

## 2020-11-16 DIAGNOSIS — R918 Other nonspecific abnormal finding of lung field: Secondary | ICD-10-CM | POA: Diagnosis not present

## 2020-11-16 DIAGNOSIS — J189 Pneumonia, unspecified organism: Secondary | ICD-10-CM | POA: Diagnosis not present

## 2020-11-16 DIAGNOSIS — R0602 Shortness of breath: Secondary | ICD-10-CM | POA: Diagnosis not present

## 2020-11-16 DIAGNOSIS — Z20822 Contact with and (suspected) exposure to covid-19: Secondary | ICD-10-CM | POA: Diagnosis not present

## 2020-11-16 DIAGNOSIS — R059 Cough, unspecified: Secondary | ICD-10-CM | POA: Diagnosis not present

## 2020-11-22 DIAGNOSIS — J189 Pneumonia, unspecified organism: Secondary | ICD-10-CM | POA: Diagnosis not present

## 2021-02-07 DIAGNOSIS — Z03818 Encounter for observation for suspected exposure to other biological agents ruled out: Secondary | ICD-10-CM | POA: Diagnosis not present

## 2021-02-07 DIAGNOSIS — Z20822 Contact with and (suspected) exposure to covid-19: Secondary | ICD-10-CM | POA: Diagnosis not present

## 2021-04-02 DIAGNOSIS — M2352 Chronic instability of knee, left knee: Secondary | ICD-10-CM | POA: Diagnosis not present

## 2021-04-02 DIAGNOSIS — M1711 Unilateral primary osteoarthritis, right knee: Secondary | ICD-10-CM | POA: Diagnosis not present

## 2021-04-02 DIAGNOSIS — M1712 Unilateral primary osteoarthritis, left knee: Secondary | ICD-10-CM | POA: Diagnosis not present

## 2021-04-02 DIAGNOSIS — M222X2 Patellofemoral disorders, left knee: Secondary | ICD-10-CM | POA: Diagnosis not present

## 2021-04-02 DIAGNOSIS — M2351 Chronic instability of knee, right knee: Secondary | ICD-10-CM | POA: Diagnosis not present

## 2021-04-02 DIAGNOSIS — M25561 Pain in right knee: Secondary | ICD-10-CM | POA: Diagnosis not present

## 2021-04-02 DIAGNOSIS — M25762 Osteophyte, left knee: Secondary | ICD-10-CM | POA: Diagnosis not present

## 2021-04-02 DIAGNOSIS — M170 Bilateral primary osteoarthritis of knee: Secondary | ICD-10-CM | POA: Diagnosis not present

## 2021-04-02 DIAGNOSIS — M25761 Osteophyte, right knee: Secondary | ICD-10-CM | POA: Diagnosis not present

## 2021-04-02 DIAGNOSIS — M222X1 Patellofemoral disorders, right knee: Secondary | ICD-10-CM | POA: Diagnosis not present

## 2021-04-09 DIAGNOSIS — M1712 Unilateral primary osteoarthritis, left knee: Secondary | ICD-10-CM | POA: Diagnosis not present

## 2021-04-09 DIAGNOSIS — M2352 Chronic instability of knee, left knee: Secondary | ICD-10-CM | POA: Diagnosis not present

## 2021-04-11 DIAGNOSIS — M1711 Unilateral primary osteoarthritis, right knee: Secondary | ICD-10-CM | POA: Diagnosis not present

## 2021-04-18 DIAGNOSIS — M1712 Unilateral primary osteoarthritis, left knee: Secondary | ICD-10-CM | POA: Diagnosis not present

## 2021-04-19 DIAGNOSIS — M1711 Unilateral primary osteoarthritis, right knee: Secondary | ICD-10-CM | POA: Diagnosis not present

## 2021-04-25 DIAGNOSIS — M1712 Unilateral primary osteoarthritis, left knee: Secondary | ICD-10-CM | POA: Diagnosis not present

## 2021-04-26 DIAGNOSIS — M1711 Unilateral primary osteoarthritis, right knee: Secondary | ICD-10-CM | POA: Diagnosis not present

## 2021-04-26 DIAGNOSIS — M2351 Chronic instability of knee, right knee: Secondary | ICD-10-CM | POA: Diagnosis not present

## 2021-04-30 DIAGNOSIS — M1712 Unilateral primary osteoarthritis, left knee: Secondary | ICD-10-CM | POA: Diagnosis not present

## 2021-05-02 DIAGNOSIS — M1711 Unilateral primary osteoarthritis, right knee: Secondary | ICD-10-CM | POA: Diagnosis not present

## 2021-05-20 ENCOUNTER — Emergency Department (HOSPITAL_BASED_OUTPATIENT_CLINIC_OR_DEPARTMENT_OTHER)
Admission: EM | Admit: 2021-05-20 | Discharge: 2021-05-20 | Disposition: A | Discharge status: Home / Self Care | Payer: BC Managed Care – PPO | Attending: Emergency Medicine | Admitting: Emergency Medicine

## 2021-05-20 ENCOUNTER — Encounter (HOSPITAL_BASED_OUTPATIENT_CLINIC_OR_DEPARTMENT_OTHER): Payer: Self-pay | Admitting: Emergency Medicine

## 2021-05-20 ENCOUNTER — Emergency Department (HOSPITAL_BASED_OUTPATIENT_CLINIC_OR_DEPARTMENT_OTHER): Payer: BC Managed Care – PPO

## 2021-05-20 DIAGNOSIS — H9202 Otalgia, left ear: Secondary | ICD-10-CM | POA: Diagnosis not present

## 2021-05-20 DIAGNOSIS — H9209 Otalgia, unspecified ear: Secondary | ICD-10-CM | POA: Diagnosis not present

## 2021-05-20 LAB — CBC WITH DIFFERENTIAL/PLATELET
Abs Immature Granulocytes: 0.03 10*3/uL (ref 0.00–0.07)
Basophils Absolute: 0 10*3/uL (ref 0.0–0.1)
Basophils Relative: 0 %
Eosinophils Absolute: 0.7 10*3/uL — ABNORMAL HIGH (ref 0.0–0.5)
Eosinophils Relative: 7 %
HCT: 37.1 % (ref 36.0–46.0)
Hemoglobin: 11.2 g/dL — ABNORMAL LOW (ref 12.0–15.0)
Immature Granulocytes: 0 %
Lymphocytes Relative: 21 %
Lymphs Abs: 2.1 10*3/uL (ref 0.7–4.0)
MCH: 20.9 pg — ABNORMAL LOW (ref 26.0–34.0)
MCHC: 30.2 g/dL (ref 30.0–36.0)
MCV: 69.2 fL — ABNORMAL LOW (ref 80.0–100.0)
Monocytes Absolute: 0.6 10*3/uL (ref 0.1–1.0)
Monocytes Relative: 6 %
Neutro Abs: 6.2 10*3/uL (ref 1.7–7.7)
Neutrophils Relative %: 66 %
Platelets: 365 10*3/uL (ref 150–400)
RBC: 5.36 MIL/uL — ABNORMAL HIGH (ref 3.87–5.11)
RDW: 19.2 % — ABNORMAL HIGH (ref 11.5–15.5)
WBC: 9.6 10*3/uL (ref 4.0–10.5)
nRBC: 0 % (ref 0.0–0.2)

## 2021-05-20 LAB — BASIC METABOLIC PANEL
Anion gap: 6 (ref 5–15)
BUN: 8 mg/dL (ref 6–20)
CO2: 26 mmol/L (ref 22–32)
Calcium: 9.7 mg/dL (ref 8.9–10.3)
Chloride: 103 mmol/L (ref 98–111)
Creatinine, Ser: 0.9 mg/dL (ref 0.44–1.00)
GFR, Estimated: >60 mL/min (ref >60)
Glucose, Bld: 96 mg/dL (ref 70–99)
Potassium: 4.1 mmol/L (ref 3.5–5.1)
Sodium: 135 mmol/L (ref 135–145)

## 2021-05-20 LAB — HCG, SERUM, QUALITATIVE: Preg, Serum: NEGATIVE

## 2021-05-20 MED ORDER — KETOROLAC TROMETHAMINE 15 MG/ML IJ SOLN
15.0000 mg | Freq: Once | INTRAMUSCULAR | Status: AC
  Administered 2021-05-20: 15 mg via INTRAVENOUS
  Filled 2021-05-20: qty 1

## 2021-05-20 MED ORDER — IOHEXOL 300 MG/ML  SOLN
75.0000 mL | Freq: Once | INTRAMUSCULAR | Status: AC | PRN
  Administered 2021-05-20: 75 mL via INTRAVENOUS

## 2021-05-20 NOTE — ED Notes (Signed)
Patient transported to CT 

## 2021-05-20 NOTE — Discharge Instructions (Addendum)
If you develop significantly worsening pain, fever, or other new concerning symptom, come back to ER for reassessment.  Otherwise recommend taking Tylenol or Motrin as needed and following up with your primary care doctor.

## 2021-05-20 NOTE — ED Provider Notes (Signed)
[facility] EMERGENCY DEPT Provider Note   CSN: [id number] Arrival date & time: 05/20/21  1639     History Chief Complaint  Patient presents with   Otalgia    Nancy Reeves is a 40 y.o. female.  Presents to ER with concern for left ear pain.  Patient states that symptoms started today.  Having pain right behind left ear.  Relatively constant.  Mild to moderate.  Aching sensation.  She states that she has headache but struggles from daily headaches and migraines, has history of pseudotumor cerebri.  No difference in her regular headache today.  Went to urgent care and was recommended she go to ER to be evaluated for possible mastoiditis.  She has not had any fevers or chills, otherwise feels fine.  No loss of hearing.  HPI     Past Medical History:  Diagnosis Date   Fibromyalgia    GERD (gastroesophageal reflux disease)    Migraine    psuedotumor cerebra   Pseudotumor cerebri     Patient Active Problem List   Diagnosis Date Noted   Vaginal yeast infection 08/27/2019   C. difficile colitis    C. Diff Colitis 08/12/2019   Hyponatremia 08/12/2019   Leucocytosis 08/12/2019    Past Surgical History:  Procedure Laterality Date   LUMBAR PUNCTURE       OB History   No obstetric history on file.     No family history on file.  Social History   Tobacco Use   Smoking status: Never   Smokeless tobacco: Never  Vaping Use   Vaping Use: Never used  Substance Use Topics   Alcohol use: Not Currently    Comment: occasional   Drug use: No    Home Medications Prior to Admission medications   Medication Sig Start Date End Date Taking? Authorizing Provider  famotidine (PEPCID) 40 MG tablet Take 40 mg by mouth daily.    Yes [provider]  tiZANidine (ZANAFLEX) 4 MG capsule Take 4 mg by mouth daily.   Yes [provider]  albuterol (VENTOLIN HFA) 108 (90 Base) MCG/ACT inhaler Inhale 1 puff into the lungs every 4 (four) hours. 06/06/20   Dione Booze, MD  dicyclomine (BENTYL) 10 MG capsule Take 1 capsule (10 mg total) by mouth 3 (three) times daily before meals. 08/18/19   Almon Hercules, MD  fluconazole (DIFLUCAN) 150 MG tablet Take one pill today and one tablet in 1 week 08/26/19   Zehr, Princella Pellegrini, PA-C  medroxyPROGESTERone (PROVERA) 10 MG tablet Take 10 mg by mouth daily.  07/08/19   [provider]  metFORMIN (GLUCOPHAGE-XR) 500 MG 24 hr tablet Take 500 mg by mouth at bedtime. 07/08/19   [provider]  methocarbamol (ROBAXIN) 500 MG tablet Take 1 tablet (500 mg total) by mouth 2 (two) times daily. 10/31/19   Gailen Shelter, PA  predniSONE (DELTASONE) 50 MG tablet Take 1 tablet (50 mg total) by mouth daily. 06/06/20   Dione Booze, MD  promethazine (PHENERGAN) 12.5 MG tablet Take 1 tablet (12.5 mg total) by mouth every 8 (eight) hours as needed for nausea or vomiting. 08/18/19   Almon Hercules, MD  saccharomyces boulardii (FLORASTOR) 250 MG capsule Take 1 capsule (250 mg total) by mouth 2 (two) times daily. 08/18/19   Almon Hercules, MD  DULoxetine (CYMBALTA) 60 MG capsule Take 60 mg by mouth daily.  07/19/19  [provider]  gabapentin (NEURONTIN) 300 MG capsule Take 300 mg by mouth  3 (three) times daily.  07/19/19  [provider]    Allergies    Triptans  Review of Systems   Review of Systems  Constitutional:  Negative for chills and fever.  HENT:  Positive for ear pain. Negative for sore throat.   Eyes:  Negative for pain and visual disturbance.  Respiratory:  Negative for cough and shortness of breath.   Cardiovascular:  Negative for chest pain and palpitations.  Gastrointestinal:  Negative for abdominal pain and vomiting.  Genitourinary:  Negative for dysuria and hematuria.  Musculoskeletal:  Negative for arthralgias and back pain.  Skin:  Negative for color change and rash.  Neurological:  Negative for seizures and syncope.  All other systems reviewed and are negative.  Physical  Exam Updated Vital Signs BP 113/78   Pulse 91   Temp 98.3 F (36.8 C) (Oral)   Resp 18   Ht 5\' 3"  (1.6 m)   Wt 102.1 kg   SpO2 98%   BMI 39.86 kg/m   Physical Exam Vitals and nursing note reviewed.  Constitutional:      General: She is not in acute distress.    Appearance: She is well-developed.  HENT:     Head: Normocephalic and atraumatic.     Right Ear: Tympanic membrane, ear canal and external ear normal.     Left Ear: Tympanic membrane, ear canal and external ear normal.     Ears:     Comments: Mild tenderness over the left mastoid process, no erythema Eyes:     Conjunctiva/sclera: Conjunctivae normal.  Cardiovascular:     Rate and Rhythm: Normal rate and regular rhythm.     Heart sounds: No murmur heard. Pulmonary:     Effort: Pulmonary effort is normal. No respiratory distress.     Breath sounds: Normal breath sounds.  Abdominal:     Palpations: Abdomen is soft.     Tenderness: There is no abdominal tenderness.  Musculoskeletal:     Cervical back: Neck supple.  Skin:    General: Skin is warm and dry.  Neurological:     Mental Status: She is alert.    ED Results / Procedures / Treatments   Labs (all labs ordered are listed, but only abnormal results are displayed) Labs Reviewed  CBC WITH DIFFERENTIAL/PLATELET - Abnormal; Notable for the following components:      Result Value   RBC 5.36 (*)    Hemoglobin 11.2 (*)    MCV 69.2 (*)    MCH 20.9 (*)    RDW 19.2 (*)    Eosinophils Absolute 0.7 (*)    All other components within normal limits  BASIC METABOLIC PANEL  HCG, SERUM, QUALITATIVE    EKG None  Radiology CT Temporal Bones W Contrast  Result Date: 05/20/2021 CLINICAL DATA:  Left mastoid process tenderness EXAM: CT TEMPORAL BONES WITH CONTRAST TECHNIQUE: Axial and coronal plane CT imaging of the petrous temporal bones was performed with thin-collimation image reconstruction following intravenous contrast administration. Multiplanar CT image  reconstructions were also generated. CONTRAST:  27mL OMNIPAQUE IOHEXOL 300 MG/ML  SOLN COMPARISON:  No pertinent prior exam. FINDINGS: Right temporal bone: External auditory canal and tympanic membrane are unremarkable. Middle ear cleft and ossicles are unremarkable. Cochlea and semicircular canals are unremarkable. Tegmen is intact. Facial nerve demonstrates normal course. Mastoid air cells are clear. Left temporal bone: External auditory canal and tympanic membrane are unremarkable. Middle ear cleft and ossicles are unremarkable. Cochlea and semicircular canals are unremarkable. Tegmen is intact.  Facial nerve demonstrates normal course. Mastoid air cells are clear. Vascular: Normal non-contrast appearance of the carotid canals, jugular bulbs and sigmoid plates. Limited intracranial:  No abnormal enhancement. Visible orbits/paranasal sinuses: Orbits are unremarkable. Mild paranasal sinus mucosal thickening. Soft tissues: Unremarkable. IMPRESSION: Left mastoid and middle ear are aerated. No evidence of mastoiditis. Electronically Signed   By: Guadlupe Spanish M.D.   On: 05/20/2021 19:05    Procedures Procedures   Medications Ordered in ED Medications  ketorolac (TORADOL) 15 MG/ML injection 15 mg (15 mg Intravenous Given 05/20/21 1739)  iohexol (OMNIPAQUE) 300 MG/ML solution 75 mL (75 mLs Intravenous Contrast Given 05/20/21 1812)    ED Course  I have reviewed the triage vital signs and the nursing notes.  Pertinent labs & imaging results that were available during my care of the patient were reviewed by me and considered in my medical decision making (see chart for details).    MDM Rules/Calculators/A&P                          40 year old lady presents to ER with concern for left ear pain.  At urgent care provider recommended going to ER to rule out mastoiditis.  On exam patient's ear appears normal.  She did have some mild tenderness over her mastoid process.  There was no erythema.  No fever.  On an  abundance of precaution, I did check labs and CT scan.  Basic labs were grossly within normal limits.  CT negative for mastoiditis.  For now recommend discharge and follow-up with primary doctor.  After the discussed management above, the patient was determined to be safe for discharge.  The patient was in agreement with this plan and all questions regarding their care were answered.  ED return precautions were discussed and the patient will return to the ED with any significant worsening of condition.  Final Clinical Impression(s) / ED Diagnoses Final diagnoses:  Left ear pain    Rx / DC Orders ED Discharge Orders     None        Milagros Loll, MD 05/20/21 [phone]

## 2021-05-20 NOTE — ED Triage Notes (Signed)
Reports left mastoid pain, sudden today. Pt endorses migraine and dizziness.

## 2021-05-22 DIAGNOSIS — N62 Hypertrophy of breast: Secondary | ICD-10-CM | POA: Insufficient documentation

## 2021-07-02 DIAGNOSIS — Z6841 Body Mass Index (BMI) 40.0 and over, adult: Secondary | ICD-10-CM | POA: Diagnosis not present

## 2021-07-02 DIAGNOSIS — M797 Fibromyalgia: Secondary | ICD-10-CM | POA: Diagnosis not present

## 2021-07-02 DIAGNOSIS — E2839 Other primary ovarian failure: Secondary | ICD-10-CM | POA: Insufficient documentation

## 2021-07-02 DIAGNOSIS — E669 Obesity, unspecified: Secondary | ICD-10-CM | POA: Diagnosis not present

## 2021-07-04 ENCOUNTER — Institutional Professional Consult (permissible substitution): Payer: BC Managed Care – PPO | Admitting: Plastic Surgery

## 2021-08-22 DIAGNOSIS — E669 Obesity, unspecified: Secondary | ICD-10-CM | POA: Diagnosis not present

## 2021-08-22 DIAGNOSIS — G932 Benign intracranial hypertension: Secondary | ICD-10-CM | POA: Diagnosis not present

## 2021-08-22 DIAGNOSIS — E559 Vitamin D deficiency, unspecified: Secondary | ICD-10-CM | POA: Diagnosis not present

## 2021-08-22 DIAGNOSIS — R635 Abnormal weight gain: Secondary | ICD-10-CM | POA: Diagnosis not present

## 2021-08-22 DIAGNOSIS — Z13220 Encounter for screening for lipoid disorders: Secondary | ICD-10-CM | POA: Diagnosis not present

## 2021-08-27 DIAGNOSIS — E559 Vitamin D deficiency, unspecified: Secondary | ICD-10-CM | POA: Diagnosis not present

## 2021-08-27 DIAGNOSIS — Z6841 Body Mass Index (BMI) 40.0 and over, adult: Secondary | ICD-10-CM | POA: Diagnosis not present

## 2021-08-27 DIAGNOSIS — G932 Benign intracranial hypertension: Secondary | ICD-10-CM | POA: Diagnosis not present

## 2021-09-04 DIAGNOSIS — R519 Headache, unspecified: Secondary | ICD-10-CM | POA: Diagnosis not present

## 2021-09-04 DIAGNOSIS — G932 Benign intracranial hypertension: Secondary | ICD-10-CM | POA: Diagnosis not present

## 2021-09-05 DIAGNOSIS — M797 Fibromyalgia: Secondary | ICD-10-CM | POA: Diagnosis not present

## 2021-09-10 DIAGNOSIS — M5481 Occipital neuralgia: Secondary | ICD-10-CM | POA: Diagnosis not present

## 2021-09-10 DIAGNOSIS — R519 Headache, unspecified: Secondary | ICD-10-CM | POA: Diagnosis not present

## 2021-10-01 DIAGNOSIS — G932 Benign intracranial hypertension: Secondary | ICD-10-CM | POA: Diagnosis not present

## 2021-10-01 DIAGNOSIS — Z6841 Body Mass Index (BMI) 40.0 and over, adult: Secondary | ICD-10-CM | POA: Diagnosis not present

## 2021-10-01 DIAGNOSIS — E559 Vitamin D deficiency, unspecified: Secondary | ICD-10-CM | POA: Diagnosis not present

## 2021-10-24 DIAGNOSIS — M1712 Unilateral primary osteoarthritis, left knee: Secondary | ICD-10-CM | POA: Diagnosis not present

## 2021-10-29 DIAGNOSIS — Z713 Dietary counseling and surveillance: Secondary | ICD-10-CM | POA: Diagnosis not present

## 2021-10-31 ENCOUNTER — Encounter (HOSPITAL_BASED_OUTPATIENT_CLINIC_OR_DEPARTMENT_OTHER): Payer: Self-pay

## 2021-10-31 ENCOUNTER — Other Ambulatory Visit: Payer: Self-pay

## 2021-10-31 DIAGNOSIS — R9431 Abnormal electrocardiogram [ECG] [EKG]: Secondary | ICD-10-CM | POA: Diagnosis not present

## 2021-10-31 DIAGNOSIS — K649 Unspecified hemorrhoids: Secondary | ICD-10-CM | POA: Diagnosis not present

## 2021-10-31 DIAGNOSIS — K6289 Other specified diseases of anus and rectum: Secondary | ICD-10-CM | POA: Diagnosis not present

## 2021-10-31 NOTE — ED Triage Notes (Signed)
Patient states she has problems with hemorrhoids and fissures and is having bleeding from that.  She states tonight the bleeding has worsened.

## 2021-11-01 ENCOUNTER — Emergency Department (HOSPITAL_BASED_OUTPATIENT_CLINIC_OR_DEPARTMENT_OTHER): Payer: BC Managed Care – PPO

## 2021-11-01 ENCOUNTER — Emergency Department (HOSPITAL_BASED_OUTPATIENT_CLINIC_OR_DEPARTMENT_OTHER)
Admission: EM | Admit: 2021-11-01 | Discharge: 2021-11-01 | Disposition: A | Discharge status: Home / Self Care | Payer: BC Managed Care – PPO | Attending: Emergency Medicine | Admitting: Emergency Medicine

## 2021-11-01 ENCOUNTER — Encounter (HOSPITAL_BASED_OUTPATIENT_CLINIC_OR_DEPARTMENT_OTHER): Payer: Self-pay

## 2021-11-01 DIAGNOSIS — K649 Unspecified hemorrhoids: Secondary | ICD-10-CM

## 2021-11-01 DIAGNOSIS — K6289 Other specified diseases of anus and rectum: Secondary | ICD-10-CM

## 2021-11-01 LAB — COMPREHENSIVE METABOLIC PANEL
ALT: 17 U/L (ref 0–44)
AST: 18 U/L (ref 15–41)
Albumin: 3.8 g/dL (ref 3.5–5.0)
Alkaline Phosphatase: 59 U/L (ref 38–126)
Anion gap: 9 (ref 5–15)
BUN: 8 mg/dL (ref 6–20)
CO2: 25 mmol/L (ref 22–32)
Calcium: 9.3 mg/dL (ref 8.9–10.3)
Chloride: 100 mmol/L (ref 98–111)
Creatinine, Ser: 0.74 mg/dL (ref 0.44–1.00)
GFR, Estimated: >60 mL/min (ref >60)
Glucose, Bld: 94 mg/dL (ref 70–99)
Potassium: 3.5 mmol/L (ref 3.5–5.1)
Sodium: 134 mmol/L — ABNORMAL LOW (ref 135–145)
Total Bilirubin: 0.4 mg/dL (ref 0.3–1.2)
Total Protein: 8.5 g/dL — ABNORMAL HIGH (ref 6.5–8.1)

## 2021-11-01 LAB — CBC WITH DIFFERENTIAL/PLATELET
Abs Immature Granulocytes: 0.02 10*3/uL (ref 0.00–0.07)
Basophils Absolute: 0 10*3/uL (ref 0.0–0.1)
Basophils Relative: 0 %
Eosinophils Absolute: 0.4 10*3/uL (ref 0.0–0.5)
Eosinophils Relative: 4 %
HCT: 34.1 % — ABNORMAL LOW (ref 36.0–46.0)
Hemoglobin: 10.4 g/dL — ABNORMAL LOW (ref 12.0–15.0)
Immature Granulocytes: 0 %
Lymphocytes Relative: 23 %
Lymphs Abs: 2.2 10*3/uL (ref 0.7–4.0)
MCH: 20.9 pg — ABNORMAL LOW (ref 26.0–34.0)
MCHC: 30.5 g/dL (ref 30.0–36.0)
MCV: 68.6 fL — ABNORMAL LOW (ref 80.0–100.0)
Monocytes Absolute: 0.8 10*3/uL (ref 0.1–1.0)
Monocytes Relative: 8 %
Neutro Abs: 6 10*3/uL (ref 1.7–7.7)
Neutrophils Relative %: 65 %
Platelets: 374 10*3/uL (ref 150–400)
RBC: 4.97 MIL/uL (ref 3.87–5.11)
RDW: 17.9 % — ABNORMAL HIGH (ref 11.5–15.5)
WBC: 9.4 10*3/uL (ref 4.0–10.5)
nRBC: 0 % (ref 0.0–0.2)

## 2021-11-01 LAB — LACTIC ACID, PLASMA: Lactic Acid, Venous: 0.9 mmol/L (ref 0.5–1.9)

## 2021-11-01 LAB — MAGNESIUM: Magnesium: 1.9 mg/dL (ref 1.7–2.4)

## 2021-11-01 LAB — PROTIME-INR
INR: 1 (ref 0.8–1.2)
Prothrombin Time: 12.7 seconds (ref 11.4–15.2)

## 2021-11-01 MED ORDER — LACTATED RINGERS IV BOLUS (SEPSIS)
1000.0000 mL | Freq: Once | INTRAVENOUS | Status: AC
  Administered 2021-11-01: 1000 mL via INTRAVENOUS

## 2021-11-01 MED ORDER — HYDROCODONE-ACETAMINOPHEN 5-325 MG PO TABS
2.0000 | ORAL_TABLET | Freq: Once | ORAL | Status: AC
Start: 2021-11-01 — End: 2021-11-01
  Administered 2021-11-01: 2 via ORAL
  Filled 2021-11-01: qty 2

## 2021-11-01 MED ORDER — KETOROLAC TROMETHAMINE 15 MG/ML IJ SOLN
15.0000 mg | Freq: Once | INTRAMUSCULAR | Status: AC
  Administered 2021-11-01: 15 mg via INTRAVENOUS
  Filled 2021-11-01: qty 1

## 2021-11-01 MED ORDER — IOHEXOL 300 MG/ML  SOLN
100.0000 mL | Freq: Once | INTRAMUSCULAR | Status: AC | PRN
  Administered 2021-11-01: 100 mL via INTRAVENOUS

## 2021-11-01 NOTE — ED Notes (Signed)
Pt verbalizes understanding of discharge instructions. Opportunity for questioning and answers were provided. Pt discharged from ED to home.   ? ?

## 2021-11-01 NOTE — ED Provider Notes (Signed)
[facility] EMERGENCY DEPT Provider Note   CSN: [id number] Arrival date & time: 10/31/21  2113     History Chief Complaint  Patient presents with   Hemorrhoids    Nancy Reeves is a 40 y.o. female.  HPI Patient presents for rectal pain.  She has a history of hemorrhoids.  2 years ago, she underwent surgical management of her hemorrhoids.  She has had intermittent pain and bleeding over the past year.  She has avoided evaluation by medical providers because she had a difficult postoperative course 2 years ago.  Pain became severe today.  Patient has not had fevers but does endorse recent chills.  She denies any lightheadedness or dizziness.    Past Medical History:  Diagnosis Date   Fibromyalgia    GERD (gastroesophageal reflux disease)    Migraine    psuedotumor cerebra   Pseudotumor cerebri     Patient Active Problem List   Diagnosis Date Noted   Vaginal yeast infection 08/27/2019   C. difficile colitis    C. Diff Colitis 08/12/2019   Hyponatremia 08/12/2019   Leucocytosis 08/12/2019    Past Surgical History:  Procedure Laterality Date   LUMBAR PUNCTURE       OB History   No obstetric history on file.     History reviewed. No pertinent family history.  Social History   Tobacco Use   Smoking status: Never   Smokeless tobacco: Never  Vaping Use   Vaping Use: Never used  Substance Use Topics   Alcohol use: Not Currently    Comment: occasional   Drug use: No    Home Medications Prior to Admission medications   Medication Sig Start Date End Date Taking? Authorizing Provider  albuterol (VENTOLIN HFA) 108 (90 Base) MCG/ACT inhaler Inhale 1 puff into the lungs every 4 (four) hours. 06/06/20   Dione Booze, MD  dicyclomine (BENTYL) 10 MG capsule Take 1 capsule (10 mg total) by mouth 3 (three) times daily before meals. 08/18/19   Almon Hercules, MD  famotidine (PEPCID) 40 MG tablet Take 40 mg by mouth daily.     [provider]   fluconazole (DIFLUCAN) 150 MG tablet Take one pill today and one tablet in 1 week 08/26/19   Zehr, Princella Pellegrini, PA-C  medroxyPROGESTERone (PROVERA) 10 MG tablet Take 10 mg by mouth daily.  07/08/19   [provider]  metFORMIN (GLUCOPHAGE-XR) 500 MG 24 hr tablet Take 500 mg by mouth at bedtime. 07/08/19   [provider]  methocarbamol (ROBAXIN) 500 MG tablet Take 1 tablet (500 mg total) by mouth 2 (two) times daily. 10/31/19   Gailen Shelter, PA  predniSONE (DELTASONE) 50 MG tablet Take 1 tablet (50 mg total) by mouth daily. 06/06/20   Dione Booze, MD  promethazine (PHENERGAN) 12.5 MG tablet Take 1 tablet (12.5 mg total) by mouth every 8 (eight) hours as needed for nausea or vomiting. 08/18/19   Almon Hercules, MD  saccharomyces boulardii (FLORASTOR) 250 MG capsule Take 1 capsule (250 mg total) by mouth 2 (two) times daily. 08/18/19   Almon Hercules, MD  tiZANidine (ZANAFLEX) 4 MG capsule Take 4 mg by mouth daily.    [provider]  DULoxetine (CYMBALTA) 60 MG capsule Take 60 mg by mouth daily.  07/19/19  [provider]  gabapentin (NEURONTIN) 300 MG capsule Take 300 mg by mouth 3 (three) times daily.  07/19/19  [provider]    Allergies    Triptans  Review  of Systems   Review of Systems  Constitutional:  Positive for chills. Negative for activity change, appetite change and fever.  HENT:  Negative for congestion, ear pain and sore throat.   Eyes:  Negative for pain and visual disturbance.  Respiratory:  Negative for cough and shortness of breath.   Cardiovascular:  Negative for chest pain and palpitations.  Gastrointestinal:  Positive for anal bleeding, blood in stool and rectal pain. Negative for abdominal pain, nausea and vomiting.  Genitourinary:  Negative for dysuria, flank pain, hematuria, pelvic pain, vaginal bleeding and vaginal discharge.  Musculoskeletal:  Negative for arthralgias, back pain, joint swelling, myalgias and neck pain.  Skin:   Negative for color change and rash.  Allergic/Immunologic: Negative for immunocompromised state.  Neurological:  Negative for dizziness, seizures, syncope, weakness, numbness and headaches.  Hematological:  Does not bruise/bleed easily.  Psychiatric/Behavioral:  Negative for confusion and decreased concentration.   All other systems reviewed and are negative.  Physical Exam Updated Vital Signs BP 117/82    Pulse 88    Temp 99.1 F (37.3 C)    Resp 17    Ht 5\' 4"  (1.626 m)    Wt 97.5 kg    SpO2 97%    BMI 36.90 kg/m   Physical Exam Vitals and nursing note reviewed. Exam conducted with a chaperone present.  Constitutional:      General: She is not in acute distress.    Appearance: Normal appearance. She is well-developed. She is not ill-appearing, toxic-appearing or diaphoretic.  HENT:     Head: Normocephalic and atraumatic.     Right Ear: External ear normal.     Left Ear: External ear normal.     Nose: Nose normal. No congestion.     Mouth/Throat:     Mouth: Mucous membranes are moist.     Pharynx: Oropharynx is clear.  Eyes:     Conjunctiva/sclera: Conjunctivae normal.  Cardiovascular:     Rate and Rhythm: Regular rhythm. Tachycardia present.     Heart sounds: No murmur heard. Pulmonary:     Effort: Pulmonary effort is normal. No respiratory distress.     Breath sounds: Normal breath sounds. No wheezing or rales.  Abdominal:     Palpations: Abdomen is soft.     Tenderness: There is no abdominal tenderness.  Genitourinary:    Comments: Induration of perianal area, no external hemorrhoids, no gross blood present, mucopurulence is present. Musculoskeletal:        General: No swelling.     Cervical back: Neck supple.  Skin:    General: Skin is warm and dry.     Capillary Refill: Capillary refill takes less than 2 seconds.  Neurological:     Mental Status: She is alert.  Psychiatric:        Mood and Affect: Mood normal.    ED Results / Procedures / Treatments    Labs (all labs ordered are listed, but only abnormal results are displayed) Labs Reviewed  COMPREHENSIVE METABOLIC PANEL - Abnormal; Notable for the following components:      Result Value   Sodium 134 (*)    Total Protein 8.5 (*)    All other components within normal limits  CBC WITH DIFFERENTIAL/PLATELET - Abnormal; Notable for the following components:   Hemoglobin 10.4 (*)    HCT 34.1 (*)    MCV 68.6 (*)    MCH 20.9 (*)    RDW 17.9 (*)    All other components within normal limits  CULTURE, BLOOD (ROUTINE X 2)  CULTURE, BLOOD (ROUTINE X 2)  LACTIC ACID, PLASMA  PROTIME-INR  MAGNESIUM  URINALYSIS, ROUTINE W REFLEX MICROSCOPIC  PREGNANCY, URINE    EKG None  Radiology CT PELVIS W CONTRAST  Result Date: 11/01/2021 CLINICAL DATA:  Hemorrhoids, bleeding EXAM: CT PELVIS WITH CONTRAST TECHNIQUE: Multidetector CT imaging of the pelvis was performed using the standard protocol following the bolus administration of intravenous contrast. CONTRAST:  193mL OMNIPAQUE IOHEXOL 300 MG/ML  SOLN COMPARISON:  CT abdomen/pelvis dated 08/15/2019 FINDINGS: Urinary Tract:  Bladder is within normal limits. Bowel: Normal appendix. Mild rectal wall thickening with serpiginous enhancement suggesting internal hemorrhoids (for example, series 2/image 31). Mild perirectal inflammatory stranding. No evidence of perianal fistula or abscess. Vascular/Lymphatic: No evidence of aneurysm. 13 mm short axis left perirectal node (series 2/image 22), likely reactive. Reproductive: Uterus and bilateral ovaries are within normal limits. Other:  No pelvic ascites. Musculoskeletal: Visualized osseous structures are within normal limits. IMPRESSION: No evidence of perianal fistula or abscess. Mild rectal wall thickening with perirectal inflammatory stranding, suggesting proctitis. 13 mm short axis left perirectal node, reactive. Suspected internal hemorrhoids. Electronically Signed   By: Julian Hy M.D.   On: 11/01/2021  03:00    Procedures Procedures   Medications Ordered in ED Medications  lactated ringers bolus 1,000 mL (0 mLs Intravenous Stopped 11/01/21 0333)  ketorolac (TORADOL) 15 MG/ML injection 15 mg (15 mg Intravenous Given 11/01/21 0205)  HYDROcodone-acetaminophen (NORCO/VICODIN) 5-325 MG per tablet 2 tablet (2 tablets Oral Given 11/01/21 0206)  iohexol (OMNIPAQUE) 300 MG/ML solution 100 mL (100 mLs Intravenous Contrast Given 11/01/21 0246)    ED Course  I have reviewed the triage vital signs and the nursing notes.  Pertinent labs & imaging results that were available during my care of the patient were reviewed by me and considered in my medical decision making (see chart for details).    MDM Rules/Calculators/A&P                          Patient presents for acute on chronic rectal pain and bleeding.  She is tachycardic on arrival.  She endorses significant pain.  On exam, patient has induration and mucopurulence in the perirectal area.  CT scan of pelvis was obtained.  Patient was given IV fluids and pain control with subsequent normalization of vital signs.  Lab work showed no leukocytosis, and baseline microcytic anemia.  CT scan showed findings consistent with proctitis.  Internal hemorrhoids were also identified.  Patient adamantly denies any history of receptive anal sex.  She declined any GC/chlamydia swabs.  Given that she denies being at any risk for rectal STI, no antibiotics indicated at this time.  Patient would benefit from PCP and GI follow-up.  Etiology of her proctitis remains unclear.  Currently she has a appointment to establish PCP care next month.  Contact information for GI follow-up was provided.  Patient has had a long history with hemorrhoids and states that she is currently doing fiber supplementation and sitz bath's.  She was encouraged to continue this.  Patient was discharged in stable condition.  Final Clinical Impression(s) / ED Diagnoses Final diagnoses:  Proctitis   Hemorrhoids, unspecified hemorrhoid type    Rx / DC Orders ED Discharge Orders     None        Godfrey Pick, MD 11/01/21 [phone]

## 2021-11-06 LAB — CULTURE, BLOOD (ROUTINE X 2)
Culture: NO GROWTH
Specimen Description: ADEQUATE

## 2021-11-14 ENCOUNTER — Telehealth: Payer: BC Managed Care – PPO | Admitting: Physician Assistant

## 2021-11-14 DIAGNOSIS — K625 Hemorrhage of anus and rectum: Secondary | ICD-10-CM

## 2021-11-14 NOTE — Progress Notes (Signed)
Based on what you shared with me, I feel your condition warrants further evaluation and I recommend that you be seen in a face to face visit.  Given the location, hx of surgery and increased pain you need a face to face exam to ensure there is no infection, thrombosis or complication.   NOTE: There will be NO CHARGE for this eVisit   If you are having a true medical emergency please call 911.      For an urgent face to face visit, [place] has six urgent care centers for your convenience:     [facility] Ur[facility] Center at [facility] Directions [phone] [address] [place], [place] [address]    [facility] Health Urgent Care Center [facility]) Get Driving Directions [phone] [address] [place], [place] [address]  [facility] Health Urgent Care Center [facility]) Get Driving Directions [phone] [address] [place],  [place]  [address]  [facility] Health Urgent Care at [facility] Get Driving Directions [phone] [address] [place], [place] [address]   [facility] Health Urgent Care at [facility] Get Driving Directions  [phone] [address] [place], [place] [address]   [facility] Health Urgent Care at [facility] Directions [phone] [address] [place], [place] [address]  Your MyChart E-visit questionnaire answers were reviewed by a board certified advanced clinical practitioner to complete your personal care plan based on your specific symptoms.  Thank you for using e-Visits.  Greater than 5 minutes, yet less than 10 minutes of time have been spent researching, coordinating, and implementing care for this patient today

## 2021-11-22 ENCOUNTER — Encounter: Payer: Self-pay | Admitting: Internal Medicine

## 2021-11-22 ENCOUNTER — Other Ambulatory Visit (INDEPENDENT_AMBULATORY_CARE_PROVIDER_SITE_OTHER): Payer: 59

## 2021-11-22 ENCOUNTER — Ambulatory Visit (INDEPENDENT_AMBULATORY_CARE_PROVIDER_SITE_OTHER): Payer: 59 | Admitting: Internal Medicine

## 2021-11-22 VITALS — BP 124/70 | HR 98 | Ht 64.0 in | Wt 223.0 lb

## 2021-11-22 DIAGNOSIS — L309 Dermatitis, unspecified: Secondary | ICD-10-CM

## 2021-11-22 DIAGNOSIS — K6289 Other specified diseases of anus and rectum: Secondary | ICD-10-CM

## 2021-11-22 DIAGNOSIS — D509 Iron deficiency anemia, unspecified: Secondary | ICD-10-CM

## 2021-11-22 DIAGNOSIS — L98491 Non-pressure chronic ulcer of skin of other sites limited to breakdown of skin: Secondary | ICD-10-CM | POA: Diagnosis not present

## 2021-11-22 LAB — CBC WITH DIFFERENTIAL/PLATELET
Basophils Absolute: 0 10*3/uL (ref 0.0–0.1)
Basophils Relative: 0.2 % (ref 0.0–3.0)
Eosinophils Absolute: 0.4 10*3/uL (ref 0.0–0.7)
Eosinophils Relative: 4.4 % (ref 0.0–5.0)
HCT: 32.9 % — ABNORMAL LOW (ref 36.0–46.0)
Hemoglobin: 10.1 g/dL — ABNORMAL LOW (ref 12.0–15.0)
Lymphocytes Relative: 18.6 % (ref 12.0–46.0)
Lymphs Abs: 1.8 10*3/uL (ref 0.7–4.0)
MCHC: 30.8 g/dL (ref 30.0–36.0)
MCV: 67.5 fl — ABNORMAL LOW (ref 78.0–100.0)
Monocytes Absolute: 0.9 10*3/uL (ref 0.1–1.0)
Monocytes Relative: 9.2 % (ref 3.0–12.0)
Neutro Abs: 6.5 10*3/uL (ref 1.4–7.7)
Neutrophils Relative %: 67.6 % (ref 43.0–77.0)
Platelets: 353 10*3/uL (ref 150.0–400.0)
RBC: 4.87 Mil/uL (ref 3.87–5.11)
RDW: 17.8 % — ABNORMAL HIGH (ref 11.5–15.5)
WBC: 9.6 10*3/uL (ref 4.0–10.5)

## 2021-11-22 LAB — FERRITIN: Ferritin: 19.3 ng/mL (ref 10.0–291.0)

## 2021-11-22 LAB — C-REACTIVE PROTEIN: CRP: 5 mg/dL (ref 0.5–20.0)

## 2021-11-22 LAB — VITAMIN B12: Vitamin B-12: 691 pg/mL (ref 211–911)

## 2021-11-22 LAB — PROTIME-INR
INR: 1.1 ratio — ABNORMAL HIGH (ref 0.8–1.0)
Prothrombin Time: 11.6 s (ref 9.6–13.1)

## 2021-11-22 MED ORDER — GERHARDT'S BUTT CREAM: TOPICAL_CREAM | CUTANEOUS | 5 refills | Status: DC

## 2021-11-22 MED ORDER — PLENVU 140 G PO SOLR: 1.0000 | ORAL | 0 refills | Status: DC

## 2021-11-22 NOTE — Progress Notes (Signed)
Nancy Reeves 41 y.o. 04-01-1981 [id number]  Assessment & Plan:   Encounter Diagnoses  Name Primary?   Proctitis Yes   Ulcer of perianal area, limited to breakdown of skin (HCC)    Perianal dermatitis    Microcytic anemia     The overall trajectory of her problems and history suggest to me she may have inflammatory bowel disease, perhaps Crohn's disease with perianal involvement. The pathology report from 2020 shows granulomatous inflammation so I do think she has Crohn's disease. Further investigation is needed.  Plan for colonoscopy.  Dr. Hilarie Fredrickson saw her initially in consultation so he is the responsible gastroenterologist from our practice for her overall, and fortunately there was an opening tomorrow in his schedule and so she will have a colonoscopy tomorrow at 130.  She may need additional pelvic imaging with MRI etc. depending upon what is seen in question could there be fistulizing disease.  Additional evaluation of anemia as follows:  CBC B12 ferritin also check CRP and sed rate   Subjective:   Chief Complaint: Hemorrhoids, proctitis  HPI 41 year old African-American woman with a history of fibromyalgia and pseudotumor cerebri known to [place] from a hospitalization in 2020 (initial consult with Dr. Hilarie Fredrickson) due to C. difficile colitis.  She had hemorrhoid surgery in 2020 and says things have not been right since.  Review of records shows that Dr. Phineas Douglas at [facility] resected external hemorrhoids, a 3 cm anal tag and performed a lateral internal sphincterotomy for anal fissure.  She reports a rocky course since then and intermittent tears and pain and some rectal bleeding.  More recently she was put on phentermine and became constipated and aggravated things.  Fairly severe pain and difficulty with defecation she stopped that and stools have loosened up but she went to the emergency department at [facility] in mid December where a CT scan suggested  proctitis and also showed her hemorrhoids.  The ER physician described mucopurulent discharge from the anal and rectal area.  She reports that she does have that off and on but ongoing.  She does not talk about diarrhea there is a little bit of rectal bleeding.  She cannot ever tell when things are going to hurt she is fearful of defecation due to the pain.  She is never been given a diagnosis of inflammatory bowel disease.  She has never had a colonoscopy.  Pathology from hemorrhoidectomy as below 09/27/2019 ACCESSION NUMBER:  [id number]  RECEIVED: 09/27/2019  ORDERING PHYSICIAN:  MEADE TODD PALMER , MD  PATIENT NAME:  Reeves, Nancy DELORSE  SURGICAL PATHOLOGY REPORT   FINAL PATHOLOGIC DIAGNOSIS  A. TISSUE LABELED "POSTERIOR HEMORRHOID AND ANAL FISSURE ":       EXTERNAL HEMORRHOIDAL TISSUE SHOWING FOCAL ACUTE AND CHRONIC  INFLAMMATION      WITH GRANULATION TISSUE FORMATION CONSISTENT  WITH FISSURE.       NONCASEATING GRANULOMATOUS INFLAMMATION (SEE COMMENT).   B. TISSUE LABELED "ANTERIOR HEMORRHOID AND SKIN TAG":       ANAL SKIN SHOWING FOCAL MILD HEMORRHOIDAL CHANGES WITH FOCAL  ULCERATION AND      ACUTE AND CHRONIC INFLAMMATION.       EXTENSIVE NONCASEATING CHRONIC GRANULOMATOUS INFLAMMATION  (SEE COMMENT).       SPECIAL STAINS FOR ACID-FAST BACILLI AND FUNGI ARE NEGATIVE.      Lab Results  Component Value Date   WBC 9.4 11/01/2021   HGB 10.4 (L) 11/01/2021   HCT 34.1 (L) 11/01/2021   MCV 68.6 (L) 11/01/2021  PLT 374 11/01/2021   CT pelvis 11/01/2021 IMPRESSION: No evidence of perianal fistula or abscess.  Mild rectal wall thickening with perirectal inflammatory stranding, suggesting proctitis. 13 mm short axis left perirectal node, reactive.  Suspected internal hemorrhoids.    She recently started an antibiotic that begins with an S for a suspected UTI, this was provided through some sort of telehealth service.  She is awaiting or trying to get breast reduction  surgery and and was enrolled in a 39-month weight reduction program prior to that which is why she was on the phentermine (again this has been stopped).     Allergies  Allergen Reactions   Triptans Anaphylaxis   Current Meds  Medication Sig   albuterol (VENTOLIN HFA) 108 (90 Base) MCG/ACT inhaler Inhale 1 puff into the lungs every 4 (four) hours.   famotidine (PEPCID) 40 MG tablet Take 40 mg by mouth daily.    fluconazole (DIFLUCAN) 150 MG tablet Take one pill today and one tablet in 1 week   metFORMIN (GLUCOPHAGE-XR) 500 MG 24 hr tablet Take 500 mg by mouth at bedtime.   Nystatin (GERHARDT'S BUTT CREAM) CREA Apply to perianal area 2-4 times a day Disp # 1 jar   tiZANidine (ZANAFLEX) 4 MG capsule Take 4 mg by mouth daily.   Past Medical History:  Diagnosis Date   Clostridioides difficile infection    Fibromyalgia    GERD (gastroesophageal reflux disease)    Migraine    psuedotumor cerebra   Prediabetes    Pseudotumor cerebri    Past Surgical History:  Procedure Laterality Date   EXCISIONAL HEMORRHOIDECTOMY     LUMBAR PUNCTURE     Social History   Social History Narrative   Married   Occasional alcohol never tobacco no drug use   family history includes Migraines in her mother; Thyroid disease in her mother.   Review of Systems See HPI otherwise negative  Objective:   Physical Exam BP 124/70    Pulse 98    Ht 5\' 4"  (1.626 m)    Wt 223 lb (101.2 kg)    BMI 38.28 kg/m  Pleasant obese middle-aged black woman in no acute distress Eyes are anicteric Lungs are clear Heart sounds are normal S1-S2 no rubs murmurs or gallops The abdomen is soft and nontender without organomegaly or mass and bowel sounds are present  Patti Martinique, CMA present for rectal exam   Inspection as per these photos      Attempted digital exam, there is an anal stricture it is very tender and I did not proceed further due to the tenderness and pain.

## 2021-11-22 NOTE — Patient Instructions (Signed)
You have been scheduled for a colonoscopy. Please follow written instructions given to you at your visit today.  Please pick up your prep supplies at the pharmacy within the next 1-3 days. If you use inhalers (even only as needed), please bring them with you on the day of your procedure.  If you are age 42 or older, your body mass index should be between 23-30. Your Body mass index is 38.28 kg/m. If this is out of the aforementioned range listed, please consider follow up with your Primary Care Provider.  If you are age 82 or younger, your body mass index should be between 19-25. Your Body mass index is 38.28 kg/m. If this is out of the aformentioned range listed, please consider follow up with your Primary Care Provider.   ________________________________________________________  The [facility] providers would like to encourage you to use [facility] to communicate with providers for non-urgent requests or questions.  Due to long hold times on the telephone, sending your provider a message by [facility] may be a faster and more efficient way to get a response.  Please allow 48 business hours for a response.  Please remember that this is for non-urgent requests.  _______________________________________________________  Your provider has requested that you go to the basement level for lab work before leaving today. Press "B" on the elevator. The lab is located at the first door on the left as you exit the elevator.  Due to recent changes in healthcare laws, you may see the results of your imaging and laboratory studies on MyChart before your provider has had a chance to review them.  We understand that in some cases there may be results that are confusing or concerning to you. Not all laboratory results come back in the same time frame and the provider may be waiting for multiple results in order to interpret others.  Please give [place] 48 hours in order for your provider to thoroughly review all the results  before contacting the office for clarification of your results.   We have provided you with a rx for Gerhardt's Butt Creme to get.  I appreciate the opportunity to care for you. Stan Head, MD, [facility]

## 2021-11-23 ENCOUNTER — Other Ambulatory Visit: Payer: Self-pay

## 2021-11-23 ENCOUNTER — Telehealth: Payer: Self-pay | Admitting: Internal Medicine

## 2021-11-23 ENCOUNTER — Encounter: Payer: Self-pay | Admitting: Internal Medicine

## 2021-11-23 ENCOUNTER — Other Ambulatory Visit: Payer: 59

## 2021-11-23 ENCOUNTER — Ambulatory Visit (AMBULATORY_SURGERY_CENTER): Payer: 59 | Admitting: Internal Medicine

## 2021-11-23 VITALS — BP 121/76 | HR 99 | Temp 98.2°F | Resp 18 | Ht 64.0 in | Wt 223.0 lb

## 2021-11-23 DIAGNOSIS — K633 Ulcer of intestine: Secondary | ICD-10-CM | POA: Diagnosis not present

## 2021-11-23 DIAGNOSIS — K50111 Crohn's disease of large intestine with rectal bleeding: Secondary | ICD-10-CM

## 2021-11-23 DIAGNOSIS — K5120 Ulcerative (chronic) proctitis without complications: Secondary | ICD-10-CM

## 2021-11-23 DIAGNOSIS — K50119 Crohn's disease of large intestine with unspecified complications: Secondary | ICD-10-CM | POA: Diagnosis present

## 2021-11-23 DIAGNOSIS — K629 Disease of anus and rectum, unspecified: Secondary | ICD-10-CM | POA: Diagnosis not present

## 2021-11-23 DIAGNOSIS — K6289 Other specified diseases of anus and rectum: Secondary | ICD-10-CM

## 2021-11-23 DIAGNOSIS — K529 Noninfective gastroenteritis and colitis, unspecified: Secondary | ICD-10-CM

## 2021-11-23 HISTORY — DX: Ulcerative (chronic) proctitis without complications: K51.20

## 2021-11-23 MED ORDER — SODIUM CHLORIDE 0.9 % IV SOLN: 500.0000 mL | Freq: Once | INTRAVENOUS | Status: DC

## 2021-11-23 NOTE — Progress Notes (Signed)
Called to room to assist during endoscopic procedure.  Patient ID and intended procedure confirmed with present staff. Received instructions for my participation in the procedure from the performing physician.  

## 2021-11-23 NOTE — Telephone Encounter (Signed)
I spoke with Nancy Reeves and her current temp is 99 taken orally under her tongue. No covid symptoms per patient. Per Dr Hilarie Fredrickson she can come to her colonoscopy appointment today. I failed to give her a work note yesterday so I will put one at [facility] check in for her to pick up today.

## 2021-11-23 NOTE — Progress Notes (Signed)
Patient presenting for colonoscopy to evaluate perianal pain and proctitis seen by imaging Very likely perianal Crohn's disease Seen by Dr. Carlean Purl yesterday Remains appropriate for colonoscopy today

## 2021-11-23 NOTE — Progress Notes (Signed)
Pt's states no medical or surgical changes since previsit or office visit. 

## 2021-11-23 NOTE — Progress Notes (Signed)
Report to PACU, RN, vss, BBS= Clear.  

## 2021-11-23 NOTE — Op Note (Signed)
[place] Patient Name: Nancy Reeves Procedure Date: 11/23/2021 1:59 PM MRN: [id number] Endoscopist: Jerene Bears , MD Age: 41 Referring MD:  Date of Birth: 1981/02/12 Gender: Female Account #: [id number] Procedure:                Colonoscopy Indications:              Rectal bleeding, Abnormal CT of the GI tract                            revealing proctitis, perianal pain, hemorrhoid                            surgery in 2020 with chronic active inflammation                            with granulomas on pathology results -- concern for                            perianal Crohn's Medicines:                Monitored Anesthesia Care Procedure:                Pre-Anesthesia Assessment:                           - Prior to the procedure, a History and Physical                            was performed, and patient medications and                            allergies were reviewed. The patient's tolerance of                            previous anesthesia was also reviewed. The risks                            and benefits of the procedure and the sedation                            options and risks were discussed with the patient.                            All questions were answered, and informed consent                            was obtained. Prior Anticoagulants: The patient has                            taken no previous anticoagulant or antiplatelet                            agents. ASA Grade Assessment: II - A patient with  mild systemic disease. After reviewing the risks                            and benefits, the patient was deemed in                            satisfactory condition to undergo the procedure.                           After obtaining informed consent, the colonoscope                            was passed under direct vision. Throughout the                            procedure, the patient's blood pressure, pulse, and                             oxygen saturations were monitored continuously. The                            PCF-HQ190L Colonoscope was introduced through the                            anus and advanced to the terminal ileum. The                            colonoscopy was performed without difficulty. The                            patient tolerated the procedure well. The quality                            of the bowel preparation was good. The terminal                            ileum, ileocecal valve, appendiceal orifice, and                            rectum were photographed. Scope In: 2:05:10 PM Scope Out: 2:21:37 PM Scope Withdrawal Time: 0 hours 11 minutes 16 seconds  Total Procedure Duration: 0 hours 16 minutes 27 seconds  Findings:                 The perianal exam findings include skin tags,                            ulcerated mucosa in the perianal skin and extending                            up the intragluteal cleft. DRE reveals firm nodular                            tissue without stenosis.  The terminal ileum appeared normal.                           Severe inflammation characterized by congestion                            (edema), erosions, erythema, friability,                            granularity, mucus, pseudopolyps and deep                            ulcerations was found at the anus and in the distal                            rectum. In the more proximal rectum, in the                            recto-sigmoid colon, in the sigmoid colon and in                            the distal descending colon the inflammation is                            mild (erythema, granularity). The cecum, ascending                            colon, transverse colon and proximal descending                            colon is spared. Biopsies were taken with a cold                            forceps for histology (Jar 1 =                             cecum/ascending/transverse colon; Jar 2 =                            descending/sigmoid colon; Jar 3 = rectum).                           Retroflexed views show as described above. Complications:            No immediate complications. Estimated Blood Loss:     Estimated blood loss was minimal. Impression:               - Perianal skin tags and inflammatory changes as                            above found on perianal exam. Secondary to Crohn's                            disease.                           -  The examined portion of the ileum was normal.                           - Severe inflammation was found at the anus and in                            the rectum. Mild inflammation in the distal                            descending colon. Normal cecum, ascending and                            transverse colon. Consistent with Crohn's                            proctosigmoiditis and perianal Crohn's. Biopsied. Recommendation:           - Patient has a contact number available for                            emergencies. The signs and symptoms of potential                            delayed complications were discussed with the                            patient. Return to normal activities tomorrow.                            Written discharge instructions were provided to the                            patient.                           - Resume previous diet.                           - Continue present medications.                           - Await pathology results.                           - Quant Gold and hepatitis serologies today.                           - Expect initiation of infliximab.                           - Office follow-up with me in 4-8 weeks. Jerene Bears, MD 11/23/2021 2:32:55 PM This report has been signed electronically.

## 2021-11-23 NOTE — Telephone Encounter (Signed)
Patient called wanting to speak with you, wanting to know if it was normal having a fever the of the prep. Sounded like she was upset. Please advise.

## 2021-11-23 NOTE — Patient Instructions (Addendum)
Await pathology report.  Resume previous medication and diet.   Follow up in the office in 4-8 weeks    YOU HAD AN ENDOSCOPIC PROCEDURE TODAY AT THE [facility]:   Refer to the procedure report that was given to you for any specific questions about what was found during the examination.  If the procedure report does not answer your questions, please call your gastroenterologist to clarify.  If you requested that your care partner not be given the details of your procedure findings, then the procedure report has been included in a sealed envelope for you to review at your convenience later.  YOU SHOULD EXPECT: Some feelings of bloating in the abdomen. Passage of more gas than usual.  Walking can help get rid of the air that was put into your GI tract during the procedure and reduce the bloating. If you had a lower endoscopy (such as a colonoscopy or flexible sigmoidoscopy) you may notice spotting of blood in your stool or on the toilet paper. If you underwent a bowel prep for your procedure, you may not have a normal bowel movement for a few days.  Please Note:  You might notice some irritation and congestion in your nose or some drainage.  This is from the oxygen used during your procedure.  There is no need for concern and it should clear up in a day or so.  SYMPTOMS TO REPORT IMMEDIATELY:  Following lower endoscopy (colonoscopy or flexible sigmoidoscopy):  Excessive amounts of blood in the stool  Significant tenderness or worsening of abdominal pains  Swelling of the abdomen that is new, acute  Fever of 100F or higher   For urgent or emergent issues, a gastroenterologist can be reached at any hour by calling [phone]. Do not use MyChart messaging for urgent concerns.    DIET:  We do recommend a small meal at first, but then you may proceed to your regular diet.  Drink plenty of fluids but you should avoid alcoholic beverages for 24 hours.  ACTIVITY:  You should plan  to take it easy for the rest of today and you should NOT DRIVE or use heavy machinery until tomorrow (because of the sedation medicines used during the test).    FOLLOW UP: Our staff will call the number listed on your records 48-72 hours following your procedure to check on you and address any questions or concerns that you may have regarding the information given to you following your procedure. If we do not reach you, we will leave a message.  We will attempt to reach you two times.  During this call, we will ask if you have developed any symptoms of COVID 19. If you develop any symptoms (ie: fever, flu-like symptoms, shortness of breath, cough etc.) before then, please call [phone].  If you test positive for Covid 19 in the 2 weeks post procedure, please call and report this information to [place].    If any biopsies were taken you will be contacted by phone or by letter within the next 1-3 weeks.  Please call us at [phone] if you have not heard about the biopsies in 3 weeks.    SIGNATURES/CONFIDENTIALITY: You and/or your care partner have signed paperwork which will be entered into your electronic medical record.  These signatures attest to the fact that that the information above on your After Visit Summary has been reviewed and is understood.  Full responsibility of the confidentiality of this discharge information lies with you  and/or your care-partner.

## 2021-11-26 ENCOUNTER — Telehealth: Payer: Self-pay | Admitting: Internal Medicine

## 2021-11-26 DIAGNOSIS — R7303 Prediabetes: Secondary | ICD-10-CM | POA: Diagnosis not present

## 2021-11-26 DIAGNOSIS — E559 Vitamin D deficiency, unspecified: Secondary | ICD-10-CM | POA: Diagnosis not present

## 2021-11-26 DIAGNOSIS — Z6841 Body Mass Index (BMI) 40.0 and over, adult: Secondary | ICD-10-CM | POA: Diagnosis not present

## 2021-11-26 NOTE — Telephone Encounter (Signed)
Patient called and stated that she has been feeling nauseous for the last few days, seeking advice if there is anything she can take to help. Please advise.

## 2021-11-27 ENCOUNTER — Other Ambulatory Visit: Payer: Self-pay

## 2021-11-27 ENCOUNTER — Other Ambulatory Visit (HOSPITAL_BASED_OUTPATIENT_CLINIC_OR_DEPARTMENT_OTHER): Payer: Self-pay

## 2021-11-27 ENCOUNTER — Telehealth: Payer: Self-pay

## 2021-11-27 DIAGNOSIS — R110 Nausea: Secondary | ICD-10-CM

## 2021-11-27 MED ORDER — ONDANSETRON HCL 4 MG PO TABS: 4.0000 mg | ORAL_TABLET | Freq: Four times a day (QID) | ORAL | 0 refills | Status: DC | PRN

## 2021-11-27 MED ORDER — ONDANSETRON HCL 4 MG PO TABS
4.0000 mg | ORAL_TABLET | Freq: Four times a day (QID) | ORAL | 0 refills | Status: DC | PRN
  Filled 2021-11-27: qty 30, 8d supply, fill #0

## 2021-11-27 NOTE — Telephone Encounter (Signed)
Left message on answering machine. 

## 2021-11-27 NOTE — Telephone Encounter (Signed)
Ondansetron 4 mg 1 po every 6 hrs prn nausea # 30 no refill  Am ccing Dr. Hilarie Fredrickson who is her continuity gastroenterologist

## 2021-11-27 NOTE — Telephone Encounter (Signed)
Pt states that she has been having nausea since her Colonoscopy on 11/23/2021: Pt states that she does NOT have any abdominal pain. NO vomiting. Pt states that she is also still having rectum pain that seem to be a chronic issue as pt stated that she has had this for 2 years.  Please advise.

## 2021-11-27 NOTE — Telephone Encounter (Signed)
Prescription sent to pharmacy: Left message for pt to call back   

## 2021-11-27 NOTE — Telephone Encounter (Signed)
Prescription sent to pharmacy Pt made aware Pt verbalized understanding with all questions answered.   

## 2021-11-30 ENCOUNTER — Other Ambulatory Visit: Payer: Self-pay | Admitting: Internal Medicine

## 2021-11-30 ENCOUNTER — Other Ambulatory Visit: Payer: Self-pay

## 2021-11-30 ENCOUNTER — Telehealth: Payer: Self-pay | Admitting: Internal Medicine

## 2021-11-30 ENCOUNTER — Telehealth: Payer: Self-pay | Admitting: Pharmacy Technician

## 2021-11-30 ENCOUNTER — Other Ambulatory Visit (HOSPITAL_BASED_OUTPATIENT_CLINIC_OR_DEPARTMENT_OTHER): Payer: Self-pay

## 2021-11-30 ENCOUNTER — Other Ambulatory Visit: Payer: Self-pay | Admitting: Pharmacy Technician

## 2021-11-30 DIAGNOSIS — K50111 Crohn's disease of large intestine with rectal bleeding: Secondary | ICD-10-CM | POA: Insufficient documentation

## 2021-11-30 DIAGNOSIS — K50119 Crohn's disease of large intestine with unspecified complications: Secondary | ICD-10-CM

## 2021-11-30 DIAGNOSIS — K5010 Crohn's disease of large intestine without complications: Secondary | ICD-10-CM | POA: Insufficient documentation

## 2021-11-30 MED ORDER — AZATHIOPRINE 50 MG PO TABS
50.0000 mg | ORAL_TABLET | Freq: Every day | ORAL | 3 refills | Status: DC
  Filled 2021-11-30: qty 30, 30d supply, fill #0

## 2021-11-30 MED ORDER — AZATHIOPRINE 50 MG PO TABS: 50.0000 mg | ORAL_TABLET | Freq: Every day | ORAL | 3 refills | Status: DC

## 2021-11-30 NOTE — Progress Notes (Signed)
New diagnosis left-sided and rectal Crohn's disease 5 mg/kg at 0, 2 and 6 weeks and then every 8 weeks going forward Starting infliximab and low-dose azathioprine

## 2021-11-30 NOTE — Telephone Encounter (Signed)
Patient called stated that her Immuran was sent to the wrong pharmacy and that it needs to be sent to the [facility] on [address]. Please advise.

## 2021-11-30 NOTE — Telephone Encounter (Signed)
Auth Submission: PENDING Payer: [employer]HC Medication & CPT/J Code(s) submitted: Avsola (infliximab-axxq) [id number] Route of submission (phone, fax, portal): PORTAL Auth type: Buy/Bill Units/visits requested: 5MG /KG  Reference number: [id number]  Will update once we receive a response.

## 2021-11-30 NOTE — Telephone Encounter (Signed)
Script sent to the correct pharmacy.

## 2021-11-30 NOTE — Telephone Encounter (Signed)
Yes, okay for substitution

## 2021-11-30 NOTE — Telephone Encounter (Signed)
Dr. Hilarie Fredrickson,  Auth Submission: REMICADE Payer: [employer]HC Medication & CPT/J Code(s) submitted: Avsola (infliximab-axxq) [phone] Route of submission (phone, fax, portal):   AVSOLA is the preferred medication for [facility]. Would you like to try Avsola?? Please advise.

## 2021-12-01 LAB — HEPATITIS B SURFACE ANTIBODY,QUALITATIVE: Hep B S Ab: NONREACTIVE

## 2021-12-01 LAB — QUANTIFERON-TB GOLD PLUS
Mitogen-NIL: >10 IU/mL
NIL: 0.11 IU/mL
QuantiFERON-TB Gold Plus: NEGATIVE
TB1-NIL: <0 IU/mL
TB2-NIL: <0 IU/mL

## 2021-12-01 LAB — THIOPURINE METHYLTRANSFERASE (TPMT), RBC: Thiopurine Methyltransferase, RBC: 8 nmol/hr/mL RBC — ABNORMAL LOW

## 2021-12-01 LAB — HEPATITIS C ANTIBODY
Hepatitis C Ab: NONREACTIVE
SIGNAL TO CUT-OFF: 0.04 (ref <1.00)

## 2021-12-01 LAB — HEPATITIS B SURFACE ANTIGEN: Hepatitis B Surface Ag: NONREACTIVE

## 2021-12-01 LAB — HEPATITIS B CORE ANTIBODY, TOTAL: Hep B Core Total Ab: NONREACTIVE

## 2021-12-03 ENCOUNTER — Encounter: Payer: Self-pay | Admitting: *Deleted

## 2021-12-04 ENCOUNTER — Ambulatory Visit (INDEPENDENT_AMBULATORY_CARE_PROVIDER_SITE_OTHER): Payer: 59 | Admitting: Internal Medicine

## 2021-12-04 DIAGNOSIS — Z23 Encounter for immunization: Secondary | ICD-10-CM

## 2021-12-04 NOTE — Progress Notes (Signed)
Pt arrived today for first Hep A and Hep B vaccines. Pt tolerated both vaccines well. No complications noted. Pt was provided vaccine information. Second Hep B is scheduled for 01/08/22. Pt knows that she will need to return in 6 months for second hep b. Pt verbalized understanding and had no other concerns.

## 2021-12-05 NOTE — Telephone Encounter (Signed)
Dr. Hilarie Fredrickson,  Juluis Rainier note:  Auth Submission: APPROVED Payer: UHC Medication & CPT/J Code(s) submitted: Avsola (infliximab-axxq) [phone] Route of submission (phone, fax, portal): PORTAL Auth type: Buy/Bill Units/visits requested: 7 VISITS Reference number: [id number] Approval from: 11/30/21 to 05/30/22   Patient will be scheduled as soon as possible.

## 2021-12-07 DIAGNOSIS — Z1231 Encounter for screening mammogram for malignant neoplasm of breast: Secondary | ICD-10-CM | POA: Diagnosis not present

## 2021-12-07 DIAGNOSIS — K219 Gastro-esophageal reflux disease without esophagitis: Secondary | ICD-10-CM | POA: Diagnosis not present

## 2021-12-07 DIAGNOSIS — Z23 Encounter for immunization: Secondary | ICD-10-CM | POA: Diagnosis not present

## 2021-12-07 DIAGNOSIS — E559 Vitamin D deficiency, unspecified: Secondary | ICD-10-CM | POA: Diagnosis not present

## 2021-12-13 ENCOUNTER — Other Ambulatory Visit: Payer: Self-pay | Admitting: Internal Medicine

## 2021-12-13 ENCOUNTER — Other Ambulatory Visit: Payer: Self-pay

## 2021-12-13 ENCOUNTER — Telehealth: Payer: Self-pay | Admitting: Internal Medicine

## 2021-12-13 ENCOUNTER — Telehealth: Payer: Self-pay

## 2021-12-13 ENCOUNTER — Telehealth: Payer: Self-pay | Admitting: Pharmacy Technician

## 2021-12-13 DIAGNOSIS — K50113 Crohn's disease of large intestine with fistula: Secondary | ICD-10-CM

## 2021-12-13 DIAGNOSIS — K50119 Crohn's disease of large intestine with unspecified complications: Secondary | ICD-10-CM

## 2021-12-13 DIAGNOSIS — K50111 Crohn's disease of large intestine with rectal bleeding: Secondary | ICD-10-CM

## 2021-12-13 MED ORDER — HYDROCODONE-ACETAMINOPHEN 5-325 MG PO TABS: 1.0000 | ORAL_TABLET | Freq: Four times a day (QID) | ORAL | 0 refills | Status: AC | PRN

## 2021-12-13 MED ORDER — HYDROCODONE-ACETAMINOPHEN 5-325 MG PO TABS: 1.0000 | ORAL_TABLET | Freq: Four times a day (QID) | ORAL | 0 refills | Status: DC | PRN

## 2021-12-13 MED ORDER — METRONIDAZOLE 250 MG PO TABS: 250.0000 mg | ORAL_TABLET | Freq: Three times a day (TID) | ORAL | 0 refills | Status: DC

## 2021-12-13 MED ORDER — CIPROFLOXACIN HCL 500 MG PO TABS: 500.0000 mg | ORAL_TABLET | Freq: Two times a day (BID) | ORAL | 0 refills | Status: DC

## 2021-12-13 NOTE — Progress Notes (Signed)
Perianal pain due to Crohn's colitis/perianal Crohn's We are treating with antibiotics, checking an MR pelvis She has been started on azathioprine and I have contacted the infusion center to see if we can get infliximab ASAP Also hydrocodone for severe pain

## 2021-12-13 NOTE — Telephone Encounter (Signed)
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

## 2021-12-13 NOTE — Telephone Encounter (Signed)
Inbound call from patient is very upset stating hydrocodone medication was not at the pharmacy.  Please advise.

## 2021-12-13 NOTE — Telephone Encounter (Signed)
Called and spoke with patient to see which [facility] would like her prescription resent to. She was very upset as I was speaking to her about the prescription. I have contacted a [facility] that is close by to her on [address]. They checked to see if they could see the script in their system, but could not, which they said would be due to the [place] [facility] [place] being closed. They have advised me that if it was resent to them that they would be able to fill it. They also were unaware about the other location being closed. I called that patient back and advised that I talked to [facility] on [address] and that all they needed me to do was send it to them. I have advised her that I was going to have another Provider send it in since Dr. Hilarie Fredrickson has left for the day. I let her know that she would be able to pick it up this afternoon. She was appreciative and expressed understanding.

## 2021-12-13 NOTE — Progress Notes (Signed)
See additional phone note. 

## 2021-12-13 NOTE — Telephone Encounter (Signed)
Pt calling states she is at work but cannot sit down, states her rectal area hurts so she has to stand. She states the pain is bad and she is almost in tears. She is taking the Imuran. Pleas advise.

## 2021-12-13 NOTE — Telephone Encounter (Signed)
Medications sent to pt's pharmacy. Called and spoke with pt to let her know she should not take the zanaflex with cipro due to contraindication. Pt verbalized understanding. Order placed for MRI pelvis.

## 2021-12-13 NOTE — Telephone Encounter (Signed)
[facility]  Confirmation# [id number] RX# [id number] ID# [id number]  Patient will need to sign Patient Authorization Form and fax to avsola Will have patient sign form at next appt 12/14/21 Fax# [phone] PHONE: [phone]  $15,000/yr

## 2021-12-13 NOTE — Telephone Encounter (Signed)
Patient called states she is a lot of abdominal pain and is seeking help.

## 2021-12-14 ENCOUNTER — Ambulatory Visit (INDEPENDENT_AMBULATORY_CARE_PROVIDER_SITE_OTHER): Payer: 59

## 2021-12-14 ENCOUNTER — Other Ambulatory Visit: Payer: Self-pay

## 2021-12-14 VITALS — BP 128/87 | HR 102 | Temp 99.1°F | Resp 18 | Ht 64.0 in | Wt 219.0 lb

## 2021-12-14 DIAGNOSIS — K50119 Crohn's disease of large intestine with unspecified complications: Secondary | ICD-10-CM

## 2021-12-14 DIAGNOSIS — K50111 Crohn's disease of large intestine with rectal bleeding: Secondary | ICD-10-CM

## 2021-12-14 MED ORDER — EPINEPHRINE 0.3 MG/0.3ML IJ SOAJ: 0.3000 mg | Freq: Once | INTRAMUSCULAR | Status: DC | PRN

## 2021-12-14 MED ORDER — SODIUM CHLORIDE 0.9 % IV SOLN
5.0000 mg/kg | Freq: Once | INTRAVENOUS | Status: AC
  Administered 2021-12-14: 500 mg via INTRAVENOUS
  Filled 2021-12-14: qty 50

## 2021-12-14 MED ORDER — DIPHENHYDRAMINE HCL 25 MG PO CAPS
25.0000 mg | ORAL_CAPSULE | Freq: Once | ORAL | Status: AC
  Administered 2021-12-14: 25 mg via ORAL
  Filled 2021-12-14: qty 1

## 2021-12-14 MED ORDER — FAMOTIDINE IN NACL 20-0.9 MG/50ML-% IV SOLN: 20.0000 mg | Freq: Once | INTRAVENOUS | Status: DC | PRN

## 2021-12-14 MED ORDER — ACETAMINOPHEN 325 MG PO TABS
650.0000 mg | ORAL_TABLET | Freq: Once | ORAL | Status: AC
  Administered 2021-12-14: 650 mg via ORAL
  Filled 2021-12-14: qty 2

## 2021-12-14 MED ORDER — HYDROCORTISONE NA SUCCINATE PF 100 MG IJ SOLR
100.0000 mg | Freq: Once | INTRAMUSCULAR | Status: AC
  Administered 2021-12-14: 100 mg via INTRAVENOUS
  Filled 2021-12-14: qty 2

## 2021-12-14 MED ORDER — ALBUTEROL SULFATE HFA 108 (90 BASE) MCG/ACT IN AERS: 2.0000 | INHALATION_SPRAY | Freq: Once | RESPIRATORY_TRACT | Status: DC | PRN

## 2021-12-14 MED ORDER — DIPHENHYDRAMINE HCL 50 MG/ML IJ SOLN: 50.0000 mg | Freq: Once | INTRAMUSCULAR | Status: DC | PRN

## 2021-12-14 MED ORDER — SODIUM CHLORIDE 0.9 % IV SOLN: Freq: Once | INTRAVENOUS | Status: DC | PRN

## 2021-12-14 MED ORDER — METHYLPREDNISOLONE SODIUM SUCC 125 MG IJ SOLR: 125.0000 mg | Freq: Once | INTRAMUSCULAR | Status: DC | PRN

## 2021-12-14 NOTE — Progress Notes (Addendum)
Diagnosis: Crohn's Disease  Provider:  Marshell Garfinkel, MD  Procedure: Infusion  IV Type: Peripheral, IV Location: R Hand  Infliximab (Avsola), Dose: 500 mg  Infusion Start Time: 14.39 12/14/2021  Infusion Stop Time: 16.57 12/14/2021  Post Infusion IV Care: 30 minutes Observation period completed and Peripheral IV Discontinued  Discharge: Condition: Good, Destination: Home . AVS provided to patient.   Performed by:  Arnoldo Morale, RN

## 2021-12-14 NOTE — Patient Instructions (Addendum)
Infliximab injection °What is this medication? °INFLIXIMAB (in FLIX i mab) is used to treat Crohn's disease and ulcerative colitis. It is also used to treat ankylosing spondylitis, plaque psoriasis, and some forms of arthritis. °This medicine may be used for other purposes; ask your health care provider or pharmacist if you have questions. °COMMON BRAND NAME(S): AVSOLA, INFLECTRA, IXIFI, Remicade, RENFLEXIS °What should I tell my care team before I take this medication? °They need to know if you have any of these conditions: °cancer °current or past resident of [place] °diabetes °exposure to tuberculosis °Guillain-Barre syndrome °heart failure °hepatitis or liver disease °immune system problems °infection °lung or breathing disease, like COPD °multiple sclerosis °receiving phototherapy for the skin °seizure disorder °an unusual or allergic reaction to infliximab, mouse proteins, other medicines, foods, dyes, or preservatives °pregnant or trying to get pregnant °breast-feeding °How should I use this medication? °This medicine is for injection into a vein. It is usually given by a health care professional in a hospital or clinic setting. °A special MedGuide will be given to you by the pharmacist with each prescription and refill. Be sure to read this information carefully each time. °Talk to your pediatrician regarding the use of this medicine in children. While this drug may be prescribed for children as young as 6 years of age for selected conditions, precautions do apply. °Overdosage: If you think you have taken too much of this medicine contact a poison control center or emergency room at once. °NOTE: This medicine is only for you. Do not share this medicine with others. °What if I miss a dose? °It is important not to miss your dose. Call your doctor or health care professional if you are unable to keep an appointment. °What may interact with this medication? °Do not take this medicine  with any of the following medications: °biologic medicines such as abatacept, adalimumab, anakinra, certolizumab, etanercept, golimumab, rituximab, secukinumab, tocilizumab, tofactinib, ustekinumab °live vaccines °This list may not describe all possible interactions. Give your health care provider a list of all the medicines, herbs, non-prescription drugs, or dietary supplements you use. Also tell them if you smoke, drink alcohol, or use illegal drugs. Some items may interact with your medicine. °What should I watch for while using this medication? °Your condition will be monitored carefully while you are receiving this medicine. Visit your doctor or health care professional for regular checks on your progress. You may need blood work done while you are taking this medicine. Before beginning therapy, your doctor may do a test to see if you have been exposed to tuberculosis. °Call your doctor or health care professional for advice if you get a fever, chills or sore throat, or other symptoms of a cold or flu. Do not treat yourself. This drug decreases your body's ability to fight infections. Try to avoid being around people who are sick. °This medicine may make the symptoms of heart failure worse in some patients. If you notice symptoms such as increased shortness of breath or swelling of the ankles or legs, contact your health care provider right away. °If you are going to have surgery or dental work, tell your health care professional or dentist that you have received this medicine. °If you take this medicine for plaque psoriasis, stay out of the sun. If you cannot avoid being in the sun, wear protective clothing and use sunscreen. Do not use sun lamps or tanning beds/booths. °Talk to your doctor about your risk of cancer. You may   be more at risk for certain types of cancers if you take this medicine. °What side effects may I notice from receiving this medication? °Side effects that you should report to your doctor  or health care professional as soon as possible: °allergic reactions like skin rash, itching or hives, swelling of the face, lips, or tongue °breathing problems °changes in vision °chest pain °fever or chills, usually related to the infusion °joint pain °pain, tingling, numbness in the hands or feet °redness, blistering, peeling or loosening of the skin, including inside the mouth °seizures °signs of infection - fever or chills, cough, sore throat, flu-like symptoms, pain or difficulty passing urine °signs and symptoms of liver injury like dark yellow or brown urine; general ill feeling; light-colored stools; loss of appetite; nausea; right upper belly pain; unusually weak or tired; yellowing of the eyes or skin °signs and symptoms of a stroke like changes in vision; confusion; trouble speaking or understanding; severe headaches; sudden numbness or weakness of the face, arm or leg; trouble walking; dizziness; loss of balance or coordination °swelling of the ankles, feet, or hands °swollen lymph nodes in the neck, underarm, or groin areas °unusual bleeding or bruising °unusually weak or tired °Side effects that usually do not require medical attention (report to your doctor or health care professional if they continue or are bothersome): °headache °nausea °stomach pain °upset stomach °This list may not describe all possible side effects. Call your doctor for medical advice about side effects. You may report side effects to FDA at 1-800-FDA-1088. °Where should I keep my medication? °This drug is given in a hospital or clinic and will not be stored at home. °NOTE: This sheet is a summary. It may not cover all possible information. If you have questions about this medicine, talk to your doctor, pharmacist, or health care provider. °© 2022 Elsevier/Gold Standard (2016-12-04 00:00:00) ° °

## 2021-12-17 ENCOUNTER — Other Ambulatory Visit: Payer: Self-pay | Admitting: Pharmacy Technician

## 2021-12-21 ENCOUNTER — Ambulatory Visit (HOSPITAL_COMMUNITY)
Admission: RE | Admit: 2021-12-21 | Discharge: 2021-12-21 | Disposition: A | Discharge status: Home / Self Care | Payer: 59 | Source: Ambulatory Visit | Attending: Internal Medicine | Admitting: Internal Medicine

## 2021-12-21 DIAGNOSIS — K50113 Crohn's disease of large intestine with fistula: Secondary | ICD-10-CM | POA: Insufficient documentation

## 2021-12-21 DIAGNOSIS — K50119 Crohn's disease of large intestine with unspecified complications: Secondary | ICD-10-CM | POA: Insufficient documentation

## 2021-12-21 MED ORDER — GADOBUTROL 1 MMOL/ML IV SOLN
10.0000 mL | Freq: Once | INTRAVENOUS | Status: AC | PRN
  Administered 2021-12-21: 10 mL via INTRAVENOUS

## 2021-12-24 ENCOUNTER — Other Ambulatory Visit (INDEPENDENT_AMBULATORY_CARE_PROVIDER_SITE_OTHER): Payer: 59

## 2021-12-24 DIAGNOSIS — K50111 Crohn's disease of large intestine with rectal bleeding: Secondary | ICD-10-CM

## 2021-12-24 LAB — CBC WITH DIFFERENTIAL/PLATELET
Basophils Absolute: 0.1 10*3/uL (ref 0.0–0.1)
Basophils Relative: 0.8 % (ref 0.0–3.0)
Eosinophils Absolute: 0.5 10*3/uL (ref 0.0–0.7)
Eosinophils Relative: 5.4 % — ABNORMAL HIGH (ref 0.0–5.0)
HCT: 32.5 % — ABNORMAL LOW (ref 36.0–46.0)
Hemoglobin: 10 g/dL — ABNORMAL LOW (ref 12.0–15.0)
Lymphocytes Relative: 28.7 % (ref 12.0–46.0)
Lymphs Abs: 2.6 10*3/uL (ref 0.7–4.0)
MCHC: 30.7 g/dL (ref 30.0–36.0)
MCV: 66.7 fl — ABNORMAL LOW (ref 78.0–100.0)
Monocytes Absolute: 0.8 10*3/uL (ref 0.1–1.0)
Monocytes Relative: 8.7 % (ref 3.0–12.0)
Neutro Abs: 5.2 10*3/uL (ref 1.4–7.7)
Neutrophils Relative %: 56.4 % (ref 43.0–77.0)
Platelets: 401 10*3/uL — ABNORMAL HIGH (ref 150.0–400.0)
RBC: 4.87 Mil/uL (ref 3.87–5.11)
RDW: 18.9 % — ABNORMAL HIGH (ref 11.5–15.5)
WBC: 9.2 10*3/uL (ref 4.0–10.5)

## 2021-12-24 LAB — COMPREHENSIVE METABOLIC PANEL
ALT: 13 U/L (ref 0–35)
AST: 19 U/L (ref 0–37)
Albumin: 3.9 g/dL (ref 3.5–5.2)
Alkaline Phosphatase: 42 U/L (ref 39–117)
BUN: 8 mg/dL (ref 6–23)
CO2: 28 mEq/L (ref 19–32)
Calcium: 9.6 mg/dL (ref 8.4–10.5)
Chloride: 102 mEq/L (ref 96–112)
Creatinine, Ser: 0.81 mg/dL (ref 0.40–1.20)
GFR: 90.34 mL/min (ref >60.00)
Glucose, Bld: 110 mg/dL — ABNORMAL HIGH (ref 70–99)
Potassium: 4 mEq/L (ref 3.5–5.1)
Sodium: 136 mEq/L (ref 135–145)
Total Bilirubin: 0.3 mg/dL (ref 0.2–1.2)
Total Protein: 8.6 g/dL — ABNORMAL HIGH (ref 6.0–8.3)

## 2021-12-25 ENCOUNTER — Other Ambulatory Visit: Payer: Self-pay

## 2021-12-26 ENCOUNTER — Ambulatory Visit: Payer: 59 | Admitting: Internal Medicine

## 2021-12-26 ENCOUNTER — Encounter: Payer: Self-pay | Admitting: Internal Medicine

## 2021-12-26 VITALS — BP 118/80 | HR 98 | Ht 64.0 in | Wt 218.0 lb

## 2021-12-26 DIAGNOSIS — K50119 Crohn's disease of large intestine with unspecified complications: Secondary | ICD-10-CM | POA: Diagnosis not present

## 2021-12-26 DIAGNOSIS — R110 Nausea: Secondary | ICD-10-CM

## 2021-12-26 DIAGNOSIS — K50111 Crohn's disease of large intestine with rectal bleeding: Secondary | ICD-10-CM | POA: Diagnosis not present

## 2021-12-26 DIAGNOSIS — K219 Gastro-esophageal reflux disease without esophagitis: Secondary | ICD-10-CM

## 2021-12-26 MED ORDER — ONDANSETRON HCL 4 MG PO TABS: 4.0000 mg | ORAL_TABLET | Freq: Four times a day (QID) | ORAL | 2 refills | Status: DC | PRN

## 2021-12-26 NOTE — Patient Instructions (Addendum)
Continue Azathioprine as directed.   Continue Famotidine.   We have sent the following medications to your pharmacy for you to pick up at your convenience: Zofran   You may use Miralax as needed.   Follow-up in 3 months. Office to call with an appointment.   If you are age 42 or younger, your body mass index should be between 19-25. Your Body mass index is 37.42 kg/m. If this is out of the aformentioned range listed, please consider follow up with your Primary Care Provider.   ________________________________________________________  The [facility] providers would like to encourage you to use [facility] to communicate with providers for non-urgent requests or questions.  Due to long hold times on the telephone, sending your provider a message by [facility] may be a faster and more efficient way to get a response.  Please allow 48 business hours for a response.  Please remember that this is for non-urgent requests.  _______________________________________________________  Thank you for choosing me and [facility].  Dr.Jay Pyrtle

## 2021-12-28 ENCOUNTER — Encounter: Payer: Self-pay | Admitting: Internal Medicine

## 2021-12-28 ENCOUNTER — Other Ambulatory Visit: Payer: Self-pay

## 2021-12-28 ENCOUNTER — Ambulatory Visit (INDEPENDENT_AMBULATORY_CARE_PROVIDER_SITE_OTHER): Payer: 59

## 2021-12-28 VITALS — BP 133/83 | HR 96 | Temp 98.8°F | Resp 18 | Ht 64.0 in | Wt 217.6 lb

## 2021-12-28 DIAGNOSIS — K50111 Crohn's disease of large intestine with rectal bleeding: Secondary | ICD-10-CM

## 2021-12-28 MED ORDER — EPINEPHRINE 0.3 MG/0.3ML IJ SOAJ: 0.3000 mg | Freq: Once | INTRAMUSCULAR | Status: DC | PRN

## 2021-12-28 MED ORDER — FAMOTIDINE IN NACL 20-0.9 MG/50ML-% IV SOLN: 20.0000 mg | Freq: Once | INTRAVENOUS | Status: DC | PRN

## 2021-12-28 MED ORDER — DIPHENHYDRAMINE HCL 25 MG PO CAPS
25.0000 mg | ORAL_CAPSULE | Freq: Once | ORAL | Status: AC
  Administered 2021-12-28: 25 mg via ORAL
  Filled 2021-12-28: qty 1

## 2021-12-28 MED ORDER — SODIUM CHLORIDE 0.9 % IV SOLN: Freq: Once | INTRAVENOUS | Status: DC | PRN

## 2021-12-28 MED ORDER — ACETAMINOPHEN 325 MG PO TABS
650.0000 mg | ORAL_TABLET | Freq: Once | ORAL | Status: AC
  Administered 2021-12-28: 650 mg via ORAL
  Filled 2021-12-28: qty 2

## 2021-12-28 MED ORDER — ALBUTEROL SULFATE HFA 108 (90 BASE) MCG/ACT IN AERS: 2.0000 | INHALATION_SPRAY | Freq: Once | RESPIRATORY_TRACT | Status: DC | PRN

## 2021-12-28 MED ORDER — SODIUM CHLORIDE 0.9 % IV SOLN
5.0000 mg/kg | Freq: Once | INTRAVENOUS | Status: AC
  Administered 2021-12-28: 500 mg via INTRAVENOUS
  Filled 2021-12-28: qty 50

## 2021-12-28 MED ORDER — HYDROCORTISONE NA SUCCINATE PF 100 MG IJ SOLR
100.0000 mg | Freq: Once | INTRAMUSCULAR | Status: AC
  Administered 2021-12-28: 100 mg via INTRAVENOUS
  Filled 2021-12-28: qty 2

## 2021-12-28 MED ORDER — METHYLPREDNISOLONE SODIUM SUCC 125 MG IJ SOLR: 125.0000 mg | Freq: Once | INTRAMUSCULAR | Status: DC | PRN

## 2021-12-28 MED ORDER — DIPHENHYDRAMINE HCL 50 MG/ML IJ SOLN: 50.0000 mg | Freq: Once | INTRAMUSCULAR | Status: DC | PRN

## 2021-12-28 NOTE — Progress Notes (Signed)
Diagnosis: Crohn's Disease  Provider:  Marshell Garfinkel, MD  Procedure: Infusion  IV Type: Peripheral, IV Location: L Antecubital  Avsola, Dose: 500 mg  Infusion Start Time: 1056am  Infusion Stop Time: 1325pm  Post Infusion IV Care: Peripheral IV Discontinued  Discharge: Condition: Good, Destination: Home . AVS provided to patient.   Performed by:  Koren Shiver, RN

## 2021-12-28 NOTE — Progress Notes (Signed)
Subjective:    Patient ID: Nancy Reeves, female    DOB: [birth year], 40 y.o.   MRN: [id number]  HPI Nancy Reeves is a 41 year old female with newly confirmed Crohn's of the left colon, rectum and perianal skin with history of fistula, fibromyalgia, pseudotumor cerebri who is here for follow-up.  She is here alone today and was last seen for colonoscopy on 11/23/2021.  Colonoscopy revealed skin tags and ulcerated mucosa in the perianal skin.  The ileum was normal.  There was severe inflammation in the anal canal and distal rectum.  From the more proximal rectum to the distal descending colon there was mild colitis.  Biopsies confirmed chronic active colitis consistent with IBD.  She was having significant pelvic and rectal pain.  We recently started ciprofloxacin and metronidazole.  She is about 7 days into a 10-day course.  She stopped the Cipro due to muscle cramping but has remained on metronidazole.  First infliximab infusion was well-tolerated on 12/14/2021.  She is feeling dramatically better.  She is having nausea every few days for which she is using ondansetron.  Much less abdominal pain.  Less rectal bleeding with bowel movement.  Famotidine 40 mg in the evening is working well to control nocturnal reflux and heartburn.  She started to advance her diet and including more fiber.   Review of Systems As per HPI, otherwise negative  Current Medications, Allergies, Past Medical History, Past Surgical History, Family History and Social History were reviewed in [employer] record.    Objective:   Physical Exam BP 118/80    Pulse 98    Ht 5\' 4"  (1.626 m)    Wt 218 lb (98.9 kg)    SpO2 96%    BMI 37.42 kg/m  Gen: awake, alert, NAD HEENT: anicteric  CV: RRR, no mrg Pulm: CTA b/l Abd: soft, NT/ND, +BS throughout Ext: no c/c/e Neuro: nonfocal  MRI PELVIS WITHOUT AND WITH CONTRAST   TECHNIQUE: Multiplanar multisequence MR imaging of the pelvis was  performed both before and after administration of intravenous contrast.   CONTRAST:  57mL GADAVIST GADOBUTROL 1 MMOL/ML IV SOLN   COMPARISON:  Pelvis CT on 11/01/2021   FINDINGS: Lower Urinary Tract: Unremarkable urinary bladder.   Bowel: Mild diffuse rectal wall thickening is seen, suspicious for proctitis. Linear area of soft tissue enhancement with tiny air bubble is seen extending from the inferior margin of the anal canal to the gluteal crease. No secondary tracts or abscess identified.   Vascular/Lymphatic: Multiple small left-sided perirectal lymph nodes are seen, largest measuring 12 mm on image 3/9. These are likely reactive in etiology.   Reproductive:  No mass or other significant abnormality.   Other: None.   Musculoskeletal: No significant abnormality identified.   IMPRESSION: Mild diffuse rectal wall thickening, consistent with proctitis.   Linear area of soft tissue enhancement with tiny air bubble extending from the inferior margin of the anal canal to the gluteal crease, suspicious for simple intersphincteric fistula. No secondary tracts or abscess identified.   Multiple small left-sided perirectal lymph nodes, largest measuring 12 mm, and likely reactive in etiology.     Electronically Signed   By: Marlaine Hind M.D.   On: 12/24/2021 12:51 CMP     Component Value Date/Time   NA 136 12/24/2021 1605   K 4.0 12/24/2021 1605   CL 102 12/24/2021 1605   CO2 28 12/24/2021 1605   GLUCOSE 110 (H) 12/24/2021 1605   BUN 8 12/24/2021 1605  CREATININE 0.81 12/24/2021 1605   CALCIUM 9.6 12/24/2021 1605   PROT 8.6 (H) 12/24/2021 1605   ALBUMIN 3.9 12/24/2021 1605   AST 19 12/24/2021 1605   ALT 13 12/24/2021 1605   ALKPHOS 42 12/24/2021 1605   BILITOT 0.3 12/24/2021 1605   GFRNONAA >60 11/01/2021 0155   GFRAA >60 08/18/2019 0530   CBC    Component Value Date/Time   WBC 9.2 12/24/2021 1605   RBC 4.87 12/24/2021 1605   HGB 10.0 (L) 12/24/2021 1605    HCT 32.5 (L) 12/24/2021 1605   PLT 401.0 (H) 12/24/2021 1605   MCV 66.7 (L) 12/24/2021 1605   MCH 20.9 (L) 11/01/2021 0155   MCHC 30.7 12/24/2021 1605   RDW 18.9 (H) 12/24/2021 1605   LYMPHSABS 2.6 12/24/2021 1605   MONOABS 0.8 12/24/2021 1605   EOSABS 0.5 12/24/2021 1605   BASOSABS 0.1 12/24/2021 1605        Assessment & Plan:  41 year old female with newly confirmed Crohn's of the left colon, rectum and perianal skin with history of fistula, fibromyalgia, pseudotumor cerebri who is here for follow-up.   Crohn's colitis with perianal involvement/noncomplicated perianal fistula --she is feeling already significantly better after 8 days of antibiotics and her initial infliximab infusion.  She is also tolerating azathioprine 50 mg daily.  We discussed the diagnosis and the likelihood of ongoing and continued mucosal healing.  Plan as follows: --Continue infliximab as scheduled, her second infusion and loading dose is 12/28/2021; when she completes the induction she will continue therapy every 8 weeks --Continue azathioprine 50 mg daily; no evidence of leukopenia or hepatitis --Okay to stop antibiotics at this time --Okay to use MiraLAX 17 g if needed should she feel constipated as her colitis heals  2.  GERD --predominantly nocturnal symptoms responding well to famotidine 40 mg at bedtime.  Continue current dose  3.  Nausea --intermittent but improved, ondansetron 4 mg every 8 hours as needed for nausea  Follow-up with me in 3 to 4 months  30 minutes total spent today including patient facing time, coordination of care, reviewing medical history/procedures/pertinent radiology studies, and documentation of the encounter.

## 2022-01-08 ENCOUNTER — Telehealth: Payer: Self-pay | Admitting: Internal Medicine

## 2022-01-08 ENCOUNTER — Ambulatory Visit (INDEPENDENT_AMBULATORY_CARE_PROVIDER_SITE_OTHER): Payer: 59 | Admitting: Internal Medicine

## 2022-01-08 DIAGNOSIS — Z23 Encounter for immunization: Secondary | ICD-10-CM

## 2022-01-08 NOTE — Telephone Encounter (Signed)
Patient called requesting to speak with the nurse.  She states she can't go to the bathroom.  She says she has been constipated so long she feels there is a blockage and she can't push it out.  She has the urge to go, but just can't.  She says this is new for her.  Please call patient and advise.

## 2022-01-08 NOTE — Telephone Encounter (Signed)
Pt reports she has been taking fiber and dulcolax. She has hard stool she can feel but cannot pass it, reports it is quite painful. Discussed with pt that she may have an impaction which would require her to be seen in the ER to have the impaction removed. Also discussed with pt that she could try an enema to see if that may help break up the hard stool. Pt verbalized understanding.

## 2022-01-15 ENCOUNTER — Telehealth: Payer: Self-pay | Admitting: Internal Medicine

## 2022-01-15 ENCOUNTER — Telehealth: Payer: Self-pay | Admitting: *Deleted

## 2022-01-15 NOTE — Telephone Encounter (Signed)
Pt stopped by at the office before closing yesterday. She dropped of disability forms and filled out intake and release of information forms for Dr. Hilarie Fredrickson. She would like to pick up forms once they are filled out as she prefers to hand them in to her employer. All forms will be given to [facility].

## 2022-01-15 NOTE — Telephone Encounter (Signed)
Forms placed on Dr Vena Rua desk for review.

## 2022-01-15 NOTE — Telephone Encounter (Signed)
Patient has requested a work note for her employer from Thursday, 01/08/22 thru Thursday, 01/17/22. I have spoken to Dr Hilarie Fredrickson and he is agreeable to giving patient this work note.   Note created and available to patient via mychart.

## 2022-01-21 ENCOUNTER — Telehealth: Payer: Self-pay | Admitting: Internal Medicine

## 2022-01-21 NOTE — Telephone Encounter (Signed)
Patient needs an updated excuse note starting 3/3 - 3/17 for two weeks out.  She wants to make sure she has enough time to get in her short term disability paperwork. ? ?Her job is being very difficult and demanding that she give this to them today, if possible. ? ?Also, she wanted to know if you were still working on her short term disability paperwork. ? ?She apologizes for being so demanding, but her job is pushing her and she appreciates all you are doing to help her.  ? ?Thank you. ?

## 2022-01-21 NOTE — Telephone Encounter (Signed)
Patient called to check status on paperwork. ?

## 2022-01-22 DIAGNOSIS — Z0279 Encounter for issue of other medical certificate: Secondary | ICD-10-CM

## 2022-01-22 NOTE — Telephone Encounter (Signed)
Dr Hilarie Fredrickson- ?Ok to write note for out of work 3/3 thru 3/17? ?

## 2022-01-22 NOTE — Telephone Encounter (Signed)
Forms have been completed. Patient will come by and pay the $29.00 fee. Once fee has been collected then I will fax forms to [facility].  ?

## 2022-01-22 NOTE — Telephone Encounter (Signed)
I have spoken to Dr Hilarie Fredrickson who indicates that it is ok to give patient an additional work note for the days requested. Note made available in mychart. ?

## 2022-01-25 ENCOUNTER — Ambulatory Visit (INDEPENDENT_AMBULATORY_CARE_PROVIDER_SITE_OTHER): Payer: 59

## 2022-01-25 ENCOUNTER — Other Ambulatory Visit: Payer: Self-pay

## 2022-01-25 VITALS — BP 101/65 | HR 96 | Temp 98.7°F | Resp 16 | Ht 64.0 in | Wt 215.6 lb

## 2022-01-25 DIAGNOSIS — K50111 Crohn's disease of large intestine with rectal bleeding: Secondary | ICD-10-CM | POA: Diagnosis not present

## 2022-01-25 MED ORDER — ACETAMINOPHEN 325 MG PO TABS: 650.0000 mg | ORAL_TABLET | Freq: Once | ORAL | Status: DC

## 2022-01-25 MED ORDER — SODIUM CHLORIDE 0.9 % IV SOLN
5.0000 mg/kg | Freq: Once | INTRAVENOUS | Status: AC
  Administered 2022-01-25: 500 mg via INTRAVENOUS
  Filled 2022-01-25: qty 50

## 2022-01-25 MED ORDER — DIPHENHYDRAMINE HCL 25 MG PO CAPS
25.0000 mg | ORAL_CAPSULE | Freq: Once | ORAL | Status: AC
  Administered 2022-01-25: 25 mg via ORAL
  Filled 2022-01-25: qty 1

## 2022-01-25 MED ORDER — DIPHENHYDRAMINE HCL 50 MG/ML IJ SOLN: 50.0000 mg | Freq: Once | INTRAMUSCULAR | Status: DC | PRN

## 2022-01-25 MED ORDER — ALBUTEROL SULFATE HFA 108 (90 BASE) MCG/ACT IN AERS: 2.0000 | INHALATION_SPRAY | Freq: Once | RESPIRATORY_TRACT | Status: DC | PRN

## 2022-01-25 MED ORDER — FAMOTIDINE IN NACL 20-0.9 MG/50ML-% IV SOLN: 20.0000 mg | Freq: Once | INTRAVENOUS | Status: DC | PRN

## 2022-01-25 MED ORDER — SODIUM CHLORIDE 0.9 % IV SOLN: Freq: Once | INTRAVENOUS | Status: DC | PRN

## 2022-01-25 MED ORDER — EPINEPHRINE 0.3 MG/0.3ML IJ SOAJ: 0.3000 mg | Freq: Once | INTRAMUSCULAR | Status: DC | PRN

## 2022-01-25 MED ORDER — HYDROCORTISONE NA SUCCINATE PF 100 MG IJ SOLR
100.0000 mg | Freq: Once | INTRAMUSCULAR | Status: AC
  Administered 2022-01-25: 100 mg via INTRAVENOUS
  Filled 2022-01-25: qty 2

## 2022-01-25 MED ORDER — METHYLPREDNISOLONE SODIUM SUCC 125 MG IJ SOLR: 125.0000 mg | Freq: Once | INTRAMUSCULAR | Status: DC | PRN

## 2022-01-25 NOTE — Progress Notes (Signed)
Diagnosis: Crohn's Disease ? ?Provider:  Marshell Garfinkel, MD ? ?Procedure: Infusion ? ?IV Type: Peripheral, IV Location: L Antecubital ? ?Avsola (Infliximab-axxq), Dose: 500 mg ? ?Infusion Start Time: [id number] ? ?Infusion Stop Time: [id number] ? ?Post Infusion IV Care: Peripheral IV Discontinued ? ?Discharge: Condition: Good, Destination: Home . AVS provided to patient.  ? ?Performed by:  Koren Shiver, RN  ?  ?

## 2022-02-01 ENCOUNTER — Encounter: Payer: Self-pay | Admitting: Internal Medicine

## 2022-02-04 ENCOUNTER — Encounter: Payer: Self-pay | Admitting: Internal Medicine

## 2022-02-12 ENCOUNTER — Telehealth: Payer: Self-pay | Admitting: Internal Medicine

## 2022-02-12 NOTE — Telephone Encounter (Signed)
The pt states that she has been constipated (not new for her) and using miralax.  She asked if she can increase the dose to keep her bowels regular. She said someone told her not to do that.  I advised it is safe for her to use miralax and titrate as needed.  She will call back if she has further concerns. She was advised to make a follow up with Dr Hilarie Fredrickson in a few months.  ?

## 2022-02-18 ENCOUNTER — Encounter: Payer: Self-pay | Admitting: Internal Medicine

## 2022-02-18 NOTE — Telephone Encounter (Signed)
Letter written ?JMP ?

## 2022-02-25 ENCOUNTER — Telehealth: Payer: Self-pay | Admitting: Internal Medicine

## 2022-02-25 NOTE — Telephone Encounter (Signed)
Inbound call from patient would like to know if she can pick up the paperwork regarding her out of work instead of having it faxed to her job. ?

## 2022-02-25 NOTE — Telephone Encounter (Signed)
Dottie have you seen paperwork for leave from her job? If it has been completed she wants to pick it up instead of having it faxed to her employer. ?

## 2022-03-05 NOTE — Telephone Encounter (Signed)
Patient has been advised that paperwork is complete and has been placed at the front desk for pick up. She verbalizes understanding of this information. ?

## 2022-03-15 ENCOUNTER — Encounter: Payer: Self-pay | Admitting: Internal Medicine

## 2022-03-21 ENCOUNTER — Telehealth: Payer: Self-pay | Admitting: Pharmacy Technician

## 2022-03-21 NOTE — Telephone Encounter (Addendum)
New Insurance card: ?ID: [id number] ?GR: [id number]  ?BIN: [id number] ?Phone: [phone] ? ?New PA submitted with new insurance info. ? ?Auth Submission: denied ?Payer: BCBS ?Medication & CPT/J Code(s) submitted: Avsola (infliximab-axxq) S8098542 ?Route of submission (phone, fax, portal): COVER MY MEDS ?Auth type: Buy/Bill ?Units/visits requested: 500MG  Q8WKS ?Reference number: [id number] ? ? ?  ?

## 2022-03-22 ENCOUNTER — Ambulatory Visit: Payer: 59

## 2022-03-22 NOTE — Telephone Encounter (Signed)
Dr. Hilarie Fredrickson, ? ?Patient has new Buyer, retail with  BCBS.  Avsola has been denied. Preferred product is Remicade. ?Would you like to switch to Remicade?? ?Please advise. ?Nancy Reeves

## 2022-03-22 NOTE — Telephone Encounter (Signed)
Auth Submission: PENDING ?Payer: BCBS ?Medication & CPT/J Code(s) submitted: Remicade (Infliximab) J1745 ?Route of submission (phone, fax, portal): COVER MY MEDS ?Auth type: Buy/Bill ?Units/visits requested: 500MG  Q8WKS ?Reference number: ?Approval from:  to   ? ? ?

## 2022-03-25 NOTE — Telephone Encounter (Signed)
I spoke to [facility] today and requested Peer to peer. ?No one was available this afternoon, but said they would reach back to me tomorrow morning to set up peer to peer with the pharmacist. ?I asked that they call the office number and asked to speak to Quintin Alto who I will be working with tomorrow so that we can get peer to peer scheduled. ?We need to continue infliximab for this patient will work to get this approved in the coming days ?JMP ?

## 2022-03-25 NOTE — Telephone Encounter (Signed)
Dr. Hilarie Fredrickson, ? ?Patient has new insurance with BC[employer], and they require we try ALL 3 therapies for Avsola approval. Patient has only tried 2 of 3 therapies. ?1. Prednisone ?2. Azathioprine ?3. Mesalamine or Methotrexate. ? ?Remicade has also been denied due to not trying Avsola first. ? ?You have the option to do an appeal or Peer to Peer. ? ?BCBC:  Pharmacy Utilization Management ?Phone: [phone] ?Ref# [id number]

## 2022-03-26 NOTE — Telephone Encounter (Signed)
Cassandra from [facility] called this morning to schedule a peer-to-peer with Dr. Rhea Belton.  Karen Kitchens is not here today. To schedule peer-to-peer, please call [phone].  Thank you. ?

## 2022-03-26 NOTE — Telephone Encounter (Signed)
Peer to peer has been scheduled for 03/27/22 @ 12:30pm.  ?

## 2022-03-27 ENCOUNTER — Other Ambulatory Visit: Payer: Self-pay | Admitting: Pharmacy Technician

## 2022-03-27 NOTE — Telephone Encounter (Signed)
After peer to peer Damien Fusi approved x 1 yr ? ?Approval case # [id number] ? ? ?

## 2022-03-27 NOTE — Telephone Encounter (Signed)
Dr. Hilarie Fredrickson, ?Patient will be scheduled as soon as possible.  ?Thanks ?Kim ?

## 2022-03-28 ENCOUNTER — Telehealth: Payer: Self-pay | Admitting: *Deleted

## 2022-03-28 MED ORDER — HYDROCORTISONE ACETATE 25 MG RE SUPP
25.0000 mg | Freq: Every day | RECTAL | 0 refills | Status: DC
Start: 2022-03-28 — End: 2022-05-23

## 2022-03-28 NOTE — Telephone Encounter (Signed)
Patient states that she has been having continued gi symptoms despite Avsola infusions. She says that although her constipation has improved, she now has shooting rectal pain at various times throughout the day and has skin tags that she feels are "splitting, then healing." She describes a few instances of rectal bleeding although much smaller amount than before treatment.  ? ?Dr Rhea Belton has given verbal orders for patient to have Anusol suppositories, 1 PR QHS x 2 weeks. He has also asked that she contact the infusion center to get rescheduled for Avsola now that he has completed a peer to peer and the medication has been approved. Patient verbalizes understanding of this information. ?

## 2022-04-01 NOTE — Telephone Encounter (Signed)
Patient called stating her medication had to be submitted through [email or web address] in order for her BCBS to cover it.  She needs this done asap as she has not had her infusion and is in need.  Any questions, please call patient. ?

## 2022-04-01 NOTE — Telephone Encounter (Signed)
Patient called stating her medication had to be submitted through [email or web address] in order for her BCBS to cover it.  She needs this done asap as she has not had her infusion and is in need.  Any questions, please call patient. ? ?Nancy Reeves this pt called about her Avsola. Dr. Hilarie Fredrickson did a peer to peer and it was supposedly approved. Now pt states it has to be run through cover my meds. Can you help with this? We have not gotten anything with a key or anything to put through cover my meds. ?

## 2022-04-02 NOTE — Telephone Encounter (Signed)
Orpah Cobb and [place] ?Could you please let me know where we stand with infliximab, Avsola, which was recently approved after my peer to peer on May 4. ?I would like this scheduled asap. ?Thanks ?Lajuan Lines. Daneya Hartgrove, M.D.  04/02/2022 ? ?

## 2022-04-03 ENCOUNTER — Telehealth: Payer: Self-pay | Admitting: Pharmacy Technician

## 2022-04-03 NOTE — Telephone Encounter (Signed)
Additional disability forms have been filled out and sent to [employer]. These forms can be found under "media" tab in [employer]. ?

## 2022-04-03 NOTE — Telephone Encounter (Signed)
Auth Submission: approved ?Payer: BCBS ?Medication & CPT/J Code(s) submitted: Avsola (infliximab-axxq) [id number] ?Route of submission (phone, fax, portal): PHONE ?Auth type: Buy/Bill ?Units/visits requested: 5MG /KG ?Reference number: [id number] ?Approval from: 03/21/22 to 03/20/23  ?  ?

## 2022-04-04 ENCOUNTER — Telehealth: Payer: Self-pay | Admitting: Pharmacy Technician

## 2022-04-04 ENCOUNTER — Encounter: Payer: Self-pay | Admitting: Internal Medicine

## 2022-04-04 ENCOUNTER — Ambulatory Visit (INDEPENDENT_AMBULATORY_CARE_PROVIDER_SITE_OTHER): Payer: BC Managed Care – PPO

## 2022-04-04 VITALS — BP 101/63 | HR 98 | Temp 98.3°F | Resp 18 | Ht 64.0 in | Wt 217.0 lb

## 2022-04-04 DIAGNOSIS — K50111 Crohn's disease of large intestine with rectal bleeding: Secondary | ICD-10-CM

## 2022-04-04 MED ORDER — ACETAMINOPHEN 325 MG PO TABS
650.0000 mg | ORAL_TABLET | Freq: Once | ORAL | Status: AC
  Administered 2022-04-04: 650 mg via ORAL
  Filled 2022-04-04: qty 2

## 2022-04-04 MED ORDER — HYDROCORTISONE NA SUCCINATE PF 100 MG IJ SOLR
100.0000 mg | Freq: Once | INTRAMUSCULAR | Status: AC
  Administered 2022-04-04: 100 mg via INTRAVENOUS
  Filled 2022-04-04: qty 2

## 2022-04-04 MED ORDER — DIPHENHYDRAMINE HCL 25 MG PO CAPS
25.0000 mg | ORAL_CAPSULE | Freq: Once | ORAL | Status: AC
  Administered 2022-04-04: 25 mg via ORAL
  Filled 2022-04-04: qty 1

## 2022-04-04 MED ORDER — SODIUM CHLORIDE 0.9 % IV SOLN
500.0000 mg | Freq: Once | INTRAVENOUS | Status: AC
  Administered 2022-04-04: 500 mg via INTRAVENOUS
  Filled 2022-04-04: qty 50

## 2022-04-04 NOTE — Telephone Encounter (Signed)
Avsola co-pay card: approved  Id# [id number] BIN: [id number] GR: [id number] CARD: [id number] EXP: 3/25 CVV: 096

## 2022-04-04 NOTE — Progress Notes (Signed)
Diagnosis: Crohn's Disease  Provider:  Chilton Greathouse, MD  Procedure: Infusion  IV Type: Peripheral, IV Location: L Forearm  Avsola (Infliximab-axxq), Dose: 500 mg  Infusion Start Time: 1209  Infusion Stop Time: 1430  Post Infusion IV Care: Peripheral IV Discontinued  Discharge: Condition: Good, Destination: Home . AVS provided to patient.   Performed by:  Loney Hering, LPN

## 2022-04-05 NOTE — Progress Notes (Signed)
04/08/2022 Nancy Reeves [id number] [birth year]  Referring provider: Rebecka Apley, NP Primary GI doctor: Dr. Rhea Belton  ASSESSMENT AND PLAN:   Crohn's disease of perianal region with complication (HCC) worsening constipation and rectal pain, some chills -     CBC with Differential/Platelet; Future -     Basic metabolic panel; Future -     Hepatic function panel; Future -     High sensitivity CRP; Future -     Sedimentation rate; Future -     DG Abd 2 Views; Future Will get labs to evaluate for inflammation, continuing perianal involvement, MRI pelvis 12/2021 without abscess Can consider getting antibody and medication level prior to next infusion. Has had 5 infusions of infliximaub, last one 04/04/2022  On azathioprine 50 mg daily  Last B12 was normal, had vitamin D checked with intuitionalist, on Vitamin D 50,000 Q weekly  Microcytic anemia -     CBC with Differential/Platelet; Future -     IBC + Ferritin; Future -     Folate; Future Will likely benefit from oral iron, may benefit from IV  Rectal pain Had MRI pelvis 12/21/2021 proctitis, simple intersphincteric fistula, no abscess Has had 5 infusions of infliximaub, last one 04/04/2022  -     DG Abd 2 Views; Future -     metroNIDAZOLE (FLAGYL) 500 MG tablet; Take 1 tablet (500 mg total) by mouth 3 (three) times daily for 10 days. -     AMBULATORY NON FORMULARY MEDICATION; Medication Name: Diltiazem 2%/ Lidocaine 5% Apply pea size amount 1/2 to 1 inch inside rectum 3-4 times daily x1 month.  Discussed importance of keeping soft stools, get on miralax daily.  On examination, thickened rectum with some induration, warmth, tenderness, no fluctuation or evidence of abscess but some mild yellow discharge from right gluteal fold, unable to express more, MRI normal 02/03- will send in flagyl for 10 days  Obvious fissures, will send in diltiazem/lidocaine for patient.  Will KUB to evaluate for obstruction, free air  Chronic  idiopathic constipation - Increase fiber/ water intake, decrease caffeine, increase activity level. -Will add on Miralax daily - Please go to the hospital if you have severe abdominal pain, vomiting, fever, CP, SOB.    History of Present Illness:  41 y.o. female presents for evaluation of left-sided Crohn's perianal involvement/noncomplicated perianal fistula history of C. difficile 2020. History of pseudotumor cerebri and fibromyalgia. Last seen in the office on 12/26/2021 with Dr. Rhea Belton.   IBD history:Diagnosed 11/23/2021 after colonoscopy, biopsy showed chronic active colitis consistent with IBD Surgical history: No specific surgery however patient did have 2020 hemorrhoid surgery with Dr. Rubye Oaks at [facility]  resected external hemorrhoids, a 3 cm anal tag and performed a lateral internal sphincterotomy for anal fissure.  Current medications and last dose:  Infliximab infusion first infusion 12/14/2021-completing loading dose 12/28/2021 and will start on every 8 weeks- 03/21/2021 new insurance required PA for Avsola- peer to peer approved.  Last infusion was 04/04/2022 with bio similar Avsola- switched due to insurance.  One prior to that was 01/25/2022 Started on azathioprine 50 mg daily  Cipro and Flagyl initiated 11/23/2021, unable to tolerate Cipro due to muscle spasms.  Completed 8 days of antibiotics  Last colonoscopy: 11/23/2021, not on any medications, No skin tags, inflammatory changes secondary to Crohn's disease.  Normal terminal ileum severe inflammation anus and around the rectum mild inflammation distal descending colon.  Normal cecum ascending and transverse colon Last imaging:  11/01/2021 CT  pelvis mild rectal wall thickening perirectal inflammatory stranding suggestive of proctitis 12/21/2021 MRI pelvis with and without  Mild diffuse rectal wall thickening, consistent with proctitis.Linear area of soft tissue enhancement with tiny air bubble extending from the  inferior margin of the anal canal to the gluteal crease, suspicious for simple intersphincteric fistula. No secondary tracts or abscess identified.  Multiple small left-sided perirectal lymph nodes, largest measuring 12 mm, and likely reactive in etiology.  Other medical history: GERD predominantly nocturnal symptoms responds well to famotidine.   Nausea on as needed for ondansetron. MiraLAX as needed for constipation.  Recent labs: 11/22/2021 CRP 5.0 12/24/2021 WBC 9.2 HGB 10.0 MCV 66.7 Platelets 401.0 08/15/2019 Iron 9 Ferritin 19.3 B12 691 12/24/2021 AST 19 ALT 13 Alkphos 42 TBili 0.3 No vitamin D checked  TB GOLD 11/23/2021 NEGATIVE or TB skin if indeterminate.  HepBsAG 11/23/2021 NON-REACTIVE  TPMT Activity: 8- low metabolizer.  Current History She states she is very constipated.  She had very small BM this AM, states she has very big piece that would not come out. She does not take it consistently due to fear of becoming dependent. She she has used it for 5 days straight it has been very helpful.  Would have tightness and discomfort in perineum. Will have itching, burning, and BRB on TP.  She states she tried to have intercourse last Monday but it was painful after in the peroneum, felt little cuts with urination. No vaginal discharges since infusion.  Has not had a fever, has had some chills and nausea.  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 Short-term disability has been filled out as well as long term, given copy this visit.   Immunization History  Administered Date(s) Administered   Hepatitis A, Adult 12/04/2021   Hepb-cpg 12/04/2021, 01/08/2022   PFIZER(Purple Top)SARS-COV-2 Vaccination 06/09/2020, 06/30/2020, 12/15/2020   Pfizer Covid-19 Vaccine Bivalent Booster 88yrs & up 12/07/2021    Current Medications:   Current Outpatient Medications (Endocrine & Metabolic):    metFORMIN (GLUCOPHAGE-XR) 500 MG 24 hr tablet, Take 500 mg by  mouth at bedtime.   Current Outpatient Medications (Respiratory):    albuterol (VENTOLIN HFA) 108 (90 Base) MCG/ACT inhaler, Inhale 1 puff into the lungs every 4 (four) hours.    Current Outpatient Medications (Other):    AMBULATORY NON FORMULARY MEDICATION, Medication Name: Diltiazem 2%/ Lidocaine 5% Apply pea size amount 1/2 to 1 inch inside rectum 3-4 times daily x1 month.     *Please DO NOT go directly from our office to pick up this medication! Give the pharmacy 1 day to process the prescription as this is compounded and takes time to make.   azaTHIOprine (IMURAN) 50 MG tablet, Take 1 tablet (50 mg total) by mouth daily.   famotidine (PEPCID) 40 MG tablet, Take 40 mg by mouth daily.    hydrocortisone (ANUSOL-HC) 25 MG suppository, Place 1 suppository (25 mg total) rectally at bedtime.   metroNIDAZOLE (FLAGYL) 500 MG tablet, Take 1 tablet (500 mg total) by mouth 3 (three) times daily for 10 days.   Nystatin (GERHARDT'S BUTT CREAM) CREA, Apply to perianal area 2-4 times a day Disp # 1 jar   ondansetron (ZOFRAN) 4 MG tablet, Take 1 tablet (4 mg total) by mouth every 6 (six) hours as needed for nausea.   tiZANidine (ZANAFLEX) 4 MG capsule, Take 4 mg by mouth daily.  Surgical History:  She  has a past surgical history that includes Lumbar  puncture and Excisional hemorrhoidectomy. Family History:  Her family history includes Migraines in her mother; Thyroid disease in her mother. Social History:   reports that she has never smoked. She has never used smokeless tobacco. She reports current alcohol use. She reports that she does not use drugs.  Current Medications, Allergies, Past Medical History, Past Surgical History, Family History and Social History were reviewed in [employer] record.  Physical Exam: BP 120/80   Ht 5\' 4"  (1.626 m)   Wt 219 lb (99.3 kg)   BMI 37.59 kg/m  General:   Pleasant, well developed female in no acute distress Heart : Regular rate and  rhythm; no murmurs Pulm: Clear anteriorly; no wheezing Abdomen:  Soft, Obese AB, Sluggish bowel sounds. mild tenderness in the epigastrium and in the lower abdomen. With guarding and Without rebound, No organomegaly appreciated. Rectal: thickened rectum with some induration, warmth, tenderness, no fluctuation or evidence of abscess but some mild yellow discharge from right gluteal fold, unable to express more, obvious fissures on exam, did not attempt internal exam due to discomfort. Extremities:  without  edema. Neurologic:  Alert and  oriented x4;  No focal deficits.  Psych:  Cooperative. Normal mood and affect.   Vladimir Crofts, PA-C 04/08/22

## 2022-04-08 ENCOUNTER — Ambulatory Visit (INDEPENDENT_AMBULATORY_CARE_PROVIDER_SITE_OTHER): Payer: BC Managed Care – PPO | Admitting: Physician Assistant

## 2022-04-08 ENCOUNTER — Encounter: Payer: Self-pay | Admitting: Physician Assistant

## 2022-04-08 VITALS — BP 120/80 | Ht 64.0 in | Wt 219.0 lb

## 2022-04-08 DIAGNOSIS — K50119 Crohn's disease of large intestine with unspecified complications: Secondary | ICD-10-CM | POA: Diagnosis not present

## 2022-04-08 DIAGNOSIS — K5904 Chronic idiopathic constipation: Secondary | ICD-10-CM

## 2022-04-08 DIAGNOSIS — K6289 Other specified diseases of anus and rectum: Secondary | ICD-10-CM | POA: Diagnosis not present

## 2022-04-08 DIAGNOSIS — D509 Iron deficiency anemia, unspecified: Secondary | ICD-10-CM | POA: Diagnosis not present

## 2022-04-08 MED ORDER — METRONIDAZOLE 500 MG PO TABS
500.0000 mg | ORAL_TABLET | Freq: Three times a day (TID) | ORAL | 0 refills | Status: AC
Start: 2022-04-08 — End: 2022-04-18

## 2022-04-08 MED ORDER — AMBULATORY NON FORMULARY MEDICATION: 1 refills | Status: DC

## 2022-04-08 NOTE — Patient Instructions (Addendum)
Your provider has requested that you have an abdominal x ray before leaving today. Please go to the basement floor to our Radiology department for the test.  Your provider has requested that you go to the basement level for lab work before leaving today. Press "B" on the elevator. The lab is located at the first door on the left as you exit the elevator.   We have sent the following medications to your pharmacy for you to pick up at your convenience: Flagyl   Anal Fissure, Adult  Diltiazem/lidocaine 3 x daily for 2 months sent to compound pharmacy   Sent this medication to a compound pharmacy:  [facility] Address: [address] [place], [place] [address]  [phone]  Please DO NOT go directly from our office to pick up this medication! Give the pharmacy 1 day to process the prescription. Extra time is required for them to compound your medication.    Miralax is an osmotic laxative.  It only brings more water into the stool.  This is safe to take daily.  Can take up to 17 gram of miralax twice a day.  Mix with juice or coffee.  Start 1 capful at night for 3-4 days and reassess your response in 3-4 days.  You can increase and decrease the dose based on your response.  Remember, it can take up to 3-4 days to take effect OR for the effects to wear off.   Recommend increasing water and activity.  Get a squatty potty to use at home or try a stool, goal is to get your knees above your hips during a bowel movement.  - Drink at least 64-80 ounces of water/liquid per day. - Establish a time to try to move your bowels every day.  For many people, this is after a cup of coffee or after a meal such as breakfast. - Sit all of the way back on the toilet keeping your back fairly straight and while sitting up, try to rest the tops of your forearms on your upper thighs.   - Raising your feet with a step stool/squatty potty can be helpful to improve the angle that allows  your stool to pass through the rectum. - Relax the rectum feeling it bulge toward the toilet water.  If you feel your rectum raising toward your body, you are contracting rather than relaxing. - Breathe in and slowly exhale. "Belly breath" by expanding your belly towards your belly button. Keep belly expanded as you gently direct pressure down and back to the anus.  A low pitched GRRR sound can assist with increasing intra-abdominal pressure.  - Repeat 3-4 times. If unsuccessful, contract the pelvic floor to restore normal tone and get off the toilet.  Avoid excessive straining. - To reduce excessive wiping by teaching your anus to normally contract, place hands on outer aspect of knees and resist knee movement outward.  Hold 5-10 second then place hands just inside of knees and resist inward movement of knees.  Hold 5 seconds.  Repeat a few times each way.  Go to the ER if unable to pass gas, severe AB pain, unable to hold down food, any shortness of breath of chest pain.   Thank you for choosing me and [facility].  Zannie Cove

## 2022-04-12 ENCOUNTER — Other Ambulatory Visit: Payer: Self-pay | Admitting: Pharmacy Technician

## 2022-04-17 ENCOUNTER — Ambulatory Visit (INDEPENDENT_AMBULATORY_CARE_PROVIDER_SITE_OTHER)
Admission: RE | Admit: 2022-04-17 | Discharge: 2022-04-17 | Disposition: A | Discharge status: Home / Self Care | Payer: BC Managed Care – PPO | Source: Ambulatory Visit | Attending: Physician Assistant | Admitting: Physician Assistant

## 2022-04-17 ENCOUNTER — Other Ambulatory Visit: Payer: Self-pay

## 2022-04-17 ENCOUNTER — Other Ambulatory Visit (INDEPENDENT_AMBULATORY_CARE_PROVIDER_SITE_OTHER): Payer: BC Managed Care – PPO

## 2022-04-17 ENCOUNTER — Encounter: Payer: Self-pay | Admitting: Radiology

## 2022-04-17 DIAGNOSIS — K5900 Constipation, unspecified: Secondary | ICD-10-CM | POA: Diagnosis not present

## 2022-04-17 DIAGNOSIS — K50119 Crohn's disease of large intestine with unspecified complications: Secondary | ICD-10-CM

## 2022-04-17 DIAGNOSIS — R140 Abdominal distension (gaseous): Secondary | ICD-10-CM | POA: Diagnosis not present

## 2022-04-17 DIAGNOSIS — K6289 Other specified diseases of anus and rectum: Secondary | ICD-10-CM

## 2022-04-17 DIAGNOSIS — D509 Iron deficiency anemia, unspecified: Secondary | ICD-10-CM | POA: Diagnosis not present

## 2022-04-17 DIAGNOSIS — R112 Nausea with vomiting, unspecified: Secondary | ICD-10-CM | POA: Diagnosis not present

## 2022-04-17 DIAGNOSIS — Z8719 Personal history of other diseases of the digestive system: Secondary | ICD-10-CM | POA: Diagnosis not present

## 2022-04-17 LAB — BASIC METABOLIC PANEL
BUN: 9 mg/dL (ref 6–23)
CO2: 26 mEq/L (ref 19–32)
Calcium: 9.6 mg/dL (ref 8.4–10.5)
Chloride: 102 mEq/L (ref 96–112)
Creatinine, Ser: 0.89 mg/dL (ref 0.40–1.20)
GFR: 80.5 mL/min (ref >60.00)
Glucose, Bld: 102 mg/dL — ABNORMAL HIGH (ref 70–99)
Potassium: 3.8 mEq/L (ref 3.5–5.1)
Sodium: 136 mEq/L (ref 135–145)

## 2022-04-17 LAB — HEPATIC FUNCTION PANEL
ALT: 9 U/L (ref 0–35)
AST: 16 U/L (ref 0–37)
Albumin: 3.8 g/dL (ref 3.5–5.2)
Alkaline Phosphatase: 42 U/L (ref 39–117)
Bilirubin, Direct: 0.1 mg/dL (ref 0.0–0.3)
Total Bilirubin: 0.3 mg/dL (ref 0.2–1.2)
Total Protein: 8.2 g/dL (ref 6.0–8.3)

## 2022-04-17 LAB — CBC WITH DIFFERENTIAL/PLATELET
Basophils Absolute: 0 10*3/uL (ref 0.0–0.1)
Basophils Relative: 0.7 % (ref 0.0–3.0)
Eosinophils Absolute: 0.4 10*3/uL (ref 0.0–0.7)
Eosinophils Relative: 6.7 % — ABNORMAL HIGH (ref 0.0–5.0)
HCT: 36.5 % (ref 36.0–46.0)
Hemoglobin: 11.5 g/dL — ABNORMAL LOW (ref 12.0–15.0)
Lymphocytes Relative: 26.2 % (ref 12.0–46.0)
Lymphs Abs: 1.6 10*3/uL (ref 0.7–4.0)
MCHC: 31.5 g/dL (ref 30.0–36.0)
MCV: 74.8 fl — ABNORMAL LOW (ref 78.0–100.0)
Monocytes Absolute: 0.5 10*3/uL (ref 0.1–1.0)
Monocytes Relative: 7.6 % (ref 3.0–12.0)
Neutro Abs: 3.6 10*3/uL (ref 1.4–7.7)
Neutrophils Relative %: 58.8 % (ref 43.0–77.0)
Platelets: 343 10*3/uL (ref 150.0–400.0)
RBC: 4.87 Mil/uL (ref 3.87–5.11)
RDW: 18.2 % — ABNORMAL HIGH (ref 11.5–15.5)
WBC: 6.2 10*3/uL (ref 4.0–10.5)

## 2022-04-17 LAB — IBC + FERRITIN
Ferritin: 5.2 ng/mL — ABNORMAL LOW (ref 10.0–291.0)
Iron: 49 ug/dL (ref 42–145)
Saturation Ratios: 12.4 % — ABNORMAL LOW (ref 20.0–50.0)
TIBC: 394.8 ug/dL (ref 250.0–450.0)
Transferrin: 282 mg/dL (ref 212.0–360.0)

## 2022-04-17 LAB — FOLATE: Folate: 14.6 ng/mL (ref >5.9)

## 2022-04-17 LAB — SEDIMENTATION RATE: Sed Rate: 62 mm/hr — ABNORMAL HIGH (ref 0–20)

## 2022-04-17 LAB — HIGH SENSITIVITY CRP: CRP, High Sensitivity: 0.86 mg/L (ref 0.000–5.000)

## 2022-04-19 NOTE — Progress Notes (Signed)
Addendum: Reviewed and agree with assessment and management plan. Agree with starting oral iron She should come back and see [place] in 6 to 8 weeks, preferably me if I have an opening Please go ahead and check infliximab level at trough just before next infusion If pain continues repeat pelvic MRI Dakota Vanwart, Lajuan Lines, MD

## 2022-04-29 ENCOUNTER — Other Ambulatory Visit: Payer: Self-pay | Admitting: Internal Medicine

## 2022-05-22 ENCOUNTER — Other Ambulatory Visit: Payer: BC Managed Care – PPO

## 2022-05-22 DIAGNOSIS — K50119 Crohn's disease of large intestine with unspecified complications: Secondary | ICD-10-CM | POA: Diagnosis not present

## 2022-05-23 ENCOUNTER — Ambulatory Visit (INDEPENDENT_AMBULATORY_CARE_PROVIDER_SITE_OTHER): Payer: BC Managed Care – PPO | Admitting: Internal Medicine

## 2022-05-23 ENCOUNTER — Encounter: Payer: Self-pay | Admitting: Internal Medicine

## 2022-05-23 ENCOUNTER — Telehealth: Payer: Self-pay | Admitting: Pharmacy Technician

## 2022-05-23 VITALS — BP 108/84 | HR 89 | Ht 64.0 in | Wt 219.5 lb

## 2022-05-23 DIAGNOSIS — K50111 Crohn's disease of large intestine with rectal bleeding: Secondary | ICD-10-CM | POA: Diagnosis not present

## 2022-05-23 DIAGNOSIS — K50118 Crohn's disease of large intestine with other complication: Secondary | ICD-10-CM

## 2022-05-23 DIAGNOSIS — K5904 Chronic idiopathic constipation: Secondary | ICD-10-CM

## 2022-05-23 DIAGNOSIS — K50119 Crohn's disease of large intestine with unspecified complications: Secondary | ICD-10-CM

## 2022-05-23 DIAGNOSIS — D500 Iron deficiency anemia secondary to blood loss (chronic): Secondary | ICD-10-CM | POA: Insufficient documentation

## 2022-05-23 DIAGNOSIS — R110 Nausea: Secondary | ICD-10-CM

## 2022-05-23 NOTE — Telephone Encounter (Addendum)
Auth Submission: no auth needed Payer: BCBS Medication & CPT/J Code(s) submitted: Feraheme (ferumoxytol) [id number] Route of submission (phone, fax, portal): COVER MY MEDS Auth type: Buy/Bill Units/visits requested: X2 DOSES Reference number: [id number] 05/23/22 @1 :39 Approval from: 05/23/22  to  08/29/22

## 2022-05-23 NOTE — Progress Notes (Signed)
Subjective:    Patient ID: Nancy Reeves, female    DOB: [birth year], 41 y.o.   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 difficile who is here for follow-up.  She is here alone today.  She has been maintained on infliximab 5 mg/kg since January 2023.  Her last dose was 04/04/2022.  She has an infusion scheduled for 05/30/2022.  She had a trough level of infliximab drawn yesterday and the result is pending.  She reports that she has continued sharp and stabbing perianal pain but overall this is significantly better than when she was diagnosed in before infliximab was started.  This can be a near constant discomfort.  She has ongoing issues with constipation though MiraLAX has definitely made this better.  She does better if she takes this on schedule but does not think she needs it necessarily daily.  She thinks may be taking 2 days on and 2 days off might be the best dose.  When she feels constipated she has more lower abdominal cramping and pain.  She has nausea at times due to the perianal pain and estimates this is a problem every other day.  She uses Zofran and the nausea levels off.  She is not vomiting.  She is taking azathioprine 50 mg daily.  Famotidine significantly helps with her reflux.  Unfortunately she lost her job recently due to her Crohn's disease.  Her perianal skin tags bother her and make it difficult to clean.  She does use an over-the-counter nonsteroid ointment as a barrier cream which helps.  She has avoided iron because it makes her constipation worse.  She notices symptoms get a little worse a few days before infliximab infusion.   Review of Systems As per HPI, otherwise negative  Current Medications, Allergies, Past Medical History, Past Surgical  History, Family History and Social History were reviewed in [employer] record.    Objective:   Physical Exam BP 108/84   Pulse 89   Ht '5\' 4"'  (1.626 m)   Wt 219 lb 8 oz (99.6 kg)   BMI 37.68 kg/m  Gen: awake, alert, NAD HEENT: anicteric Abd: soft, NT/ND, +BS throughout Rectal: External exam only today, there is still mild ulceration and skin breakdown in the superior intergluteal cleft though this is better than when seen at the time of colonoscopy in January 2023, 2 perianal skin tags, anal canal and immediate perianal skin with induration and mild tenderness, no deep ulceration seen, no fluctuance Ext: no c/c/e Neuro: nonfocal  ESR 62 (May 2023); CRP normal  Iron/TIBC/Ferritin/ %Sat    Component Value Date/Time   IRON 49 04/17/2022 1237   TIBC 394.8 04/17/2022 1237   FERRITIN 5.2 (L) 04/17/2022 1237   IRONPCTSAT 12.4 (L) 04/17/2022 1237      Latest Ref Rng & Units 04/17/2022   12:37 PM 12/24/2021    4:05 PM 11/22/2021    1:01 PM  CBC  WBC 4.0 - 10.5 K/uL 6.2  9.2  9.6   Hemoglobin 12.0 - 15.0 g/dL 11.5  10.0  10.1   Hematocrit 36.0 - 46.0 % 36.5  32.5  32.9   Platelets 150.0 - 400.0 K/uL 343.0  401.0  353.0    CMP     Component Value Date/Time   NA 136 04/17/2022 1237  K 3.8 04/17/2022 1237   CL 102 04/17/2022 1237   CO2 26 04/17/2022 1237   GLUCOSE 102 (H) 04/17/2022 1237   BUN 9 04/17/2022 1237   CREATININE 0.89 04/17/2022 1237   CALCIUM 9.6 04/17/2022 1237   PROT 8.2 04/17/2022 1237   ALBUMIN 3.8 04/17/2022 1237   AST 16 04/17/2022 1237   ALT 9 04/17/2022 1237   ALKPHOS 42 04/17/2022 1237   BILITOT 0.3 04/17/2022 1237   GFRNONAA >60 11/01/2021 0155   GFRAA >60 08/18/2019 0530         Assessment & Plan:  41 year old female with a history of left-sided and Crohn's proctitis/perianal Crohn's officially diagnosed in January 2023), infliximab started in January 2023 5 mg/kg, 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 difficile  who is here for follow-up.   Left-sided Crohn's colitis/severe perianal and Crohn's proctitis --symptoms are certainly better on infliximab though she still has ongoing pain and symptoms of proctitis and perianal disease.  She is having some worsening of symptoms prior to infusion and so we may need to increase the dose of infliximab or decrease the infusion interval.  We discussed this today.  She is tolerating the low-dose azathioprine and so hopefully antibodies will not be an issue.  We may need to further push the dose of azathioprine for the therapeutic effect but time will tell.  Certainly keeping stool soft with MiraLAX is important for her.  We discussed how may be eventually we could have the perianal skin tags removed but I would want to wait until her Crohn's is in deep remission medically. --Await infliximab trough level --Consider increasing infliximab dose based on this information to 10 mg/kg --Continue azathioprine 50 mg daily  2.  Chronic constipation --seen on x-ray recently as well as consistent with symptoms.  We discussed the importance of MiraLAX.  I advised that she get a medication pillbox to help her remember her MiraLAX therapy.  She can put a jellybean in the pillbox for days when she should take her MiraLAX. --Continue MiraLAX titrated to effect  3.  Nausea --intermittent.  Can continue ondansetron 4 mg every 8 hours as needed for nausea.  This can be constipating as well which we discussed.  4.  Iron-deficiency anemia --secondary to #1; oral iron causes worsening of constipation and some going to recommend IV iron.  I am going to see if the infusion center can dose IV iron at her upcoming infliximab infusion

## 2022-05-23 NOTE — Patient Instructions (Addendum)
You are scheduled for your 2nd hepatitis A vaccine on 06/04/22 at 10:00 am. _______________________________________________________ Continue azathioprine, famotidine, miralax as currently prescribed. _______________________________________________________ Continue Zofran as needed. _______________________________________________________ Call our office for follow up within the next month. _______________________________________________________  If you are age 76 or older, your body mass index should be between 23-30. Your Body mass index is 37.68 kg/m. If this is out of the aforementioned range listed, please consider follow up with your Primary Care Provider.  If you are age 95 or younger, your body mass index should be between 19-25. Your Body mass index is 37.68 kg/m. If this is out of the aformentioned range listed, please consider follow up with your Primary Care Provider.   ________________________________________________________  The [facility] providers would like to encourage you to use [facility] to communicate with providers for non-urgent requests or questions.  Due to long hold times on the telephone, sending your provider a message by [facility] may be a faster and more efficient way to get a response.  Please allow 48 business hours for a response.  Please remember that this is for non-urgent requests.  _______________________________________________________  Due to recent changes in healthcare laws, you may see the results of your imaging and laboratory studies on MyChart before your provider has had a chance to review them.  We understand that in some cases there may be results that are confusing or concerning to you. Not all laboratory results come back in the same time frame and the provider may be waiting for multiple results in order to interpret others.  Please give [place] 48 hours in order for your provider to thoroughly review all the results before contacting the office for  clarification of your results.

## 2022-05-28 LAB — INFLIXIMAB+AB (SERIAL MONITOR)
Anti-Infliximab Antibody: <22 ng/mL
Infliximab Drug Level: 4.2 ug/mL

## 2022-05-28 LAB — SERIAL MONITORING

## 2022-05-28 NOTE — Progress Notes (Signed)
Dr. Rhea Belton, She has an upcoming appt on 05/30/22. I will submit an urgent request for the increase in dose.  I will f/u once I have a response. Selena Batten

## 2022-05-30 ENCOUNTER — Ambulatory Visit (INDEPENDENT_AMBULATORY_CARE_PROVIDER_SITE_OTHER): Payer: BC Managed Care – PPO

## 2022-05-30 ENCOUNTER — Telehealth: Payer: Self-pay | Admitting: Pharmacy Technician

## 2022-05-30 VITALS — BP 116/80 | HR 94 | Temp 98.7°F | Resp 18 | Ht 64.0 in | Wt 219.4 lb

## 2022-05-30 DIAGNOSIS — D500 Iron deficiency anemia secondary to blood loss (chronic): Secondary | ICD-10-CM

## 2022-05-30 DIAGNOSIS — K50111 Crohn's disease of large intestine with rectal bleeding: Secondary | ICD-10-CM | POA: Diagnosis not present

## 2022-05-30 MED ORDER — SODIUM CHLORIDE 0.9 % IV SOLN
510.0000 mg | Freq: Once | INTRAVENOUS | Status: AC
  Administered 2022-05-30: 510 mg via INTRAVENOUS
  Filled 2022-05-30: qty 17

## 2022-05-30 MED ORDER — DIPHENHYDRAMINE HCL 25 MG PO CAPS
25.0000 mg | ORAL_CAPSULE | Freq: Once | ORAL | Status: AC
  Administered 2022-05-30: 25 mg via ORAL
  Filled 2022-05-30: qty 1

## 2022-05-30 MED ORDER — METHYLPREDNISOLONE SODIUM SUCC 40 MG IJ SOLR
40.0000 mg | Freq: Once | INTRAMUSCULAR | Status: AC
  Administered 2022-05-30: 40 mg via INTRAVENOUS
  Filled 2022-05-30: qty 1

## 2022-05-30 MED ORDER — SODIUM CHLORIDE 0.9 % IV SOLN
5.0000 mg/kg | Freq: Once | INTRAVENOUS | Status: AC
  Administered 2022-05-30: 500 mg via INTRAVENOUS
  Filled 2022-05-30: qty 50

## 2022-05-30 MED ORDER — ACETAMINOPHEN 325 MG PO TABS
650.0000 mg | ORAL_TABLET | Freq: Once | ORAL | Status: AC
  Administered 2022-05-30: 650 mg via ORAL
  Filled 2022-05-30: qty 2

## 2022-05-30 NOTE — Progress Notes (Signed)
Diagnosis: Crohn's Disease  Provider:  Chilton Greathouse, MD  Procedure: Infusion  IV Type: Peripheral, IV Location: L Antecubital  Avsola (Infliximab-axxq), Dose: 500 mg  Infusion Start Time: 1137  Infusion Stop Time: 1400   Feraheme (Ferumoxytol), Dose: 510 mg  Infusion Start Time: 1416  Infusion Stop Time: 1435  Post Infusion IV Care: Observation period completed and Peripheral IV Discontinued  Discharge: Condition: Good, Destination: Home . AVS provided to patient.   Performed by:  Loney Hering, LPN

## 2022-05-30 NOTE — Telephone Encounter (Addendum)
Dr. Rhea Belton, [place] Childes note:   Increase in Avsola 10mg /kg  Auth Submission: APPROVED  Payer: BCBS Medication & CPT/J Code(s) submitted: Avsola (infliximab-axxq) [phone] Route of submission (phone, fax, portal): PHONE: [phone] FAX: [phone] Auth type: Buy/Bill Units/visits requested: 10MG /KG (700 UNITS) Reference number: [facility] Approval from: 05/28/22 to 05/27/23

## 2022-05-31 DIAGNOSIS — M25571 Pain in right ankle and joints of right foot: Secondary | ICD-10-CM | POA: Diagnosis not present

## 2022-05-31 DIAGNOSIS — S92351A Displaced fracture of fifth metatarsal bone, right foot, initial encounter for closed fracture: Secondary | ICD-10-CM | POA: Diagnosis not present

## 2022-05-31 DIAGNOSIS — M79671 Pain in right foot: Secondary | ICD-10-CM | POA: Diagnosis not present

## 2022-06-01 ENCOUNTER — Other Ambulatory Visit: Payer: Self-pay | Admitting: Internal Medicine

## 2022-06-03 DIAGNOSIS — S99921A Unspecified injury of right foot, initial encounter: Secondary | ICD-10-CM | POA: Diagnosis not present

## 2022-06-03 DIAGNOSIS — S92351A Displaced fracture of fifth metatarsal bone, right foot, initial encounter for closed fracture: Secondary | ICD-10-CM | POA: Diagnosis not present

## 2022-06-05 ENCOUNTER — Ambulatory Visit (INDEPENDENT_AMBULATORY_CARE_PROVIDER_SITE_OTHER): Payer: BC Managed Care – PPO | Admitting: Internal Medicine

## 2022-06-05 DIAGNOSIS — Z23 Encounter for immunization: Secondary | ICD-10-CM | POA: Diagnosis not present

## 2022-06-05 DIAGNOSIS — K50119 Crohn's disease of large intestine with unspecified complications: Secondary | ICD-10-CM

## 2022-06-06 ENCOUNTER — Ambulatory Visit (INDEPENDENT_AMBULATORY_CARE_PROVIDER_SITE_OTHER): Payer: BC Managed Care – PPO

## 2022-06-06 VITALS — BP 108/74 | HR 95 | Temp 99.2°F | Resp 18 | Ht 64.0 in | Wt 220.4 lb

## 2022-06-06 DIAGNOSIS — D500 Iron deficiency anemia secondary to blood loss (chronic): Secondary | ICD-10-CM | POA: Diagnosis not present

## 2022-06-06 MED ORDER — SODIUM CHLORIDE 0.9 % IV SOLN
510.0000 mg | Freq: Once | INTRAVENOUS | Status: AC
  Administered 2022-06-06: 510 mg via INTRAVENOUS
  Filled 2022-06-06: qty 17

## 2022-06-06 NOTE — Progress Notes (Signed)
Diagnosis: Iron Deficiency Anemia  Provider:  Chilton Greathouse, MD  Procedure: Infusion  IV Type: Peripheral, IV Location: L Antecubital  Feraheme (Ferumoxytol), Dose: 510 mg  Infusion Start Time: 1055  Infusion Stop Time: 1115  Post Infusion IV Care: Peripheral IV Discontinued  Discharge: Condition: Good, Destination: Home . AVS provided to patient.   Performed by:  Loney Hering, LPN

## 2022-06-07 ENCOUNTER — Other Ambulatory Visit: Payer: Self-pay | Admitting: Pharmacy Technician

## 2022-06-27 ENCOUNTER — Other Ambulatory Visit: Payer: Self-pay

## 2022-06-27 ENCOUNTER — Ambulatory Visit (INDEPENDENT_AMBULATORY_CARE_PROVIDER_SITE_OTHER): Payer: BC Managed Care – PPO

## 2022-06-27 ENCOUNTER — Other Ambulatory Visit: Payer: BC Managed Care – PPO

## 2022-06-27 VITALS — BP 115/76 | HR 85 | Temp 98.6°F | Resp 18 | Ht 64.0 in | Wt 223.8 lb

## 2022-06-27 DIAGNOSIS — D500 Iron deficiency anemia secondary to blood loss (chronic): Secondary | ICD-10-CM

## 2022-06-27 DIAGNOSIS — K50119 Crohn's disease of large intestine with unspecified complications: Secondary | ICD-10-CM

## 2022-06-27 MED ORDER — ACETAMINOPHEN 325 MG PO TABS: 650.0000 mg | ORAL_TABLET | Freq: Once | ORAL | Status: DC

## 2022-06-27 MED ORDER — METHYLPREDNISOLONE SODIUM SUCC 40 MG IJ SOLR
40.0000 mg | Freq: Once | INTRAMUSCULAR | Status: AC
  Administered 2022-06-27: 40 mg via INTRAVENOUS
  Filled 2022-06-27: qty 1

## 2022-06-27 MED ORDER — SODIUM CHLORIDE 0.9 % IV SOLN
10.0000 mg/kg | Freq: Once | INTRAVENOUS | Status: AC
  Administered 2022-06-27: 1000 mg via INTRAVENOUS
  Filled 2022-06-27: qty 100

## 2022-06-27 MED ORDER — DIPHENHYDRAMINE HCL 25 MG PO CAPS
25.0000 mg | ORAL_CAPSULE | Freq: Once | ORAL | Status: AC
  Administered 2022-06-27: 25 mg via ORAL
  Filled 2022-06-27: qty 1

## 2022-06-27 NOTE — Progress Notes (Signed)
Diagnosis: Crohn's Disease  Provider:  Marshell Garfinkel, MD  Procedure: Infusion  IV Type: Peripheral, IV Location: L Forearm  Avsola (Infliximab-axxq), Dose: 1000 mg  Infusion Start Time: 6384  Infusion Stop Time: 5364  Post Infusion IV Care: Peripheral IV Discontinued  Discharge: Condition: Good, Destination: Home . AVS provided to patient.   Performed by:  Koren Shiver, RN

## 2022-07-23 ENCOUNTER — Telehealth: Payer: Self-pay

## 2022-07-23 NOTE — Telephone Encounter (Signed)
-----   Message from Algernon Huxley, RN sent at 05/28/2022  1:06 PM EDT ----- Regarding: Infliximab level Pt needs labs prior to infusion on 9/7

## 2022-07-23 NOTE — Telephone Encounter (Signed)
Left message for pt to come in for labs.

## 2022-08-15 ENCOUNTER — Encounter: Payer: Self-pay | Admitting: Internal Medicine

## 2022-08-19 ENCOUNTER — Telehealth: Payer: Self-pay | Admitting: Pharmacy Technician

## 2022-08-19 NOTE — Telephone Encounter (Signed)
Patient no longer has BCBS. Insurance has termed. Patient is awaiting new insurance.  Patient was advised about PAP option due to being un-insured, patient felt confident that the new insurance will be active shortly. Upcoming appt has been cancelled until new insurance is active.

## 2022-08-22 ENCOUNTER — Ambulatory Visit: Payer: BC Managed Care – PPO

## 2022-09-13 ENCOUNTER — Encounter: Payer: Self-pay | Admitting: Internal Medicine

## 2022-09-13 NOTE — Telephone Encounter (Signed)
F/U: new insurance Spoke with patient in regards to new insurance card.  New insurance will be active 09/18/22. Patient will call to give card info on 09/18/22.  Will submit new auth after 09/18/22.

## 2022-10-08 ENCOUNTER — Encounter: Payer: Self-pay | Admitting: Internal Medicine

## 2022-10-08 NOTE — Telephone Encounter (Addendum)
New insurance: BCBS  Auth Submission: APPROVED Payer: BCBS Medication & CPT/J Code(s) submitted: Avsola (infliximab-axxq) [phone] Route of submission (phone, fax, portal): COVER MY MEDS Phone # Fax # Auth type: Buy/Bill Units/visits requested: 1000 MG Q8WKS (700 UNITS) 7 visits. Reference number: [id number] DATE: 10/08/22 - 10/07/23

## 2022-10-14 ENCOUNTER — Encounter: Payer: Self-pay | Admitting: Internal Medicine

## 2022-10-23 ENCOUNTER — Ambulatory Visit (INDEPENDENT_AMBULATORY_CARE_PROVIDER_SITE_OTHER): Payer: BC Managed Care – PPO

## 2022-10-23 VITALS — BP 101/73 | HR 87 | Temp 98.8°F | Resp 16 | Ht 64.0 in | Wt 223.0 lb

## 2022-10-23 DIAGNOSIS — K50111 Crohn's disease of large intestine with rectal bleeding: Secondary | ICD-10-CM | POA: Diagnosis not present

## 2022-10-23 DIAGNOSIS — K50119 Crohn's disease of large intestine with unspecified complications: Secondary | ICD-10-CM

## 2022-10-23 MED ORDER — SODIUM CHLORIDE 0.9 % IV SOLN
10.0000 mg/kg | Freq: Once | INTRAVENOUS | Status: AC
  Administered 2022-10-23: 1000 mg via INTRAVENOUS
  Filled 2022-10-23: qty 100

## 2022-10-23 MED ORDER — DIPHENHYDRAMINE HCL 25 MG PO CAPS
25.0000 mg | ORAL_CAPSULE | Freq: Once | ORAL | Status: AC
  Administered 2022-10-23: 25 mg via ORAL
  Filled 2022-10-23: qty 1

## 2022-10-23 MED ORDER — ACETAMINOPHEN 325 MG PO TABS
650.0000 mg | ORAL_TABLET | Freq: Once | ORAL | Status: AC
  Administered 2022-10-23: 650 mg via ORAL
  Filled 2022-10-23: qty 2

## 2022-10-23 MED ORDER — METHYLPREDNISOLONE SODIUM SUCC 40 MG IJ SOLR
40.0000 mg | Freq: Once | INTRAMUSCULAR | Status: AC
  Administered 2022-10-23: 40 mg via INTRAVENOUS
  Filled 2022-10-23: qty 1

## 2022-10-23 NOTE — Progress Notes (Signed)
Diagnosis: Infusion  Provider:  Marshell Garfinkel MD  Procedure: Infusion  IV Type: Peripheral, IV Location: L Antecubital  Avsola (infliximab-axxq), Dose: 1000 mg  Infusion Start Time: 1024  Infusion Stop Time: 4707  Post Infusion IV Care: Peripheral IV Discontinued  Discharge: Condition: Good, Destination: Home . AVS provided to patient.   Performed by:  Adelina Mings, LPN

## 2022-12-18 ENCOUNTER — Other Ambulatory Visit: Payer: Self-pay

## 2022-12-18 ENCOUNTER — Other Ambulatory Visit: Payer: BC Managed Care – PPO

## 2022-12-18 ENCOUNTER — Ambulatory Visit (INDEPENDENT_AMBULATORY_CARE_PROVIDER_SITE_OTHER): Payer: BC Managed Care – PPO

## 2022-12-18 VITALS — BP 118/81 | HR 78 | Temp 98.6°F | Resp 18 | Ht 64.0 in | Wt 226.2 lb

## 2022-12-18 DIAGNOSIS — K50119 Crohn's disease of large intestine with unspecified complications: Secondary | ICD-10-CM | POA: Diagnosis not present

## 2022-12-18 DIAGNOSIS — K50118 Crohn's disease of large intestine with other complication: Secondary | ICD-10-CM

## 2022-12-18 DIAGNOSIS — K50111 Crohn's disease of large intestine with rectal bleeding: Secondary | ICD-10-CM | POA: Diagnosis not present

## 2022-12-18 MED ORDER — METHYLPREDNISOLONE SODIUM SUCC 40 MG IJ SOLR
40.0000 mg | Freq: Once | INTRAMUSCULAR | Status: AC
  Administered 2022-12-18: 40 mg via INTRAVENOUS
  Filled 2022-12-18: qty 1

## 2022-12-18 MED ORDER — DIPHENHYDRAMINE HCL 25 MG PO CAPS
25.0000 mg | ORAL_CAPSULE | Freq: Once | ORAL | Status: AC
  Administered 2022-12-18: 25 mg via ORAL
  Filled 2022-12-18: qty 1

## 2022-12-18 MED ORDER — SODIUM CHLORIDE 0.9 % IV SOLN
10.0000 mg/kg | Freq: Once | INTRAVENOUS | Status: AC
  Administered 2022-12-18: 1000 mg via INTRAVENOUS
  Filled 2022-12-18: qty 100

## 2022-12-18 MED ORDER — ACETAMINOPHEN 325 MG PO TABS
650.0000 mg | ORAL_TABLET | Freq: Once | ORAL | Status: AC
  Administered 2022-12-18: 650 mg via ORAL
  Filled 2022-12-18: qty 2

## 2022-12-18 NOTE — Progress Notes (Signed)
Diagnosis: Crohn's Disease  Provider:  Marshell Garfinkel MD  Procedure: Infusion  IV Type: Peripheral, IV Location: L Antecubital  Avsola (infliximab-axxq), Dose: 1000 mg  Infusion Start Time: 5956  Infusion Stop Time: 3875  Post Infusion IV Care: Peripheral IV Discontinued  Discharge: Condition: Good, Destination: Home . AVS provided to patient.   Performed by:  Koren Shiver, RN

## 2022-12-23 ENCOUNTER — Telehealth: Payer: Self-pay | Admitting: Internal Medicine

## 2022-12-23 LAB — INFLIXIMAB+AB (SERIAL MONITOR)
Anti-Infliximab Antibody: 156 ng/mL
Infliximab Drug Level: 3.8 ug/mL

## 2022-12-23 LAB — SERIAL MONITORING

## 2022-12-23 NOTE — Telephone Encounter (Signed)
Patient states that she is still exhausted and in pain. She states that her constipation is doing better but would like a nurse to callback.

## 2022-12-24 ENCOUNTER — Telehealth: Payer: Self-pay

## 2022-12-24 ENCOUNTER — Other Ambulatory Visit: Payer: Self-pay

## 2022-12-24 MED ORDER — HUMIRA-CD/UC/HS STARTER 80 MG/0.8ML ~~LOC~~ AJKT: AUTO-INJECTOR | SUBCUTANEOUS | 0 refills | Status: DC

## 2022-12-24 MED ORDER — HUMIRA (2 PEN) 40 MG/0.4ML ~~LOC~~ AJKT: 40.0000 mg | AUTO-INJECTOR | SUBCUTANEOUS | 11 refills | Status: DC

## 2022-12-24 NOTE — Telephone Encounter (Signed)
See result notes, pt will be starting on Humira.

## 2022-12-24 NOTE — Telephone Encounter (Signed)
Pt needs to start on Humira starter  kit then maintenance dose. Prescriptions sent to pharmacy. Will need PA.

## 2023-01-01 ENCOUNTER — Telehealth: Payer: Self-pay | Admitting: Pharmacy Technician

## 2023-01-01 NOTE — Telephone Encounter (Signed)
Spoke with pt and let her know that per [place] with pharmacy PA team the prior auth has been expidited. Will await further communication from her. Let pt know we will let her know when we hear back.

## 2023-01-01 NOTE — Telephone Encounter (Signed)
Patient Advocate Encounter  Received notification from [facility] that prior authorization for HUMIRA is required.   PA submitted on 2.14.24 EXPEDITED Key [id number] Status is pending

## 2023-01-01 NOTE — Telephone Encounter (Signed)
Inbound call from patient ,want to know how long will take for her medication"Humira" to be approve .Marland KitchenPlease advise

## 2023-01-02 ENCOUNTER — Other Ambulatory Visit (HOSPITAL_COMMUNITY): Payer: Self-pay

## 2023-01-02 ENCOUNTER — Encounter: Payer: Self-pay | Admitting: Internal Medicine

## 2023-01-02 NOTE — Telephone Encounter (Addendum)
Submitted Patient Assistance Application to [place] for Nancy Reeves along with provider portion, PA and income documents. Will update patient when we receive a response.  Fax# [phone] Phone# [phone]   Completed PAP application with [employer] for Nancy Reeves has been faxed to office [phone] for provider section to be completed. Spoke with pt via phone and was given verbal consent to sign on her behalf. Please fax completed form/insurance/med list and allergies to [employer] [phone].

## 2023-01-02 NOTE — Telephone Encounter (Signed)
Patient Advocate Encounter  Prior Authorization for Nancy Reeves has been approved.    PA# --- Effective dates: 2.14.25 through 2.12.25  Test billing did return a copay of >$7000. Submit Benefit Verification in [employer] for patient assistance.

## 2023-01-03 NOTE — Telephone Encounter (Signed)
Pt assistance forms faxed.

## 2023-01-09 NOTE — Telephone Encounter (Signed)
Any update?

## 2023-01-27 ENCOUNTER — Encounter: Payer: Self-pay | Admitting: *Deleted

## 2023-01-28 DIAGNOSIS — N6311 Unspecified lump in the right breast, upper outer quadrant: Secondary | ICD-10-CM | POA: Diagnosis not present

## 2023-02-07 ENCOUNTER — Other Ambulatory Visit (HOSPITAL_COMMUNITY): Payer: Self-pay

## 2023-02-08 ENCOUNTER — Other Ambulatory Visit (HOSPITAL_COMMUNITY): Payer: Self-pay

## 2023-02-10 ENCOUNTER — Other Ambulatory Visit (HOSPITAL_COMMUNITY): Payer: Self-pay

## 2023-02-10 ENCOUNTER — Encounter: Payer: Self-pay | Admitting: Internal Medicine

## 2023-02-10 NOTE — Telephone Encounter (Signed)
Received a fax from  [place] regarding pt being eligible for [employer]'s savings card for  [place].   ID: [id number] BIN: [id number] PCN: [id number]

## 2023-02-10 NOTE — Telephone Encounter (Signed)
So I do not need to do anything further correct?

## 2023-02-11 ENCOUNTER — Other Ambulatory Visit (HOSPITAL_COMMUNITY): Payer: Self-pay

## 2023-02-11 ENCOUNTER — Other Ambulatory Visit: Payer: Self-pay

## 2023-02-11 ENCOUNTER — Encounter: Payer: Self-pay | Admitting: Internal Medicine

## 2023-02-11 MED ORDER — HUMIRA-CD/UC/HS STARTER 80 MG/0.8ML ~~LOC~~ AJKT
AUTO-INJECTOR | SUBCUTANEOUS | 0 refills | Status: DC
  Filled 2023-02-11: qty 3, fill #0
  Filled 2023-02-12 – 2023-02-14 (×2): qty 3, 28d supply, fill #0

## 2023-02-11 MED ORDER — HUMIRA (2 PEN) 40 MG/0.4ML ~~LOC~~ AJKT
40.0000 mg | AUTO-INJECTOR | SUBCUTANEOUS | 11 refills | Status: DC
  Filled 2023-02-11: qty 2, fill #0
  Filled 2023-03-04: qty 2, 28d supply, fill #0
  Filled 2023-04-11: qty 2, 28d supply, fill #1
  Filled 2023-05-09: qty 2, 28d supply, fill #2
  Filled 2023-06-05: qty 2, 28d supply, fill #3
  Filled 2023-07-17 – 2023-08-06 (×4): qty 2, 28d supply, fill #4
  Filled 2023-09-03: qty 2, 28d supply, fill #5

## 2023-02-11 NOTE — Telephone Encounter (Signed)
Scripts sent as requested. 

## 2023-02-12 ENCOUNTER — Ambulatory Visit (INDEPENDENT_AMBULATORY_CARE_PROVIDER_SITE_OTHER): Payer: BC Managed Care – PPO

## 2023-02-12 ENCOUNTER — Other Ambulatory Visit (HOSPITAL_COMMUNITY): Payer: Self-pay

## 2023-02-12 VITALS — BP 106/73 | HR 54 | Temp 98.7°F | Resp 18 | Ht 62.0 in | Wt 225.2 lb

## 2023-02-12 DIAGNOSIS — K50119 Crohn's disease of large intestine with unspecified complications: Secondary | ICD-10-CM

## 2023-02-12 DIAGNOSIS — K50111 Crohn's disease of large intestine with rectal bleeding: Secondary | ICD-10-CM

## 2023-02-12 DIAGNOSIS — N6311 Unspecified lump in the right breast, upper outer quadrant: Secondary | ICD-10-CM | POA: Diagnosis not present

## 2023-02-12 DIAGNOSIS — N641 Fat necrosis of breast: Secondary | ICD-10-CM | POA: Diagnosis not present

## 2023-02-12 MED ORDER — ACETAMINOPHEN 325 MG PO TABS
650.0000 mg | ORAL_TABLET | Freq: Once | ORAL | Status: AC
  Administered 2023-02-12: 650 mg via ORAL
  Filled 2023-02-12: qty 2

## 2023-02-12 MED ORDER — SODIUM CHLORIDE 0.9 % IV SOLN
10.0000 mg/kg | Freq: Once | INTRAVENOUS | Status: AC
  Administered 2023-02-12: 1000 mg via INTRAVENOUS
  Filled 2023-02-12: qty 100

## 2023-02-12 MED ORDER — DIPHENHYDRAMINE HCL 25 MG PO CAPS
25.0000 mg | ORAL_CAPSULE | Freq: Once | ORAL | Status: AC
  Administered 2023-02-12: 25 mg via ORAL
  Filled 2023-02-12: qty 1

## 2023-02-12 MED ORDER — METHYLPREDNISOLONE SODIUM SUCC 40 MG IJ SOLR
40.0000 mg | Freq: Once | INTRAMUSCULAR | Status: AC
  Administered 2023-02-12: 40 mg via INTRAVENOUS
  Filled 2023-02-12: qty 1

## 2023-02-12 NOTE — Progress Notes (Signed)
Diagnosis: Crohn's Disease  Provider:  Marshell Garfinkel MD  Procedure: Infusion  IV Type: Peripheral, IV Location: L Antecubital  Avsola (infliximab-axxq), Dose: 1000 mg  Infusion Start Time: 1101  Infusion Stop Time: [id number]  Post Infusion IV Care: Peripheral IV Discontinued  Discharge: Condition: Good, Destination: Home . AVS Declined  Performed by:  Cleophus Molt, RN

## 2023-02-13 ENCOUNTER — Other Ambulatory Visit (HOSPITAL_COMMUNITY): Payer: Self-pay

## 2023-02-13 ENCOUNTER — Other Ambulatory Visit: Payer: Self-pay

## 2023-02-14 ENCOUNTER — Other Ambulatory Visit (HOSPITAL_COMMUNITY): Payer: Self-pay

## 2023-02-14 ENCOUNTER — Other Ambulatory Visit: Payer: Self-pay

## 2023-02-17 ENCOUNTER — Telehealth: Payer: Self-pay | Admitting: Internal Medicine

## 2023-02-17 ENCOUNTER — Other Ambulatory Visit (HOSPITAL_COMMUNITY): Payer: Self-pay

## 2023-02-17 NOTE — Telephone Encounter (Signed)
Inbound call from patient, states she received her Humira injections and she is unsure if she should begin them right away since she had an infusion on Wednesday. Would like to speak with a nurse to further advise.

## 2023-02-17 NOTE — Telephone Encounter (Signed)
Pt just got her Humira in the mail. She received her Remicade infusion on 02/12/23. Pt wants to know if she is ok to go ahead and start the Humira or does she need a wait a little while since she just got the Remicade. Please advise.

## 2023-02-18 NOTE — Telephone Encounter (Signed)
Spoke with pt and let her know

## 2023-02-18 NOTE — Telephone Encounter (Signed)
She can begin Humira now

## 2023-02-19 ENCOUNTER — Other Ambulatory Visit (HOSPITAL_COMMUNITY): Payer: Self-pay

## 2023-02-27 ENCOUNTER — Other Ambulatory Visit: Payer: Self-pay

## 2023-02-28 DIAGNOSIS — N62 Hypertrophy of breast: Secondary | ICD-10-CM | POA: Diagnosis not present

## 2023-03-04 ENCOUNTER — Other Ambulatory Visit (HOSPITAL_COMMUNITY): Payer: Self-pay

## 2023-03-04 ENCOUNTER — Other Ambulatory Visit: Payer: Self-pay

## 2023-03-11 ENCOUNTER — Encounter: Payer: Self-pay | Admitting: Internal Medicine

## 2023-03-11 ENCOUNTER — Ambulatory Visit (INDEPENDENT_AMBULATORY_CARE_PROVIDER_SITE_OTHER): Payer: BC Managed Care – PPO | Admitting: Internal Medicine

## 2023-03-11 ENCOUNTER — Other Ambulatory Visit (INDEPENDENT_AMBULATORY_CARE_PROVIDER_SITE_OTHER): Payer: BC Managed Care – PPO

## 2023-03-11 VITALS — BP 118/72 | Ht 64.0 in | Wt 227.0 lb

## 2023-03-11 DIAGNOSIS — K50119 Crohn's disease of large intestine with unspecified complications: Secondary | ICD-10-CM | POA: Diagnosis not present

## 2023-03-11 DIAGNOSIS — D500 Iron deficiency anemia secondary to blood loss (chronic): Secondary | ICD-10-CM | POA: Diagnosis not present

## 2023-03-11 DIAGNOSIS — R195 Other fecal abnormalities: Secondary | ICD-10-CM | POA: Diagnosis not present

## 2023-03-11 LAB — C-REACTIVE PROTEIN: CRP: <1 mg/dL (ref 0.5–20.0)

## 2023-03-11 LAB — CBC
HCT: 37.9 % (ref 36.0–46.0)
Hemoglobin: 12.3 g/dL (ref 12.0–15.0)
MCHC: 32.6 g/dL (ref 30.0–36.0)
MCV: 80.7 fl (ref 78.0–100.0)
Platelets: 253 10*3/uL (ref 150.0–400.0)
RBC: 4.7 Mil/uL (ref 3.87–5.11)
RDW: 14 % (ref 11.5–15.5)
WBC: 6.2 10*3/uL (ref 4.0–10.5)

## 2023-03-11 LAB — IBC + FERRITIN
Ferritin: 79 ng/mL (ref 10.0–291.0)
Iron: 45 ug/dL (ref 42–145)
Saturation Ratios: 15.5 % — ABNORMAL LOW (ref 20.0–50.0)
TIBC: 291.2 ug/dL (ref 250.0–450.0)
Transferrin: 208 mg/dL — ABNORMAL LOW (ref 212.0–360.0)

## 2023-03-11 NOTE — Progress Notes (Signed)
Subjective:    Patient ID: Nancy Reeves, female    DOB: Feb 09, 1981, 42 y.o.   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 difficile who is here for follow-up.  She is here alone today.  She was last seen on 05/23/2022.  Due to ongoing symptoms we checked her infliximab level on the last day of January 2024 and it was subtherapeutic at 3.8 with antibodies at 156.  It took a bit of time to get adalimumab approved and she started with standard induction 2 weeks ago yesterday.  She reports that she is still having intermittent sharp pains in the perianal area.  It can take 2 to 3 hours to have her bowel movement.  Some day stools are regular other times difficult to pass.  She has not really taken MiraLAX because she is off of Vicodin and is not feeling as much constipation.  She does still feel incomplete evacuation.  Intermittent blood is seen with stools but not daily.  Her Crohn's disease has activated her fibromyalgia and she has multiple points of stabbing discomfort.  She is also using turmeric.  She has continued her azathioprine at 50 mg daily.  She has been approved for breast reduction which is scheduled for June.  She admits at times to having a shorter fuse due to her Crohn's disease.  Her husband has pointed this out and this makes her sad.  She is trying to be more tolerant of things at home.   Review of Systems As per HPI, otherwise negative  Current Medications, Allergies, Past Medical History, Past Surgical History, Family History and Social History were reviewed in [employer] record.     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 difficile who is here for follow-up.  Left-sided Crohn's colitis/severe perianal Crohn's with proctitis --unfortunately she developed antibodies to infliximab and had return of her active proctitis and perianal symptoms.  This is despite low-dose azathioprine.  Recent change to adalimumab.  Plan as follows: --Check adalimumab trough 4 weeks from yesterday which will be after 2 maintenance doses; consider weekly dosing if needed to maintain adequate trough -- Continue azathioprine 50 mg daily -- Colonoscopy in July for assessment of disease activity -- Check CRP today, fecal calprotectin at follow-up  2.  Incomplete evacuation --secondary to #1.  Add back MiraLAX half dose to 1 dose daily to keep stools soft and easier to pass  3.  IDA --secondary to #1.  Previous IV iron infusion.  Recheck CBC and ferritin plus IVC  4.  Plan for breast reduction --discussed trying to not interrupt adalimumab therapy but if felt necessary  per her plastic surgeon for a short of an interval as possible.

## 2023-03-11 NOTE — Patient Instructions (Addendum)
_______________________________________________________  If your blood pressure at your visit was 140/90 or greater, please contact your primary care physician to follow up on this.  If you are age 43 or younger, your body mass index should be between 19-25. Your Body mass index is 38.96 kg/m. If this is out of the aformentioned range listed, please consider follow up with your Primary Care Provider.  ________________________________________________________  The [facility] providers would like to encourage you to use [facility] to communicate with providers for non-urgent requests or questions.  Due to long hold times on the telephone, sending your provider a message by [facility] may be a faster and more efficient way to get a response.  Please allow 48 business hours for a response.  Please remember that this is for non-urgent requests.  _______________________________________________________  Your provider has requested that you go to the basement level for lab work before leaving today. Press "B" on the elevator. The lab is located at the first door on the left as you exit the elevator.  Your provider has requested that you go to the basement level for lab work on 04-07-23. Press "B" on the elevator. The lab is located at the first door on the left as you exit the elevator.  You have been scheduled for a colonoscopy on 06-09-23 at 1:30pm.  You will have a previsit appointment on 05-26-23 at 10:00am.  You can use Miralax 1/2 capful in 8 - 10 ounces of water daily.  Due to recent changes in healthcare laws, you may see the results of your imaging and laboratory studies on MyChart before your provider has had a chance to review them.  We understand that in some cases there may be results that are confusing or concerning to you. Not all laboratory results come back in the same time frame and the provider may be waiting for multiple results in order to interpret others.  Please give [place] 48 hours in order for  your provider to thoroughly review all the results before contacting the office for clarification of your results.   Thank you for entrusting me with your care and choosing [facility].  Dr Rhea Belton

## 2023-03-17 ENCOUNTER — Other Ambulatory Visit: Payer: Self-pay

## 2023-04-08 ENCOUNTER — Telehealth: Payer: Self-pay | Admitting: Internal Medicine

## 2023-04-08 NOTE — Telephone Encounter (Signed)
Patient called stating she has been taking her Humira infusions for about 2 months. She is requesting a call back to be advised on whether she should continue with her Avsola infusions as well. States she has an appointment tomorrow but does not know whether she should keep it. Please advise, thank you.

## 2023-04-08 NOTE — Telephone Encounter (Signed)
Spoke with pt and let her know she should only be doing the Humira she does not need the Avsola.

## 2023-04-09 ENCOUNTER — Ambulatory Visit: Payer: BC Managed Care – PPO

## 2023-04-11 ENCOUNTER — Other Ambulatory Visit (HOSPITAL_COMMUNITY): Payer: Self-pay

## 2023-04-16 ENCOUNTER — Other Ambulatory Visit (HOSPITAL_COMMUNITY): Payer: Self-pay

## 2023-04-29 ENCOUNTER — Other Ambulatory Visit: Payer: Self-pay | Admitting: Pharmacy Technician

## 2023-05-02 DIAGNOSIS — N62 Hypertrophy of breast: Secondary | ICD-10-CM | POA: Diagnosis not present

## 2023-05-05 DIAGNOSIS — G8918 Other acute postprocedural pain: Secondary | ICD-10-CM | POA: Diagnosis not present

## 2023-05-05 DIAGNOSIS — N62 Hypertrophy of breast: Secondary | ICD-10-CM | POA: Diagnosis not present

## 2023-05-05 DIAGNOSIS — M542 Cervicalgia: Secondary | ICD-10-CM | POA: Diagnosis not present

## 2023-05-05 DIAGNOSIS — M549 Dorsalgia, unspecified: Secondary | ICD-10-CM | POA: Diagnosis not present

## 2023-05-05 HISTORY — PX: BREAST REDUCTION SURGERY: SHX8

## 2023-05-09 ENCOUNTER — Other Ambulatory Visit: Payer: Self-pay

## 2023-05-09 ENCOUNTER — Other Ambulatory Visit (HOSPITAL_COMMUNITY): Payer: Self-pay

## 2023-05-13 DIAGNOSIS — N62 Hypertrophy of breast: Secondary | ICD-10-CM | POA: Diagnosis not present

## 2023-05-14 ENCOUNTER — Other Ambulatory Visit (HOSPITAL_COMMUNITY): Payer: Self-pay

## 2023-05-15 ENCOUNTER — Other Ambulatory Visit (HOSPITAL_COMMUNITY): Payer: Self-pay

## 2023-05-16 ENCOUNTER — Other Ambulatory Visit (HOSPITAL_COMMUNITY): Payer: Self-pay

## 2023-05-19 ENCOUNTER — Other Ambulatory Visit (HOSPITAL_COMMUNITY): Payer: Self-pay

## 2023-05-26 ENCOUNTER — Encounter: Payer: BC Managed Care – PPO | Admitting: Internal Medicine

## 2023-05-26 DIAGNOSIS — M545 Low back pain, unspecified: Secondary | ICD-10-CM | POA: Insufficient documentation

## 2023-05-27 ENCOUNTER — Ambulatory Visit (AMBULATORY_SURGERY_CENTER): Payer: BC Managed Care – PPO

## 2023-05-27 ENCOUNTER — Encounter: Payer: Self-pay | Admitting: Internal Medicine

## 2023-05-27 VITALS — Ht 64.0 in | Wt 212.0 lb

## 2023-05-27 DIAGNOSIS — K50119 Crohn's disease of large intestine with unspecified complications: Secondary | ICD-10-CM

## 2023-05-27 MED ORDER — NA SULFATE-K SULFATE-MG SULF 17.5-3.13-1.6 GM/177ML PO SOLN: 1.0000 | Freq: Once | ORAL | 0 refills | Status: AC

## 2023-05-27 MED ORDER — ONDANSETRON HCL 4 MG PO TABS: 4.0000 mg | ORAL_TABLET | ORAL | 0 refills | Status: DC

## 2023-05-27 NOTE — Progress Notes (Signed)
No egg or soy allergy known to patient  No issues known to pt with past sedation with any surgeries or procedures Patient denies ever being told they had issues or difficulty with intubation  No FH of Malignant Hyperthermia Pt is not on diet pills Pt is not on  home 02  Pt is not on blood thinners  Pt has issues with constipation; takes miralax No A fib or A flutter Have any cardiac testing pending--no Pt instructed to use Singlecare.com or GoodRx for a price reduction on prep  Patient's chart reviewed by Cathlyn Parsons CNRA prior to previsit and patient appropriate for the [facility].  Previsit completed and red dot placed by patient's name on their procedure day (on provider's schedule).   Can ambulate idependently

## 2023-06-03 DIAGNOSIS — N62 Hypertrophy of breast: Secondary | ICD-10-CM | POA: Diagnosis not present

## 2023-06-05 ENCOUNTER — Other Ambulatory Visit: Payer: Self-pay

## 2023-06-05 ENCOUNTER — Other Ambulatory Visit (HOSPITAL_COMMUNITY): Payer: Self-pay

## 2023-06-09 ENCOUNTER — Encounter: Payer: BC Managed Care – PPO | Admitting: Internal Medicine

## 2023-06-09 ENCOUNTER — Other Ambulatory Visit: Payer: BC Managed Care – PPO

## 2023-06-09 ENCOUNTER — Other Ambulatory Visit: Payer: Self-pay

## 2023-06-09 ENCOUNTER — Encounter: Payer: Self-pay | Admitting: Internal Medicine

## 2023-06-09 ENCOUNTER — Ambulatory Visit: Payer: BC Managed Care – PPO | Admitting: Internal Medicine

## 2023-06-09 VITALS — BP 123/69 | HR 91 | Temp 98.4°F | Resp 12 | Ht 64.0 in | Wt 212.0 lb

## 2023-06-09 DIAGNOSIS — K50111 Crohn's disease of large intestine with rectal bleeding: Secondary | ICD-10-CM

## 2023-06-09 DIAGNOSIS — D120 Benign neoplasm of cecum: Secondary | ICD-10-CM

## 2023-06-09 DIAGNOSIS — K50119 Crohn's disease of large intestine with unspecified complications: Secondary | ICD-10-CM

## 2023-06-09 DIAGNOSIS — K626 Ulcer of anus and rectum: Secondary | ICD-10-CM

## 2023-06-09 MED ORDER — SODIUM CHLORIDE 0.9 % IV SOLN: 500.0000 mL | Freq: Once | INTRAVENOUS | Status: DC

## 2023-06-09 NOTE — Progress Notes (Signed)
Called to room to assist during endoscopic procedure.  Patient ID and intended procedure confirmed with present staff. Received instructions for my participation in the procedure from the performing physician.  

## 2023-06-09 NOTE — Op Note (Signed)
[facility] Patient Name: Nancy Reeves Procedure Date: 06/09/2023 1:11 PM MRN: [id number] Endoscopist: Beverley Fiedler , MD, [id number] Age: 42 Referring MD:  Date of Birth: 04-Aug-1981 Gender: Female Account #: [id number] Procedure:                Colonoscopy Indications:              Assess therapeutic response to therapy of Crohn's                            disease of the colon and perianus; left-sided                            (descending, sigmoid and rectum) Crohn's disease                            with perianal involvement; last exam January 2023;                            current therapy Humira 40 mg every 14 days and                            azathioprine 50 mg daily. Previous antibodies to                            infliximab. Medicines:                Monitored Anesthesia Care Procedure:                Pre-Anesthesia Assessment:                           - Prior to the procedure, a History and Physical                            was performed, and patient medications and                            allergies were reviewed. The patient's tolerance of                            previous anesthesia was also reviewed. The risks                            and benefits of the procedure and the sedation                            options and risks were discussed with the patient.                            All questions were answered, and informed consent                            was obtained. Prior Anticoagulants: The patient has  taken no anticoagulant or antiplatelet agents. ASA                            Grade Assessment: II - A patient with mild systemic                            disease. After reviewing the risks and benefits,                            the patient was deemed in satisfactory condition to                            undergo the procedure.                           After obtaining informed consent, the colonoscope                             was passed under direct vision. Throughout the                            procedure, the patient's blood pressure, pulse, and                            oxygen saturations were monitored continuously. The                            CF [id number] #[id number] was introduced through the anus                            and advanced to the cecum, identified by                            appendiceal orifice and ileocecal valve. The                            colonoscopy was performed without difficulty. The                            patient tolerated the procedure well. The quality                            of the bowel preparation was good. The ileocecal                            valve, appendiceal orifice, and rectum were                            photographed. Scope In: 1:38:09 PM Scope Out: 1:52:09 PM Scope Withdrawal Time: 0 hours 10 minutes 9 seconds  Total Procedure Duration: 0 hours 14 minutes 0 seconds  Findings:                 Skin tags were found on perianal exam. Nodularity  in the anal canal without stenosis. Perianal skin                            changes much improved.                           A 2 mm polyp was found in the cecum. The polyp was                            sessile. The polyp was removed with a cold snare.                            Resection and retrieval were complete.                           The Simple Endoscopic Score for Crohn's Disease was                            determined based on the endoscopic appearance of                            the mucosa in the following segments:                           - Ileum: Findings include no ulcers present, no                            ulcerated surfaces, no affected surfaces and no                            narrowings. Segment score: 0.                           - Right Colon: Findings include no ulcers present,                            no ulcerated surfaces, no  affected surfaces and no                            narrowings. Segment score: 0.                           - Transverse Colon: Findings include no ulcers                            present, no ulcerated surfaces, no affected                            surfaces and no narrowings. Segment score: 0.                           - Left Colon: Findings include no ulcers present,  no ulcerated surfaces, no affected surfaces and no                            narrowings. Segment score: 0.                           - Rectum: Findings include no ulcers present, no                            ulcerated surfaces, no affected surfaces and no                            narrowings. Segment score: 0.                           - Total SES-CD aggregate score: 0. Biopsies were                            obtained with cold forceps for evaluation of                            inflammatory bowel disease in a targeted manner at                            the anus, in the rectum, in the sigmoid colon and                            in the descending colon.                           A localized area of mildly nodular, pseudopolypoid                            and ulcerated mucosa was found at the anus.                            Biopsies were taken with a cold forceps for                            histology. Complications:            No immediate complications. Estimated Blood Loss:     Estimated blood loss was minimal. Impression:               - Improved perianal skin breakdown related to                            Crohn's disease.                           - Perianal skin tags found on perianal exam and                            nodular mucosa in the anal canal without stenosis.                           -  One 2 mm polyp in the cecum, removed with a cold                            snare. Resected and retrieved.                           - Simple Endoscopic Score for Crohn's Disease: 0,                             mucosal inflammatory changes secondary to Crohn's                            disease with colonic involvement. Endoscopic                            remission in the left colon and proximal/mid rectum                            (biopsied).                           - Nodular, pseudopolypoid and ulcerated mucosa at                            the anus. These changes are now mild and improved                            compared to Jan 2023. Biopsied.                           - Biopsies were obtained at the anus, in the                            rectum, in the sigmoid colon and in the descending                            colon. Recommendation:           - Patient has a contact number available for                            emergencies. The signs and symptoms of potential                            delayed complications were discussed with the                            patient. Return to normal activities tomorrow.                            Written discharge instructions were provided to the                            patient.                           -  Resume previous diet.                           - Continue present medications.                           - Humira level at trough (please come to lab for                            this blood test the day before your next Humira                            dose).                           - Await pathology results.                           - Next available, non-urgent office visit with me.                           - Repeat colonoscopy is recommended. The                            colonoscopy date will be determined after pathology                            results from today's exam become available for                            review. Beverley Fiedler, MD 06/09/2023 2:04:44 PM This report has been signed electronically.

## 2023-06-09 NOTE — Patient Instructions (Signed)
YOU HAD AN ENDOSCOPIC PROCEDURE TODAY AT THE [facility]:   Refer to the procedure report that was given to you for any specific questions about what was found during the examination.  If the procedure report does not answer your questions, please call your gastroenterologist to clarify.  If you requested that your care partner not be given the details of your procedure findings, then the procedure report has been included in a sealed envelope for you to review at your convenience later.  **Handout given on polyps**  YOU SHOULD EXPECT: Some feelings of bloating in the abdomen. Passage of more gas than usual.  Walking can help get rid of the air that was put into your GI tract during the procedure and reduce the bloating. If you had a lower endoscopy (such as a colonoscopy or flexible sigmoidoscopy) you may notice spotting of blood in your stool or on the toilet paper. If you underwent a bowel prep for your procedure, you may not have a normal bowel movement for a few days.  Please Note:  You might notice some irritation and congestion in your nose or some drainage.  This is from the oxygen used during your procedure.  There is no need for concern and it should clear up in a day or so.  SYMPTOMS TO REPORT IMMEDIATELY:  Following lower endoscopy (colonoscopy or flexible sigmoidoscopy):  Excessive amounts of blood in the stool  Significant tenderness or worsening of abdominal pains  Swelling of the abdomen that is new, acute  Fever of 100F or higher  For urgent or emergent issues, a gastroenterologist can be reached at any hour by calling [phone]. Do not use MyChart messaging for urgent concerns.    DIET:  We do recommend a small meal at first, but then you may proceed to your regular diet.  Drink plenty of fluids but you should avoid alcoholic beverages for 24 hours.  ACTIVITY:  You should plan to take it easy for the rest of today and you should NOT DRIVE or use heavy  machinery until tomorrow (because of the sedation medicines used during the test).    FOLLOW UP: Our staff will call the number listed on your records the next business day following your procedure.  We will call around 7:15- 8:00 am to check on you and address any questions or concerns that you may have regarding the information given to you following your procedure. If we do not reach you, we will leave a message.     If any biopsies were taken you will be contacted by phone or by letter within the next 1-3 weeks.  Please call us at [phone] if you have not heard about the biopsies in 3 weeks.    SIGNATURES/CONFIDENTIALITY: You and/or your care partner have signed paperwork which will be entered into your electronic medical record.  These signatures attest to the fact that that the information above on your After Visit Summary has been reviewed and is understood.  Full responsibility of the confidentiality of this discharge information lies with you and/or your care-partner.  

## 2023-06-09 NOTE — Progress Notes (Signed)
GASTROENTEROLOGY PROCEDURE H&P NOTE   Primary Care Physician: Rebecka Apley, NP    Reason for Procedure:   Crohn's disease  Plan:    colonoscopy  Patient is appropriate for endoscopic procedure(s) in the ambulatory ([facility]) setting.  The nature of the procedure, as well as the risks, benefits, and alternatives were carefully and thoroughly reviewed with the patient. Ample time for discussion and questions allowed. The patient understood, was satisfied, and agreed to proceed.     HPI: Nancy Reeves is a 42 y.o. female who presents for colonoscopy.  Medical history as below.  Tolerated the prep.  No recent chest pain or shortness of breath.  No abdominal pain today.  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 Authorizing Provider  azaTHIOprine (IMURAN) 50 MG tablet TAKE 1 TABLET(50 MG) BY MOUTH DAILY 06/03/22  Yes Thaer Miyoshi, Carie Caddy, MD  famotidine (PEPCID) 40 MG tablet Take 40 mg by mouth daily.    Yes [provider]  ondansetron (ZOFRAN) 4 MG tablet Take 1 tablet (4 mg total) by mouth as directed. Take one Zofran 4 mg tablet 30-60 minutes before each colonoscopy prep dose 05/27/23  Yes English Tomer, Carie Caddy, MD  adalimumab (HUMIRA, 2 PEN,) 40 MG/0.4ML pen Inject 40 mg into the skin every 14 (fourteen) days. 02/11/23   Derral Colucci, Carie Caddy, MD  Adalimumab (HUMIRA-CD/UC/HS STARTER) 80 MG/0.8ML PNKT Inject 160mg  (2 pens)  subcutaneously on day 1. Then inject 80mg  (1 pen) subcutaneously on day 15. Patient not taking: Reported on 05/27/2023 02/11/23   Beverley Fiedler, MD  albuterol (VENTOLIN HFA) 108 (90 Base) MCG/ACT inhaler Inhale 1 puff into the lungs every 4 (four) hours. 06/06/20   Dione Booze, MD  chlorhexidine (PERIDEX) 0.12 % solution SMARTSIG:By Mouth 01/09/23   [provider]  ibuprofen (ADVIL) 200 MG tablet Take by mouth.    [provider]  ondansetron (ZOFRAN) 4 MG tablet Take 1 tablet (4 mg total) by mouth every 6 (six) hours as needed for nausea. 12/26/21   Jakson Delpilar, Carie Caddy, MD  polyethylene glycol (MIRALAX / GLYCOLAX) 17 g packet Take 17 g by mouth daily as needed.    [provider]  tiZANidine (ZANAFLEX) 4 MG capsule Take 4 mg by mouth daily. Patient not taking: Reported on 05/27/2023    [provider]  traMADol (ULTRAM) 50 MG tablet Take by mouth. Patient not taking: Reported on 05/27/2023    [provider]  DULoxetine (CYMBALTA) 60 MG capsule Take 60 mg by mouth daily.  07/19/19  [provider]  gabapentin (NEURONTIN) 300 MG capsule Take 300 mg by mouth 3 (three) times daily.  07/19/19  [provider]    Current Outpatient Medications  Medication Sig Dispense Refill   azaTHIOprine (IMURAN) 50 MG tablet TAKE 1 TABLET(50 MG) BY MOUTH DAILY 30 tablet 3   famotidine (PEPCID)  40 MG tablet Take 40 mg by mouth daily.      ondansetron (ZOFRAN) 4 MG tablet Take 1 tablet (4 mg total) by mouth as directed. Take one Zofran 4 mg tablet 30-60 minutes before each colonoscopy prep dose 2 tablet 0   adalimumab (HUMIRA, 2 PEN,) 40 MG/0.4ML pen Inject 40 mg into the skin every 14 (fourteen) days. 2 each 11   Adalimumab (HUMIRA-CD/UC/HS STARTER) 80 MG/0.8ML PNKT Inject 160mg  (2 pens) subcutaneously on day 1. Then inject 80mg  (1 pen) subcutaneously on day 15. (Patient not taking: Reported on 05/27/2023) 3 each 0   albuterol (VENTOLIN HFA) 108 (90 Base) MCG/ACT  inhaler Inhale 1 puff into the lungs every 4 (four) hours. 18 g 0   chlorhexidine (PERIDEX) 0.12 % solution SMARTSIG:By Mouth     ibuprofen (ADVIL) 200 MG tablet Take by mouth.     ondansetron (ZOFRAN) 4 MG tablet Take 1 tablet (4 mg total) by mouth every 6 (six) hours as needed for nausea. 30 tablet 2   polyethylene glycol (MIRALAX / GLYCOLAX) 17 g packet Take 17 g by mouth daily as needed.     tiZANidine (ZANAFLEX) 4 MG capsule Take 4 mg by mouth daily. (Patient not taking: Reported on 05/27/2023)     traMADol (ULTRAM) 50 MG tablet Take by mouth. (Patient not taking: Reported on 05/27/2023)     Current Facility-Administered Medications  Medication Dose Route Frequency Provider Last Rate Last Admin   0.9 %  sodium chloride infusion  500 mL Intravenous Once Sonni Barse, Carie Caddy, MD        Allergies as of 06/09/2023 - Review Complete 06/09/2023  Allergen Reaction Noted   Sumatriptan Anaphylaxis 05/13/2023   Triptans Anaphylaxis 01/18/2018   Eletriptan hydrobromide Swelling 08/16/2015    Family History  Problem Relation Age of Onset   Thyroid disease Mother    Migraines Mother    Colon cancer Neg Hx    Esophageal cancer Neg Hx    Rectal cancer Neg Hx    Stomach cancer Neg Hx    Colon polyps Neg Hx     Social History   Socioeconomic History   Marital status: Married    Spouse name: Not on file   Number of children: 0   Years of education: Not on file   Highest education level: Not on file  Occupational History   Occupation: Data processing manager  Tobacco Use   Smoking status: Never   Smokeless tobacco: Never  Vaping Use   Vaping status: Never Used  Substance and Sexual Activity   Alcohol use: Yes    Comment: occasional   Drug use: No   Sexual activity: Yes    Birth control/protection: None  Other Topics Concern   Not on file  Social History Narrative   Married   Occasional alcohol never tobacco no drug use   Social Determinants of Health   Financial Resource Strain: Low Risk   (09/12/2021)   Received from [facility], [facility]   Overall Financial Resource Strain (CARDIA)    Difficulty of Paying Living Expenses: Not very hard  Food Insecurity: No Food Insecurity (11/26/2021)   Received from [facility], [facility]   Hunger Vital Sign    Worried About Running Out of Food in the Last Year: Never true    Ran Out of Food in the Last Year: Never true  Transportation Needs: No Transportation Needs (09/12/2021)   Received from [facility], [facility]   [facility] - Transportation  Lack of Transportation (Medical): No    Lack of Transportation (Non-Medical): No  Physical Activity: Insufficiently Active (09/12/2021)   Received from [facility], [facility]   Exercise Vital Sign    Days of Exercise per Week: 2 days    Minutes of Exercise per Session: 30 min  Stress: Stress Concern Present (09/12/2021)   Received from [facility] - Occupational Stress Questionnaire    Feeling of Stress : Rather much  Social Connections: Unknown (03/20/2022)   Received from [facility], [facility]   Social Network    Social Network: Not on file  Intimate Partner Violence: Unknown (02/22/2022)   Received from [facility], [facility]   HITS    Physically Hurt: Not on file    Insult or Talk Down To: Not on file    Threaten Physical Harm: Not on file    Scream or Curse: Not on file    Physical Exam: Vital signs in last 24 hours: @BP  106/61   Pulse 88   Temp 98.4 F (36.9 C) (Temporal)   Ht 5\' 4"  (1.626 m)   Wt 212 lb (96.2 kg)   SpO2 98%   BMI 36.39 kg/m  GEN: NAD EYE: Sclerae anicteric ENT: MMM CV: Non-tachycardic Pulm: CTA b/l GI: Soft, NT/ND NEURO:  Alert & Oriented x 3   Erick Blinks, MD [facility]  06/09/2023 1:29 PM

## 2023-06-09 NOTE — Progress Notes (Signed)
Sedate, gd SR, tolerated procedure well, VSS, report to RN 

## 2023-06-09 NOTE — Progress Notes (Signed)
VS completed by DT.  Pt's states no medical or surgical changes since previsit or office visit.  

## 2023-06-10 ENCOUNTER — Other Ambulatory Visit (HOSPITAL_COMMUNITY): Payer: Self-pay

## 2023-06-10 ENCOUNTER — Telehealth: Payer: Self-pay | Admitting: *Deleted

## 2023-06-10 NOTE — Telephone Encounter (Signed)
Busy signal reached ,unable to leave mess on f/u call

## 2023-06-13 ENCOUNTER — Encounter: Payer: Self-pay | Admitting: Internal Medicine

## 2023-06-16 ENCOUNTER — Other Ambulatory Visit: Payer: Self-pay | Admitting: Internal Medicine

## 2023-06-16 DIAGNOSIS — K50119 Crohn's disease of large intestine with unspecified complications: Secondary | ICD-10-CM

## 2023-07-02 ENCOUNTER — Telehealth: Payer: Self-pay | Admitting: Internal Medicine

## 2023-07-02 ENCOUNTER — Other Ambulatory Visit (HOSPITAL_COMMUNITY): Payer: Self-pay

## 2023-07-02 DIAGNOSIS — R110 Nausea: Secondary | ICD-10-CM

## 2023-07-02 MED ORDER — ONDANSETRON HCL 4 MG PO TABS: 4.0000 mg | ORAL_TABLET | Freq: Four times a day (QID) | ORAL | 2 refills | Status: AC | PRN

## 2023-07-02 NOTE — Telephone Encounter (Signed)
PT is calling to get a refill on Zofran. It needs to be sent to [facility] on [address]

## 2023-07-02 NOTE — Telephone Encounter (Signed)
Prescription sent to patient's pharmacy.

## 2023-07-17 ENCOUNTER — Other Ambulatory Visit: Payer: Self-pay

## 2023-07-28 ENCOUNTER — Other Ambulatory Visit (HOSPITAL_COMMUNITY): Payer: Self-pay

## 2023-07-28 ENCOUNTER — Other Ambulatory Visit: Payer: Self-pay

## 2023-07-31 ENCOUNTER — Other Ambulatory Visit: Payer: Self-pay

## 2023-08-01 ENCOUNTER — Other Ambulatory Visit: Payer: Self-pay

## 2023-08-04 ENCOUNTER — Other Ambulatory Visit (HOSPITAL_COMMUNITY): Payer: Self-pay

## 2023-08-05 ENCOUNTER — Other Ambulatory Visit (HOSPITAL_COMMUNITY): Payer: Self-pay

## 2023-08-06 ENCOUNTER — Other Ambulatory Visit (HOSPITAL_COMMUNITY): Payer: Self-pay

## 2023-08-07 ENCOUNTER — Other Ambulatory Visit: Payer: Self-pay

## 2023-08-12 ENCOUNTER — Other Ambulatory Visit: Payer: Self-pay

## 2023-08-14 ENCOUNTER — Other Ambulatory Visit (HOSPITAL_COMMUNITY): Payer: Self-pay

## 2023-08-21 ENCOUNTER — Other Ambulatory Visit: Payer: Self-pay

## 2023-08-21 ENCOUNTER — Other Ambulatory Visit: Payer: Self-pay | Admitting: Internal Medicine

## 2023-09-02 ENCOUNTER — Other Ambulatory Visit: Payer: BC Managed Care – PPO

## 2023-09-02 DIAGNOSIS — K50119 Crohn's disease of large intestine with unspecified complications: Secondary | ICD-10-CM

## 2023-09-03 ENCOUNTER — Other Ambulatory Visit: Payer: Self-pay

## 2023-09-03 NOTE — Progress Notes (Signed)
Specialty Pharmacy Refill Coordination Note  Nancy Reeves is a 42 y.o. female contacted today regarding refills of specialty medication(s) Adalimumab   Patient requested Delivery   Delivery date: 09/05/23   Verified address: [address] [place] [address]   Medication will be filled on 09/04/23.

## 2023-09-13 LAB — SERIAL MONITORING

## 2023-09-15 LAB — ADALIMUMAB+AB (SERIAL MONITOR)
Adalimumab Drug Level: 4.3 ug/mL
Anti-Adalimumab Antibody: <25 ng/mL

## 2023-09-16 ENCOUNTER — Other Ambulatory Visit: Payer: Self-pay

## 2023-09-16 ENCOUNTER — Telehealth: Payer: Self-pay

## 2023-09-16 ENCOUNTER — Other Ambulatory Visit: Payer: Self-pay | Admitting: Pharmacy Technician

## 2023-09-16 ENCOUNTER — Other Ambulatory Visit (HOSPITAL_COMMUNITY): Payer: Self-pay

## 2023-09-16 MED ORDER — HUMIRA (2 PEN) 40 MG/0.4ML ~~LOC~~ AJKT
40.0000 mg | AUTO-INJECTOR | SUBCUTANEOUS | 11 refills | Status: DC
  Filled 2023-09-18: qty 4, 28d supply, fill #0
  Filled 2023-10-24 – 2023-10-29 (×4): qty 4, 28d supply, fill #1
  Filled 2023-11-20: qty 4, 28d supply, fill #2
  Filled 2023-12-18: qty 4, 28d supply, fill #3

## 2023-09-16 NOTE — Telephone Encounter (Signed)
Pts humira changed to 40mg  every 7 days. Will need new PA.

## 2023-09-16 NOTE — Progress Notes (Signed)
New PA will be needed for the weekly dosing.

## 2023-09-17 ENCOUNTER — Other Ambulatory Visit (HOSPITAL_COMMUNITY): Payer: Self-pay

## 2023-09-17 ENCOUNTER — Telehealth: Payer: Self-pay | Admitting: Pharmacy Technician

## 2023-09-17 NOTE — Telephone Encounter (Signed)
Pharmacy Patient Advocate Encounter   Received notification from CoverMyMeds that prior authorization for HUMIRA 40MG  is required/requested.   Insurance verification completed.   The patient is insured through [facility] .   Per test claim: PA required; PA submitted to above mentioned insurance via CoverMyMeds Key/confirmation #/EOC [id number] Status is pending

## 2023-09-17 NOTE — Telephone Encounter (Signed)
Pharmacy Patient Advocate Encounter  Received notification from [facility] that Prior Authorization for HUMIRA 40MG  has been APPROVED from 10.30.24 to 10.30.25. Ran test claim, Copay is $0. This test claim was processed through [facility]- copay amounts may vary at other pharmacies due to pharmacy/plan contracts, or as the patient moves through the different stages of their insurance plan.   PA #/Case ID/Reference #:   This is with copay card attached

## 2023-09-18 ENCOUNTER — Other Ambulatory Visit: Payer: Self-pay

## 2023-09-18 ENCOUNTER — Other Ambulatory Visit (HOSPITAL_COMMUNITY): Payer: Self-pay

## 2023-09-18 NOTE — Telephone Encounter (Signed)
Noted  

## 2023-09-18 NOTE — Telephone Encounter (Signed)
Yes

## 2023-09-18 NOTE — Progress Notes (Signed)
Specialty Pharmacy Refill Coordination Note  Nancy Reeves is a 42 y.o. female contacted today regarding refills of specialty medication(s) Adalimumab Patient was changed to weekly dosing from biweekly dosing.  Patient requested Pickup at [facility] [place] date: 09/19/23   Medication will be filled on 09/18/23.

## 2023-09-18 NOTE — Telephone Encounter (Signed)
Pt notified via mychart

## 2023-09-18 NOTE — Telephone Encounter (Signed)
This PA has been approved. [facility] has been notified.

## 2023-09-18 NOTE — Telephone Encounter (Signed)
This is for the every 7 day dosing correct?

## 2023-09-19 ENCOUNTER — Other Ambulatory Visit (HOSPITAL_COMMUNITY): Payer: Self-pay

## 2023-09-22 ENCOUNTER — Other Ambulatory Visit: Payer: Self-pay

## 2023-09-25 ENCOUNTER — Telehealth: Payer: Self-pay | Admitting: Internal Medicine

## 2023-09-25 NOTE — Telephone Encounter (Signed)
Patient came into office to speak with a nurse. She states she is not getting any better and continues to have issues, including changes in her stool and bloody diarrhea. Please advise.

## 2023-09-26 ENCOUNTER — Telehealth: Payer: Self-pay | Admitting: Internal Medicine

## 2023-09-26 ENCOUNTER — Other Ambulatory Visit: Payer: Self-pay

## 2023-09-26 ENCOUNTER — Other Ambulatory Visit (INDEPENDENT_AMBULATORY_CARE_PROVIDER_SITE_OTHER): Payer: BC Managed Care – PPO

## 2023-09-26 DIAGNOSIS — R110 Nausea: Secondary | ICD-10-CM | POA: Diagnosis not present

## 2023-09-26 DIAGNOSIS — K50119 Crohn's disease of large intestine with unspecified complications: Secondary | ICD-10-CM

## 2023-09-26 DIAGNOSIS — K50111 Crohn's disease of large intestine with rectal bleeding: Secondary | ICD-10-CM

## 2023-09-26 LAB — CBC WITH DIFFERENTIAL/PLATELET
Basophils Absolute: 0 10*3/uL (ref 0.0–0.1)
Basophils Relative: 0.4 % (ref 0.0–3.0)
Eosinophils Absolute: 0.2 10*3/uL (ref 0.0–0.7)
Eosinophils Relative: 2.8 % (ref 0.0–5.0)
HCT: 37.5 % (ref 36.0–46.0)
Hemoglobin: 12.1 g/dL (ref 12.0–15.0)
Lymphocytes Relative: 24.1 % (ref 12.0–46.0)
Lymphs Abs: 1.6 10*3/uL (ref 0.7–4.0)
MCHC: 32.3 g/dL (ref 30.0–36.0)
MCV: 77 fL — ABNORMAL LOW (ref 78.0–100.0)
Monocytes Absolute: 0.8 10*3/uL (ref 0.1–1.0)
Monocytes Relative: 11.2 % (ref 3.0–12.0)
Neutro Abs: 4.1 10*3/uL (ref 1.4–7.7)
Neutrophils Relative %: 61.5 % (ref 43.0–77.0)
Platelets: 294 10*3/uL (ref 150.0–400.0)
RBC: 4.86 Mil/uL (ref 3.87–5.11)
RDW: 15.5 % (ref 11.5–15.5)
WBC: 6.7 10*3/uL (ref 4.0–10.5)

## 2023-09-26 LAB — COMPREHENSIVE METABOLIC PANEL
ALT: 14 U/L (ref 0–35)
AST: 16 U/L (ref 0–37)
Albumin: 3.9 g/dL (ref 3.5–5.2)
Alkaline Phosphatase: 54 U/L (ref 39–117)
BUN: 11 mg/dL (ref 6–23)
CO2: 29 meq/L (ref 19–32)
Calcium: 9.3 mg/dL (ref 8.4–10.5)
Chloride: 104 meq/L (ref 96–112)
Creatinine, Ser: 0.92 mg/dL (ref 0.40–1.20)
GFR: 76.58 mL/min (ref >60.00)
Glucose, Bld: 94 mg/dL (ref 70–99)
Potassium: 4.3 meq/L (ref 3.5–5.1)
Sodium: 137 meq/L (ref 135–145)
Total Bilirubin: 0.4 mg/dL (ref 0.2–1.2)
Total Protein: 8.5 g/dL — ABNORMAL HIGH (ref 6.0–8.3)

## 2023-09-26 MED ORDER — PREDNISONE 10 MG PO TABS: ORAL_TABLET | ORAL | 0 refills | Status: DC

## 2023-09-26 NOTE — Telephone Encounter (Signed)
Patient reports she has been unable to work at all. Her symptoms have been getting progressively worse with frequent bathroom breaks. She needs the disability forms to state she cannot work as of currently. Can you fill out the forms again for the patient taking her out work Dr. Rhea Belton?

## 2023-09-26 NOTE — Telephone Encounter (Signed)
Inbound call from patient wishing to speak about forms that were filled out today. 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 Patient states she is unable to work a schedule because she does not know when she will be having complications. States her case has been closed with the sanitary work recommendations and disability has been ended. Patient is requesting a call back to discuss further. Patient transfer to speak with Marchelle Folks. Please advise, thank you.

## 2023-09-26 NOTE — Telephone Encounter (Signed)
I recommend MRI pelvis with contrast; history of perianal Crohn's disease; rule out abscess and fistula Continue Humira 40 mg weekly CBC, CMP, CRP and fecal calprotectin, c diff pcr Once these are submitted we can begin a prednisone taper 30 mg daily x 7 days and down by 5 mg every 7 days until off She needs to be seen by me or APP in 3 to 4 weeks, sooner if available

## 2023-09-29 NOTE — Telephone Encounter (Signed)
Faxed completed form to [employer]. Forms scanned under media.

## 2023-09-29 NOTE — Telephone Encounter (Signed)
Paperwork completed [employer]

## 2023-09-30 ENCOUNTER — Encounter: Payer: Self-pay | Admitting: Internal Medicine

## 2023-09-30 ENCOUNTER — Other Ambulatory Visit: Payer: BC Managed Care – PPO

## 2023-09-30 DIAGNOSIS — K50119 Crohn's disease of large intestine with unspecified complications: Secondary | ICD-10-CM | POA: Diagnosis not present

## 2023-09-30 DIAGNOSIS — K50111 Crohn's disease of large intestine with rectal bleeding: Secondary | ICD-10-CM | POA: Diagnosis not present

## 2023-09-30 DIAGNOSIS — R110 Nausea: Secondary | ICD-10-CM

## 2023-09-30 DIAGNOSIS — R079 Chest pain, unspecified: Secondary | ICD-10-CM | POA: Diagnosis not present

## 2023-09-30 DIAGNOSIS — M940 Chondrocostal junction syndrome [Tietze]: Secondary | ICD-10-CM | POA: Diagnosis not present

## 2023-09-30 NOTE — Telephone Encounter (Signed)
Disability appeal letter written and routed to [facility]

## 2023-09-30 NOTE — Telephone Encounter (Signed)
Patient came by our office today stating she has been once again denied disability coverage even after we resubmitted the corrected forms. Patient is requesting an written appeal letter from Dr. Rhea Belton to be mailed to the [employer]. I put the paper work in your office Dr. Rhea Belton.

## 2023-09-30 NOTE — Telephone Encounter (Signed)
Disability letter written and routed to you

## 2023-10-01 LAB — CLOSTRIDIUM DIFFICILE BY PCR: Toxigenic C. Difficile by PCR: NEGATIVE

## 2023-10-01 NOTE — Telephone Encounter (Signed)
Printed out disability letter and mailed to the [employer]. They did not provide a fax number to fax the letter. Sent MyChart message to patient notifying her that we mailed the letter.

## 2023-10-02 ENCOUNTER — Ambulatory Visit (HOSPITAL_COMMUNITY)
Admission: RE | Admit: 2023-10-02 | Discharge: 2023-10-02 | Disposition: A | Discharge status: Home / Self Care | Payer: BC Managed Care – PPO | Source: Ambulatory Visit | Attending: Internal Medicine | Admitting: Internal Medicine

## 2023-10-02 DIAGNOSIS — K50119 Crohn's disease of large intestine with unspecified complications: Secondary | ICD-10-CM | POA: Diagnosis not present

## 2023-10-02 DIAGNOSIS — R110 Nausea: Secondary | ICD-10-CM | POA: Diagnosis not present

## 2023-10-02 DIAGNOSIS — K5090 Crohn's disease, unspecified, without complications: Secondary | ICD-10-CM | POA: Diagnosis not present

## 2023-10-02 DIAGNOSIS — M7989 Other specified soft tissue disorders: Secondary | ICD-10-CM | POA: Diagnosis not present

## 2023-10-02 DIAGNOSIS — K50111 Crohn's disease of large intestine with rectal bleeding: Secondary | ICD-10-CM | POA: Insufficient documentation

## 2023-10-02 DIAGNOSIS — K6289 Other specified diseases of anus and rectum: Secondary | ICD-10-CM | POA: Diagnosis not present

## 2023-10-02 LAB — CALPROTECTIN, FECAL: Calprotectin, Fecal: 1520 ug/g — ABNORMAL HIGH (ref 0–120)

## 2023-10-02 MED ORDER — GADOBUTROL 1 MMOL/ML IV SOLN
10.0000 mL | Freq: Once | INTRAVENOUS | Status: AC | PRN
  Administered 2023-10-02: 10 mL via INTRAVENOUS

## 2023-10-08 ENCOUNTER — Other Ambulatory Visit: Payer: Self-pay

## 2023-10-10 ENCOUNTER — Other Ambulatory Visit: Payer: Self-pay

## 2023-10-10 MED ORDER — METRONIDAZOLE 250 MG PO TABS: 250.0000 mg | ORAL_TABLET | Freq: Three times a day (TID) | ORAL | 0 refills | Status: DC

## 2023-10-10 MED ORDER — CIPROFLOXACIN HCL 500 MG PO TABS: 500.0000 mg | ORAL_TABLET | Freq: Two times a day (BID) | ORAL | 0 refills | Status: DC

## 2023-10-13 ENCOUNTER — Other Ambulatory Visit: Payer: Self-pay

## 2023-10-21 ENCOUNTER — Ambulatory Visit: Payer: Self-pay | Admitting: General Surgery

## 2023-10-21 DIAGNOSIS — K50113 Crohn's disease of large intestine with fistula: Secondary | ICD-10-CM | POA: Diagnosis not present

## 2023-10-21 DIAGNOSIS — K603 Anal fistula, unspecified: Secondary | ICD-10-CM | POA: Diagnosis not present

## 2023-10-21 NOTE — H&P (Signed)
REFERRING PHYSICIAN:  Pyrtle, Burnard Leigh, MD  PROVIDER:  Elenora Gamma, MD  MRN: [id number] DOB: August 12, 1981 DATE OF ENCOUNTER: 10/21/2023  Subjective   Chief Complaint: New Consultation     History of Present Illness: Nancy Reeves is a 42 y.o. female who is seen today as an office consultation at the request of Dr. Rhea Belton for evaluation of New Consultation .  42 year old female with Crohn's disease treated with Humira and Imuran, diagnosed in 2023.  Last colonoscopy performed in July 2024.  This showed improvement in her perianal disease with minimal endoluminal disease.  She has continued to have issues with bloody diarrhea and pain.  MRI was performed in early November.  This showed diffuse wall thickening and mucosal hyperenhancement of the rectum and anus with a left posterior anal fissure or possible fistula track which is similar to prior examination.  She was treated with Cipro and Flagyl and referred here for further evaluation.   Review of Systems: A complete review of systems was obtained from the patient.  I have reviewed this information and discussed as appropriate with the patient.  See HPI as well for other ROS.   Medical History: Past Medical History:  Diagnosis Date   GERD (gastroesophageal reflux disease)     There is no problem list on file for this patient.   Past Surgical History:  Procedure Laterality Date   Hemorrhoid removal       Allergies  Allergen Reactions   Triptans-5-Ht1 Antimigraine Agents Anaphylaxis, Headache, Nausea, Shortness Of Breath and Vomiting    Current Outpatient Medications on File Prior to Visit  Medication Sig Dispense Refill   albuterol MDI, PROVENTIL, VENTOLIN, PROAIR, HFA 90 mcg/actuation inhaler Ventolin HFA 90 mcg/actuation aerosol inhaler INHALE 1 PUFF INTO THE LUNGS EVERY 4 HOURS     azaTHIOprine (IMURAN) 50 mg tablet Take 50 mg by mouth once daily     ergocalciferol, vitamin D2, 1,250 mcg (50,000 unit)  capsule      HUMIRA,CF, PEN 80 mg/0.8 mL PnKt Inject 160mg  (2 pens) subcutaneously on day 1. Then inject 80mg  (1 pen) subcutaneously on day 15.     ibuprofen (MOTRIN) 200 MG tablet Take 200 mg by mouth every 6 (six) hours as needed     metroNIDAZOLE (FLAGYL) 250 MG tablet Take 250 mg by mouth     ondansetron (ZOFRAN) 4 MG tablet Take 4 mg by mouth every 6 (six) hours as needed     No current facility-administered medications on file prior to visit.    History reviewed. No pertinent family history.   Social History   Tobacco Use  Smoking Status Never  Smokeless Tobacco Never     Social History   Socioeconomic History   Marital status: Married  Tobacco Use   Smoking status: Never   Smokeless tobacco: Never  Substance and Sexual Activity   Alcohol use: Yes   Drug use: Never   Social Drivers of Corporate investment banker Strain: Low Risk  (09/12/2021)   Received from [facility]   Overall Financial Resource Strain (CARDIA)    Difficulty of Paying Living Expenses: Not very hard  Food Insecurity: No Food Insecurity (11/26/2021)   Received from [facility]   Hunger Vital Sign    Worried About Running Out of Food in the Last Year: Never true    Ran Out of Food in the Last Year: Never true  Transportation Needs: No Transportation Needs (09/12/2021)   Received from [facility] -  Administrator, Civil Service (Medical): No    Lack of Transportation (Non-Medical): No  Physical Activity: Insufficiently Active (09/12/2021)   Received from [facility]   Exercise Vital Sign    Days of Exercise per Week: 2 days    Minutes of Exercise per Session: 30 min  Stress: Stress Concern Present (09/12/2021)   Received from [facility] - Occupational Stress Questionnaire    Feeling of Stress : Rather much   Received from [employer]   Social Network  Housing Stability: Low Risk  (09/12/2021)   Received from [facility] Stability Vital Sign    Unable to Pay for Housing in the Last Year: No    Number of Places Lived in the Last Year: 2    Unstable Housing in the Last Year: No    Objective:    Vitals:   10/21/23 1133  BP: 120/80  Temp: 36.9 C (98.4 F)  Weight: 96.5 kg (212 lb 12.8 oz)  Height: 162.6 cm (5\' 4" )  PainSc:   7     Exam Gen: NAD Abd: soft Rectal: Induration noted in the left and right perianal regions.  No obvious fistula seen.  Significant tenderness to palpation in the left lateral perianal area.  No obvious fluctuance noted, just induration.   Labs, Imaging and Diagnostic Testing: MRI images and report reviewed  Assessment and Plan:  Diagnoses and all orders for this visit:  Perianal Crohn's disease, with fistula (CMS/HHS-HCC)    Patient with significant pain especially with bowel movements and exam concerning for possible underlying fistula especially on the left side.  I have recommended an urgent exam under anesthesia for evaluation and drainage.  We discussed the possibility of placing a seton.  We discussed that she can continue on her Humira for now as long as she does not develop any signs of a systemic infection.  If this occurs, she should proceed to the emergency department as soon as possible.  [name], MD Colon and Rectal Surgery [facility]

## 2023-10-21 NOTE — H&P (View-Only) (Signed)
 REFERRING PHYSICIAN:  Pyrtle, Burnard Leigh, MD  PROVIDER:  Elenora Gamma, MD  MRN: [id number] DOB: August 12, 1981 DATE OF ENCOUNTER: 10/21/2023  Subjective   Chief Complaint: New Consultation     History of Present Illness: Nancy Reeves is a 42 y.o. female who is seen today as an office consultation at the request of Dr. Rhea Belton for evaluation of New Consultation .  42 year old female with Crohn's disease treated with Humira and Imuran, diagnosed in 2023.  Last colonoscopy performed in July 2024.  This showed improvement in her perianal disease with minimal endoluminal disease.  She has continued to have issues with bloody diarrhea and pain.  MRI was performed in early November.  This showed diffuse wall thickening and mucosal hyperenhancement of the rectum and anus with a left posterior anal fissure or possible fistula track which is similar to prior examination.  She was treated with Cipro and Flagyl and referred here for further evaluation.   Review of Systems: A complete review of systems was obtained from the patient.  I have reviewed this information and discussed as appropriate with the patient.  See HPI as well for other ROS.   Medical History: Past Medical History:  Diagnosis Date   GERD (gastroesophageal reflux disease)     There is no problem list on file for this patient.   Past Surgical History:  Procedure Laterality Date   Hemorrhoid removal       Allergies  Allergen Reactions   Triptans-5-Ht1 Antimigraine Agents Anaphylaxis, Headache, Nausea, Shortness Of Breath and Vomiting    Current Outpatient Medications on File Prior to Visit  Medication Sig Dispense Refill   albuterol MDI, PROVENTIL, VENTOLIN, PROAIR, HFA 90 mcg/actuation inhaler Ventolin HFA 90 mcg/actuation aerosol inhaler INHALE 1 PUFF INTO THE LUNGS EVERY 4 HOURS     azaTHIOprine (IMURAN) 50 mg tablet Take 50 mg by mouth once daily     ergocalciferol, vitamin D2, 1,250 mcg (50,000 unit)  capsule      HUMIRA,CF, PEN 80 mg/0.8 mL PnKt Inject 160mg  (2 pens) subcutaneously on day 1. Then inject 80mg  (1 pen) subcutaneously on day 15.     ibuprofen (MOTRIN) 200 MG tablet Take 200 mg by mouth every 6 (six) hours as needed     metroNIDAZOLE (FLAGYL) 250 MG tablet Take 250 mg by mouth     ondansetron (ZOFRAN) 4 MG tablet Take 4 mg by mouth every 6 (six) hours as needed     No current facility-administered medications on file prior to visit.    History reviewed. No pertinent family history.   Social History   Tobacco Use  Smoking Status Never  Smokeless Tobacco Never     Social History   Socioeconomic History   Marital status: Married  Tobacco Use   Smoking status: Never   Smokeless tobacco: Never  Substance and Sexual Activity   Alcohol use: Yes   Drug use: Never   Social Drivers of Corporate investment banker Strain: Low Risk  (09/12/2021)   Received from [facility]   Overall Financial Resource Strain (CARDIA)    Difficulty of Paying Living Expenses: Not very hard  Food Insecurity: No Food Insecurity (11/26/2021)   Received from [facility]   Hunger Vital Sign    Worried About Running Out of Food in the Last Year: Never true    Ran Out of Food in the Last Year: Never true  Transportation Needs: No Transportation Needs (09/12/2021)   Received from [facility] -  Administrator, Civil Service (Medical): No    Lack of Transportation (Non-Medical): No  Physical Activity: Insufficiently Active (09/12/2021)   Received from [facility]   Exercise Vital Sign    Days of Exercise per Week: 2 days    Minutes of Exercise per Session: 30 min  Stress: Stress Concern Present (09/12/2021)   Received from [facility] of [facility]ational Health - Occupational Stress Questionnaire    Feeling of Stress : Rather much   Received from [employer]   Social Network  Housing Stability: Low Risk  (09/12/2021)   Received from [facility] Stability Vital Sign    Unable to Pay for Housing in the Last Year: No    Number of Places Lived in the Last Year: 2    Unstable Housing in the Last Year: No    Objective:    Vitals:   10/21/23 1133  BP: 120/80  Temp: 36.9 C (98.4 F)  Weight: 96.5 kg (212 lb 12.8 oz)  Height: 162.6 cm (5\' 4" )  PainSc:   7     Exam Gen: NAD Abd: soft Rectal: Induration noted in the left and right perianal regions.  No obvious fistula seen.  Significant tenderness to palpation in the left lateral perianal area.  No obvious fluctuance noted, just induration.   Labs, Imaging and Diagnostic Testing: MRI images and report reviewed  Assessment and Plan:  Diagnoses and all orders for this visit:  Perianal Crohn's disease, with fistula (CMS/HHS-HCC)    Patient with significant pain especially with bowel movements and exam concerning for possible underlying fistula especially on the left side.  I have recommended an urgent exam under anesthesia for evaluation and drainage.  We discussed the possibility of placing a seton.  We discussed that she can continue on her Humira for now as long as she does not develop any signs of a systemic infection.  If this occurs, she should proceed to the emergency department as soon as possible.  [name], MD Colon and Rectal Surgery [facility]

## 2023-10-23 ENCOUNTER — Telehealth: Payer: Self-pay | Admitting: Internal Medicine

## 2023-10-23 ENCOUNTER — Ambulatory Visit: Payer: BC Managed Care – PPO | Admitting: Internal Medicine

## 2023-10-23 ENCOUNTER — Other Ambulatory Visit (INDEPENDENT_AMBULATORY_CARE_PROVIDER_SITE_OTHER): Payer: BC Managed Care – PPO

## 2023-10-23 ENCOUNTER — Other Ambulatory Visit: Payer: Self-pay | Admitting: Internal Medicine

## 2023-10-23 ENCOUNTER — Ambulatory Visit: Payer: Self-pay | Admitting: General Surgery

## 2023-10-23 ENCOUNTER — Encounter: Payer: Self-pay | Admitting: Internal Medicine

## 2023-10-23 ENCOUNTER — Ambulatory Visit (INDEPENDENT_AMBULATORY_CARE_PROVIDER_SITE_OTHER): Payer: BC Managed Care – PPO | Admitting: Internal Medicine

## 2023-10-23 VITALS — BP 112/82 | HR 101 | Ht 64.0 in | Wt 213.2 lb

## 2023-10-23 DIAGNOSIS — R1084 Generalized abdominal pain: Secondary | ICD-10-CM

## 2023-10-23 DIAGNOSIS — K50118 Crohn's disease of large intestine with other complication: Secondary | ICD-10-CM | POA: Diagnosis not present

## 2023-10-23 DIAGNOSIS — K6289 Other specified diseases of anus and rectum: Secondary | ICD-10-CM

## 2023-10-23 DIAGNOSIS — K50119 Crohn's disease of large intestine with unspecified complications: Secondary | ICD-10-CM

## 2023-10-23 DIAGNOSIS — K219 Gastro-esophageal reflux disease without esophagitis: Secondary | ICD-10-CM | POA: Diagnosis not present

## 2023-10-23 DIAGNOSIS — Z79899 Other long term (current) drug therapy: Secondary | ICD-10-CM | POA: Diagnosis not present

## 2023-10-23 LAB — COMPREHENSIVE METABOLIC PANEL
ALT: 16 U/L (ref 0–35)
AST: 22 U/L (ref 0–37)
Albumin: 3.8 g/dL (ref 3.5–5.2)
Alkaline Phosphatase: 47 U/L (ref 39–117)
BUN: 7 mg/dL (ref 6–23)
CO2: 27 meq/L (ref 19–32)
Calcium: 9.3 mg/dL (ref 8.4–10.5)
Chloride: 106 meq/L (ref 96–112)
Creatinine, Ser: 0.84 mg/dL (ref 0.40–1.20)
GFR: 85.37 mL/min (ref >60.00)
Glucose, Bld: 100 mg/dL — ABNORMAL HIGH (ref 70–99)
Potassium: 4.3 meq/L (ref 3.5–5.1)
Sodium: 137 meq/L (ref 135–145)
Total Bilirubin: 0.5 mg/dL (ref 0.2–1.2)
Total Protein: 8.3 g/dL (ref 6.0–8.3)

## 2023-10-23 LAB — CBC WITH DIFFERENTIAL/PLATELET
Basophils Absolute: 0 10*3/uL (ref 0.0–0.1)
Basophils Relative: 0.6 % (ref 0.0–3.0)
Eosinophils Absolute: 0.2 10*3/uL (ref 0.0–0.7)
Eosinophils Relative: 4.2 % (ref 0.0–5.0)
HCT: 35.7 % — ABNORMAL LOW (ref 36.0–46.0)
Hemoglobin: 11.9 g/dL — ABNORMAL LOW (ref 12.0–15.0)
Lymphocytes Relative: 26.1 % (ref 12.0–46.0)
Lymphs Abs: 1.3 10*3/uL (ref 0.7–4.0)
MCHC: 33.3 g/dL (ref 30.0–36.0)
MCV: 77.5 fL — ABNORMAL LOW (ref 78.0–100.0)
Monocytes Absolute: 0.7 10*3/uL (ref 0.1–1.0)
Monocytes Relative: 13.2 % — ABNORMAL HIGH (ref 3.0–12.0)
Neutro Abs: 2.8 10*3/uL (ref 1.4–7.7)
Neutrophils Relative %: 55.9 % (ref 43.0–77.0)
Platelets: 277 10*3/uL (ref 150.0–400.0)
RBC: 4.6 Mil/uL (ref 3.87–5.11)
RDW: 15.9 % — ABNORMAL HIGH (ref 11.5–15.5)
WBC: 5 10*3/uL (ref 4.0–10.5)

## 2023-10-23 MED ORDER — HYOSCYAMINE SULFATE 0.125 MG SL SUBL: SUBLINGUAL_TABLET | SUBLINGUAL | 11 refills | Status: DC

## 2023-10-23 MED ORDER — AZATHIOPRINE 50 MG PO TABS: 100.0000 mg | ORAL_TABLET | Freq: Every day | ORAL | 1 refills | Status: DC

## 2023-10-23 NOTE — Telephone Encounter (Signed)
PT was scheduled for an appointment today at 1010 and is calling to let us know that although she missed the appointment she is on the way now to be seen.

## 2023-10-23 NOTE — Patient Instructions (Addendum)
Your provider has requested that you go to the basement level for lab work before leaving today. Press "B" on the elevator. The lab is located at the first door on the left as you exit the elevator.  We have sent the following medications to your pharmacy for you to pick up at your convenience: Levsin sublingual taking 1-2 tablets under tongue every 6 hours as needed for abdominal cramping/spasms and azathioprine 100 mg daily.   Please come back to our lab in the basement for repeat labs in 2 weeks after increasing the azathioprine.  Please purchase the following medications over the counter and take as directed: Miralax 17 grams daily.   We have referred you to see Behavioral health. They will contact you with an appointment.   _______________________________________________________  If your blood pressure at your visit was 140/90 or greater, please contact your primary care physician to follow up on this.  _______________________________________________________  If you are age 6 or older, your body mass index should be between 23-30. Your Body mass index is 36.6 kg/m. If this is out of the aforementioned range listed, please consider follow up with your Primary Care Provider.  If you are age 74 or younger, your body mass index should be between 19-25. Your Body mass index is 36.6 kg/m. If this is out of the aformentioned range listed, please consider follow up with your Primary Care Provider.   ________________________________________________________  The [facility] providers would like to encourage you to use [facility] to communicate with providers for non-urgent requests or questions.  Due to long hold times on the telephone, sending your provider a message by [facility] may be a faster and more efficient way to get a response.  Please allow 48 business hours for a response.  Please remember that this is for non-urgent requests.   _______________________________________________________

## 2023-10-23 NOTE — Progress Notes (Signed)
Patient ID: Nancy Reeves, female   DOB: [birth year], 42 y.o.   MRN: [id number] HPI: Discussed the use of AI scribe software for clinical note transcription with the patient, who gave verbal consent to proceed.  History of Present Illness   Nancy Reeves, a 42 year old with a history of Crohn's colitis with perianal involvement, presents for follow-up. The patient was previously on Humira 40mg  every 14 days and azathioprine 50mg  daily.   A colonoscopy performed on 06/09/23 revealed nodular and ulcerated mucosa at the distal rectum and anal canal, with the remainder of the colon showing no evidence of active colitis. Biopsies showed a small adenoma in the cecum, minimal reactive reparative change in the sigmoid and descending colon, and mild reactive and reparative change in the rectum. The anal canal showed ulceration and granulation, negative for CMV and HSV, consistent with Crohn's.  The patient's adalimumab drug level was 4.3 with undetectable antibodies on 09/02/23, leading to an increase in Humira to weekly. On 09/30/23, the patient's fecal calprotectin was 1520. The patient has previously tested as a heterozygote or low metabolizer with a TPMT of 8.  The patient reports morning abdominal discomfort and a sensation of incomplete bowel movements, describing it as "not better, not better." The patient also reports a sensation of stool "stopping" at the anal canal, leading to prolonged periods in the bathroom. The patient denies diarrhea but reports occasional blood in the stool. The patient also reports abdominal pain, both during and independent of bowel movements, as well as occasional epigastric pain.  The patient has a history of long-term disability with [facility], which was recently discontinued. The patient is in the process of appealing this decision.     She has continued famotidine 40 mg on a near daily basis for dyspepsia and indigestion  She is having a significant issue with  anxiety due to her Crohn's disease, trouble with her toileting issues due to the severity of her Crohn's disease and abdominal pain.  She is requesting behavioral medicine consult  She has an exam under anesthesia scheduled with Dr. 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 4 each 11   albuterol (VENTOLIN HFA) 108 (90 Base) MCG/ACT inhaler Inhale 1 puff into the lungs every 4 (four) hours. 18 g 0   chlorhexidine (PERIDEX) 0.12 % solution SMARTSIG:By Mouth     ciprofloxacin (CIPRO) 500 MG tablet Take 1 tablet (500 mg total) by mouth 2 (two) times daily. 20 tablet 0   famotidine (PEPCID) 40 MG tablet Take 40 mg by mouth daily.      ibuprofen (ADVIL) 200 MG tablet Take by mouth.     metroNIDAZOLE (FLAGYL) 250 MG tablet Take 1 tablet (250 mg total) by mouth  3 (three) times daily. 30 tablet 0   ondansetron (ZOFRAN) 4 MG tablet Take 1 tablet (4 mg total) by mouth every 6 (six) hours as needed for nausea. 30 tablet 2   polyethylene glycol (MIRALAX / GLYCOLAX) 17 g packet Take 17 g by mouth daily as needed.     predniSONE (DELTASONE) 10 MG tablet Take 3 tablets (30 mg total) by mouth daily with breakfast for 7 days, THEN 2.5 tablets (25 mg total) daily with breakfast for 7 days, THEN 2 tablets (20 mg total)  daily with breakfast for 7 days, THEN 1.5 tablets (15 mg total) daily with breakfast for 7 days, THEN 1 tablet (10 mg total) daily with breakfast for 7 days, THEN 0.5 tablets (5 mg total) daily with breakfast for 7 days. 73.5 tablet 0   azaTHIOprine (IMURAN) 50 MG tablet TAKE 1 TABLET(50 MG) BY MOUTH DAILY 30 tablet 1   Adalimumab (HUMIRA-CD/UC/HS STARTER) 80 MG/0.8ML PNKT Inject 160mg  (2 pens) subcutaneously on day 1. Then inject 80mg  (1 pen) subcutaneously on day 15. (Patient not taking: Reported on 05/27/2023) 3 each 0   tiZANidine (ZANAFLEX) 4 MG capsule Take 4 mg by mouth daily. (Patient not taking: Reported on 05/27/2023)     traMADol (ULTRAM) 50 MG tablet Take by mouth. (Patient not taking: Reported on 05/27/2023)     No facility-administered medications prior to visit.    Allergies  Allergen Reactions   Sumatriptan Anaphylaxis   Triptans Anaphylaxis   Eletriptan Hydrobromide Swelling    Family History  Problem Relation Age of Onset   Thyroid disease Mother    Migraines Mother    Colon cancer Neg Hx    Esophageal cancer Neg Hx    Rectal cancer Neg Hx    Stomach cancer Neg Hx    Colon polyps Neg Hx     Social History   Tobacco Use   Smoking status: Never   Smokeless tobacco: Never  Vaping Use   Vaping status: Never Used  Substance Use Topics   Alcohol use: Yes    Comment: occasional   Drug use: No    ROS: As per history of present illness, otherwise negative  BP 112/82   Pulse (!) 101   Ht 5\' 4"  (1.626 m)   Wt 213 lb 4 oz (96.7 kg)   SpO2 96%   BMI 36.60 kg/m  Gen: awake, alert, NAD, tearful at times over her medical condition HEENT: anicteric  CV: RRR, no mrg Pulm: CTA b/l Abd: soft, diffusely tender without rebound or guarding, ND, +BS throughout Rectal: Deferred today pending EUA on 11/07/2023 Ext: no c/c/e Neuro: nonfocal   RELEVANT LABS AND IMAGING: CBC    Component Value Date/Time   WBC 5.0 10/23/2023 1233   RBC 4.60 10/23/2023 1233   HGB 11.9  (L) 10/23/2023 1233   HCT 35.7 (L) 10/23/2023 1233   PLT 277.0 10/23/2023 1233   MCV 77.5 (L) 10/23/2023 1233   MCH 20.9 (L) 11/01/2021 0155   MCHC 33.3 10/23/2023 1233   RDW 15.9 (H) 10/23/2023 1233   LYMPHSABS 1.3 10/23/2023 1233   MONOABS 0.7 10/23/2023 1233   EOSABS 0.2 10/23/2023 1233   BASOSABS 0.0 10/23/2023 1233    CMP     Component Value Date/Time   NA 137 10/23/2023 1233   K 4.3 10/23/2023 1233   CL 106 10/23/2023 1233   CO2 27 10/23/2023 1233   GLUCOSE 100 (H) 10/23/2023 1233   BUN 7 10/23/2023 1233   CREATININE 0.84 10/23/2023  1233   CALCIUM 9.3 10/23/2023 1233   PROT 8.3 10/23/2023 1233   ALBUMIN 3.8 10/23/2023 1233   AST 22 10/23/2023 1233   ALT 16 10/23/2023 1233   ALKPHOS 47 10/23/2023 1233   BILITOT 0.5 10/23/2023 1233   GFRNONAA >60 11/01/2021 0155   GFRAA >60 08/18/2019 0530    Results   LABS Adalimumab: 4.3 (09/02/2023) Fecal calprotectin: 1520 (09/30/2023) TPMT: 8  DIAGNOSTIC Colonoscopy: Nodular and ulcerated mucosa at distal rectum and anal canal; small adenoma in cecum; minimal reactive reparative change in sigmoid and descending colon; mild reactive and reparative change and architectural disarray in rectum; ulceration and granulation in anal canal; negative for CMV and HSV. (06/09/2023)     MRI PELVIS WITHOUT AND WITH CONTRAST   TECHNIQUE: Multiplanar multisequence MR imaging of the pelvis was performed both before and after administration of intravenous contrast.   CONTRAST:  10mL GADAVIST GADOBUTROL 1 MMOL/ML IV SOLN   COMPARISON:  12/24/2021   FINDINGS: Urinary Tract:  No abnormality visualized.   Bowel: Diffuse wall thickening and mucosal hyperenhancement of the included rectum, with multiple small prominent perirectal lymph nodes (series 10, image 24). At the the posterior left aspect of the anus, approximately 4 o'clock face in lithotomy position, there is an anal fissure or very short fistula tract, appearance similar  to prior examination (series 10, image 27, series 6, image 27). Diffuse skin thickening and hyperenhancement about the anal verge. No associated fluid collection.   Vascular/Lymphatic: No pathologically enlarged lymph nodes. No significant vascular abnormality seen.   Reproductive:  No mass or other significant abnormality   Other:  None.   Musculoskeletal: No suspicious bone lesions identified.   IMPRESSION: 1. Diffuse wall thickening and mucosal hyperenhancement of the included rectum and anus, with multiple small prominent perirectal lymph nodes, consistent with active inflammatory bowel disease. 2. At the posterior left aspect of the anus, approximately 4 o'clock face in lithotomy position, there is an anal fissure or very short fistula tract, appearance similar to prior examination. 3. Diffuse skin thickening and hyperenhancement about the anal verge, consistent with cellulitis. No associated fluid collection.     Electronically Signed   By: Jearld Lesch M.D.   On: 10/10/2023 16:30   ASSESSMENT/PLAN: Assessment and Plan    Crohn's Colitis with Perianal Involvement Persistent symptoms despite Humira, now at weekly, and azathioprine. Recent colonoscopy showed quiescent colitis in the colon but active disease in the anal canal. High fecal calprotectin suggestive of ongoing inflammation.  She also has had an abnormal MRI within the last 2 weeks showing diffuse wall thickening and hyperenhancement in the rectum and anus with prominent lymph nodes consistent with active IBD.  There is a left posterior fissure or short fistula and possibly cellulitis. -Increase azathioprine to 100mg  daily. -Continue Humira weekly. -Order CBC, CMP, and Humira level today. -Check fecal calprotectin. -Proceed with planned exam under anesthesia with possible dilation of anal stricture. -Consider Levsin for cramping. -Encourage daily use of Miralax to soften stool. -Check CBC, CMP in 2-3 weeks  after increasing azathioprine.  Anxiety related to General Medical Condition Patient reports significant anxiety related to her Crohn's disease, particularly around bowel movements and social situations. -Refer to behavioral medicine for further evaluation and management.  General Health Maintenance/GERD and dyspepsia -Continue Pepcid as needed for indigestion.  She is medically disabled at this point from her severe Crohn's colitis and perianal involvement -Write appeal letter for long-term disability claim.       UU:VOZDGUY, Ruby Cola,  Np [address] [place],  [place] [address]  40 minutes total spent today including patient facing time, coordination of care, reviewing medical history/procedures/pertinent radiology studies, and documentation of the encounter.

## 2023-10-23 NOTE — Telephone Encounter (Signed)
Patient is being seen in the office today.

## 2023-10-24 ENCOUNTER — Ambulatory Visit: Payer: BC Managed Care – PPO | Admitting: Physician Assistant

## 2023-10-24 ENCOUNTER — Other Ambulatory Visit (HOSPITAL_COMMUNITY): Payer: Self-pay

## 2023-10-24 ENCOUNTER — Other Ambulatory Visit (HOSPITAL_COMMUNITY): Payer: Self-pay | Admitting: Pharmacy Technician

## 2023-10-24 NOTE — Progress Notes (Signed)
Specialty Pharmacy Refill Coordination Note  Nancy Reeves is a 42 y.o. female contacted today regarding refills of specialty medication(s) Adalimumab  Patient requested Delivery   Delivery date: 10/27/23 ** 10/28/23 since we don't mail fridge on Fridays**  Verified address: [address] [place] [address]   Medication will be filled on 10/27/23.

## 2023-10-27 ENCOUNTER — Other Ambulatory Visit: Payer: Self-pay

## 2023-10-27 ENCOUNTER — Other Ambulatory Visit (HOSPITAL_COMMUNITY): Payer: Self-pay

## 2023-10-27 ENCOUNTER — Other Ambulatory Visit: Payer: BC Managed Care – PPO

## 2023-10-27 DIAGNOSIS — K50119 Crohn's disease of large intestine with unspecified complications: Secondary | ICD-10-CM | POA: Diagnosis not present

## 2023-10-27 NOTE — Progress Notes (Signed)
Patient was contacted regarding their copay of $12,405.97 for Humira. Per patient premium was paid last Friday, patient to contact insurance and will follow up with pharmacy to reprocess. Once billing has been corrected, patient is requesting to switch to pick up from [facility].

## 2023-10-28 ENCOUNTER — Other Ambulatory Visit: Payer: Self-pay

## 2023-10-28 ENCOUNTER — Other Ambulatory Visit (HOSPITAL_COMMUNITY): Payer: Self-pay

## 2023-10-28 NOTE — Progress Notes (Signed)
Patient called on 12/9 to let pharmacy know the premium was paid with the insurance. A test claim was ran, however, the payment was not updated in the system. Patient was notified that it may take 24-48 to update before getting a paid claim. Pharmacy will call patient once approved. Contact number verified with patient. Humira dose was due on Monday, 10/27/23.

## 2023-10-29 ENCOUNTER — Other Ambulatory Visit: Payer: Self-pay

## 2023-10-29 LAB — CALPROTECTIN, FECAL: Calprotectin, Fecal: 854 ug/g — ABNORMAL HIGH (ref 0–120)

## 2023-10-29 NOTE — Progress Notes (Signed)
Called patient to let her know Humira went through for a paid claim on her insurance for around $12,000. Patient states that she has met her out of pocket maximum and insurance should pay 100%. Patient will call insurance. Patient is aware that copay card has depleted funds.   Also note test claims and fills through [facility] do not reverse with [employer] without calling insurance.

## 2023-10-30 ENCOUNTER — Other Ambulatory Visit: Payer: Self-pay

## 2023-10-31 ENCOUNTER — Other Ambulatory Visit: Payer: Self-pay

## 2023-11-03 ENCOUNTER — Encounter: Payer: Self-pay | Admitting: Internal Medicine

## 2023-11-03 ENCOUNTER — Other Ambulatory Visit (HOSPITAL_COMMUNITY): Payer: Self-pay

## 2023-11-05 ENCOUNTER — Encounter (HOSPITAL_BASED_OUTPATIENT_CLINIC_OR_DEPARTMENT_OTHER): Payer: Self-pay | Admitting: General Surgery

## 2023-11-05 LAB — ADALIMUMAB+AB (SERIAL MONITOR): Adalimumab Drug Level: 17 ug/mL

## 2023-11-05 NOTE — Progress Notes (Signed)
Spoke w/ via phone for pre-op interview--- Nancy Reeves needs dos---- UPT per anesthesia        Reeves results------ COVID test -----patient states asymptomatic no test needed Arrive at -------0630 NPO after MN NO Solid Food.   Med rec completed Medications to take morning of surgery -----Pepcid. Levsin and Zofran PRN Diabetic medication ----- Patient instructed no nail polish to be worn day of surgery Patient instructed to bring photo id and insurance card day of surgery Patient aware to have Driver (ride ) / caregiver    for 24 hours after surgery - Husband Malachy Chamber Patient Special Instructions ----- Pre-Op special Instructions ----- Patient verbalized understanding of instructions that were given at this phone interview. Patient denies chest pain, sob, fever, cough at the interview.

## 2023-11-06 NOTE — Anesthesia Preprocedure Evaluation (Addendum)
Anesthesia Evaluation  Patient identified by MRN, date of birth, ID band Patient awake    Reviewed: Allergy & Precautions, NPO status , Patient's Chart, lab work & pertinent test results  Airway Mallampati: II  TM Distance: >3 FB Neck ROM: Full    Dental  (+) Teeth Intact, Dental Advisory Given   Pulmonary neg pulmonary ROS   Pulmonary exam normal breath sounds clear to auscultation       Cardiovascular negative cardio ROS Normal cardiovascular exam Rhythm:Regular Rate:Normal     Neuro/Psych  Headaches PSYCHIATRIC DISORDERS Anxiety     Pseudotumor cerebri    GI/Hepatic Neg liver ROS, PUD,GERD  Controlled,,UC, perianal fistula   Endo/Other  diabetes (prediabetic)  Obesity BMI 36  Renal/GU negative Renal ROS  negative genitourinary   Musculoskeletal  (+)  Fibromyalgia -  Abdominal  (+) + obese  Peds  Hematology negative hematology ROS (+)   Anesthesia Other Findings   Reproductive/Obstetrics negative OB ROS                             Anesthesia Physical Anesthesia Plan  ASA: 3  Anesthesia Plan: MAC   Post-op Pain Management:    Induction:   PONV Risk Score and Plan: 2 and Propofol infusion and TIVA  Airway Management Planned: Natural Airway and Simple Face Mask  Additional Equipment: None  Intra-op Plan:   Post-operative Plan:   Informed Consent: I have reviewed the patients History and Physical, chart, labs and discussed the procedure including the risks, benefits and alternatives for the proposed anesthesia with the patient or authorized representative who has indicated his/her understanding and acceptance.     Dental advisory given  Plan Discussed with: CRNA  Anesthesia Plan Comments:        Anesthesia Quick Evaluation

## 2023-11-07 ENCOUNTER — Other Ambulatory Visit: Payer: Self-pay

## 2023-11-07 ENCOUNTER — Ambulatory Visit (HOSPITAL_BASED_OUTPATIENT_CLINIC_OR_DEPARTMENT_OTHER): Payer: BC Managed Care – PPO | Admitting: Anesthesiology

## 2023-11-07 ENCOUNTER — Ambulatory Visit (HOSPITAL_BASED_OUTPATIENT_CLINIC_OR_DEPARTMENT_OTHER)
Admission: RE | Admit: 2023-11-07 | Discharge: 2023-11-07 | Disposition: A | Discharge status: Home / Self Care | Payer: BC Managed Care – PPO | Attending: General Surgery | Admitting: General Surgery

## 2023-11-07 ENCOUNTER — Encounter (HOSPITAL_BASED_OUTPATIENT_CLINIC_OR_DEPARTMENT_OTHER)
Admission: RE | Disposition: A | Discharge status: Home / Self Care | Payer: Self-pay | Source: Home / Self Care | Attending: General Surgery

## 2023-11-07 ENCOUNTER — Encounter (HOSPITAL_BASED_OUTPATIENT_CLINIC_OR_DEPARTMENT_OTHER): Payer: Self-pay | Admitting: General Surgery

## 2023-11-07 DIAGNOSIS — K6289 Other specified diseases of anus and rectum: Secondary | ICD-10-CM | POA: Diagnosis not present

## 2023-11-07 DIAGNOSIS — Z79624 Long term (current) use of inhibitors of nucleotide synthesis: Secondary | ICD-10-CM | POA: Insufficient documentation

## 2023-11-07 DIAGNOSIS — K50111 Crohn's disease of large intestine with rectal bleeding: Secondary | ICD-10-CM | POA: Insufficient documentation

## 2023-11-07 DIAGNOSIS — K603 Anal fistula, unspecified: Secondary | ICD-10-CM | POA: Diagnosis not present

## 2023-11-07 DIAGNOSIS — K219 Gastro-esophageal reflux disease without esophagitis: Secondary | ICD-10-CM | POA: Insufficient documentation

## 2023-11-07 DIAGNOSIS — Z01818 Encounter for other preprocedural examination: Secondary | ICD-10-CM

## 2023-11-07 DIAGNOSIS — K50113 Crohn's disease of large intestine with fistula: Secondary | ICD-10-CM | POA: Diagnosis not present

## 2023-11-07 DIAGNOSIS — Z79631 Long term (current) use of antimetabolite agent: Secondary | ICD-10-CM | POA: Diagnosis not present

## 2023-11-07 HISTORY — DX: Pneumonia, unspecified organism: J18.9

## 2023-11-07 LAB — POCT PREGNANCY, URINE: Preg Test, Ur: NEGATIVE

## 2023-11-07 SURGERY — EXAM UNDER ANESTHESIA
Anesthesia: Monitor Anesthesia Care | Site: Rectum

## 2023-11-07 MED ORDER — ACETAMINOPHEN 500 MG PO TABS: 1000.0000 mg | ORAL_TABLET | ORAL | Status: DC

## 2023-11-07 MED ORDER — DEXMEDETOMIDINE HCL IN NACL 80 MCG/20ML IV SOLN
INTRAVENOUS | Status: DC | PRN
  Administered 2023-11-07: 4 ug via INTRAVENOUS

## 2023-11-07 MED ORDER — BUPIVACAINE LIPOSOME 1.3 % IJ SUSP: 20.0000 mL | Freq: Once | INTRAMUSCULAR | Status: DC

## 2023-11-07 MED ORDER — FENTANYL CITRATE (PF) 100 MCG/2ML IJ SOLN
INTRAMUSCULAR | Status: AC
  Filled 2023-11-07: qty 2

## 2023-11-07 MED ORDER — ACETAMINOPHEN 500 MG PO TABS
ORAL_TABLET | ORAL | Status: AC
  Filled 2023-11-07: qty 2

## 2023-11-07 MED ORDER — ACETAMINOPHEN 500 MG PO TABS
1000.0000 mg | ORAL_TABLET | Freq: Once | ORAL | Status: AC
Start: 2023-11-07 — End: 2023-11-07
  Administered 2023-11-07: 1000 mg via ORAL

## 2023-11-07 MED ORDER — SODIUM CHLORIDE 0.9% FLUSH: 3.0000 mL | Freq: Two times a day (BID) | INTRAVENOUS | Status: DC

## 2023-11-07 MED ORDER — SODIUM CHLORIDE 0.9 % IV SOLN: INTRAVENOUS | Status: DC

## 2023-11-07 MED ORDER — LIDOCAINE HCL (CARDIAC) PF 100 MG/5ML IV SOSY
PREFILLED_SYRINGE | INTRAVENOUS | Status: DC | PRN
  Administered 2023-11-07: 50 mg via INTRAVENOUS

## 2023-11-07 MED ORDER — FENTANYL CITRATE (PF) 100 MCG/2ML IJ SOLN
INTRAMUSCULAR | Status: DC | PRN
  Administered 2023-11-07: 25 ug via INTRAVENOUS

## 2023-11-07 MED ORDER — GABAPENTIN 300 MG PO CAPS
300.0000 mg | ORAL_CAPSULE | ORAL | Status: AC
  Administered 2023-11-07: 300 mg via ORAL

## 2023-11-07 MED ORDER — CELECOXIB 200 MG PO CAPS
200.0000 mg | ORAL_CAPSULE | ORAL | Status: AC
Start: 2023-11-07 — End: 2023-11-07
  Administered 2023-11-07: 200 mg via ORAL

## 2023-11-07 MED ORDER — PROPOFOL 500 MG/50ML IV EMUL
INTRAVENOUS | Status: DC | PRN
  Administered 2023-11-07: 150 ug/kg/min via INTRAVENOUS

## 2023-11-07 MED ORDER — BUPIVACAINE-EPINEPHRINE 0.5% -1:200000 IJ SOLN
INTRAMUSCULAR | Status: DC | PRN
  Administered 2023-11-07: 20 mL

## 2023-11-07 MED ORDER — GABAPENTIN 300 MG PO CAPS
ORAL_CAPSULE | ORAL | Status: AC
  Filled 2023-11-07: qty 1

## 2023-11-07 MED ORDER — ONDANSETRON HCL 4 MG/2ML IJ SOLN
INTRAMUSCULAR | Status: DC | PRN
  Administered 2023-11-07: 4 mg via INTRAVENOUS

## 2023-11-07 MED ORDER — TRAMADOL HCL 50 MG PO TABS: 50.0000 mg | ORAL_TABLET | Freq: Four times a day (QID) | ORAL | 0 refills | Status: AC | PRN

## 2023-11-07 MED ORDER — CELECOXIB 200 MG PO CAPS
ORAL_CAPSULE | ORAL | Status: AC
  Filled 2023-11-07: qty 1

## 2023-11-07 MED ORDER — MIDAZOLAM HCL 2 MG/2ML IJ SOLN
INTRAMUSCULAR | Status: DC | PRN
  Administered 2023-11-07: 2 mg via INTRAVENOUS

## 2023-11-07 MED ORDER — PREDNISONE 10 MG PO TABS: ORAL_TABLET | ORAL | 0 refills | Status: AC

## 2023-11-07 MED ORDER — MIDAZOLAM HCL 2 MG/2ML IJ SOLN
INTRAMUSCULAR | Status: AC
  Filled 2023-11-07: qty 2

## 2023-11-07 SURGICAL SUPPLY — 51 items
BENZOIN TINCTURE PRP APPL 2/3 (GAUZE/BANDAGES/DRESSINGS) ×3 IMPLANT
BLADE EXTENDED COATED 6.5IN (ELECTRODE) IMPLANT
BLADE SURG 10 STRL SS (BLADE) IMPLANT
BRIEF MESH DISP LRG (UNDERPADS AND DIAPERS) ×3 IMPLANT
COVER BACK TABLE 60X90IN (DRAPES) ×3 IMPLANT
COVER MAYO STAND STRL (DRAPES) ×3 IMPLANT
DRAPE HYSTEROSCOPY (MISCELLANEOUS) IMPLANT
DRAPE LAPAROTOMY 100X72 PEDS (DRAPES) ×3 IMPLANT
DRAPE SHEET LG 3/4 BI-LAMINATE (DRAPES) IMPLANT
DRAPE UTILITY XL STRL (DRAPES) ×3 IMPLANT
ELECT REM PT RETURN 9FT ADLT (ELECTROSURGICAL) ×2
ELECTRODE REM PT RTRN 9FT ADLT (ELECTROSURGICAL) ×2 IMPLANT
GAUZE 4X4 16PLY ~~LOC~~+RFID DBL (SPONGE) ×3 IMPLANT
GAUZE PAD ABD 8X10 STRL (GAUZE/BANDAGES/DRESSINGS) ×3 IMPLANT
GAUZE SPONGE 4X4 12PLY STRL (GAUZE/BANDAGES/DRESSINGS) IMPLANT
GLOVE BIO SURGEON STRL SZ 6.5 (GLOVE) ×3 IMPLANT
GLOVE BIOGEL PI IND STRL 7.0 (GLOVE) ×3 IMPLANT
GLOVE INDICATOR 6.5 STRL GRN (GLOVE) ×3 IMPLANT
GOWN STRL REUS W/TWL XL LVL3 (GOWN DISPOSABLE) ×3 IMPLANT
HYDROGEN PEROXIDE 16OZ (MISCELLANEOUS) IMPLANT
IV CATH 14GX2 1/4 (CATHETERS) IMPLANT
IV CATH 18G SAFETY (IV SOLUTION) IMPLANT
KIT SIGMOIDOSCOPE (SET/KITS/TRAYS/PACK) IMPLANT
KIT TURNOVER CYSTO (KITS) ×3 IMPLANT
LEGGING LITHOTOMY PAIR STRL (DRAPES) IMPLANT
LOOP VASCLR MAXI BLUE 18IN ST (MISCELLANEOUS) ×2 IMPLANT
NDL HYPO 22X1.5 SAFETY MO (MISCELLANEOUS) ×2 IMPLANT
NEEDLE HYPO 22X1.5 SAFETY MO (MISCELLANEOUS) ×2
NS IRRIG 500ML POUR BTL (IV SOLUTION) ×3 IMPLANT
PACK BASIN DAY SURGERY FS (CUSTOM PROCEDURE TRAY) ×3 IMPLANT
PAD ARMBOARD 7.5X6 YLW CONV (MISCELLANEOUS) ×3 IMPLANT
PAD PREP 24X48 CUFFED NSTRL (MISCELLANEOUS) IMPLANT
PENCIL SMOKE EVACUATOR (MISCELLANEOUS) ×3 IMPLANT
SLEEVE SCD COMPRESS KNEE MED (STOCKING) ×3 IMPLANT
SPONGE HEMORRHOID 8X3CM (HEMOSTASIS) IMPLANT
SPONGE SURGIFOAM ABS GEL 100 (HEMOSTASIS) IMPLANT
SPONGE SURGIFOAM ABS GEL 12-7 (HEMOSTASIS) IMPLANT
SUT CHROMIC 2 0 SH (SUTURE) IMPLANT
SUT CHROMIC 3 0 SH 27 (SUTURE) IMPLANT
SUT ETHIBOND 0 (SUTURE) ×3 IMPLANT
SUT VIC AB 2-0 SH 27XBRD (SUTURE) IMPLANT
SUT VIC AB 3-0 SH 27XBRD (SUTURE) IMPLANT
SYR 10ML LL (SYRINGE) IMPLANT
SYR CONTROL 10ML LL (SYRINGE) ×3 IMPLANT
TIE VASCULAR MAXI BLUE 18IN ST (MISCELLANEOUS) ×2
TOWEL OR 17X24 6PK STRL BLUE (TOWEL DISPOSABLE) ×3 IMPLANT
TRAY DSU PREP LF (CUSTOM PROCEDURE TRAY) ×3 IMPLANT
TUBE CONNECTING 12X1/4 (SUCTIONS) ×3 IMPLANT
UNDERPAD 30X36 HEAVY ABSORB (UNDERPADS AND DIAPERS) ×3 IMPLANT
VASCULAR TIE MAXI BLUE 18IN ST (MISCELLANEOUS) ×2
YANKAUER SUCT BULB TIP NO VENT (SUCTIONS) ×3 IMPLANT

## 2023-11-07 NOTE — Anesthesia Postprocedure Evaluation (Signed)
Anesthesia Post Note  Patient: Nancy Reeves  Procedure(s) Performed: EXAM UNDER ANESTHESIA (Rectum)     Patient location during evaluation: PACU Anesthesia Type: MAC Level of consciousness: awake and alert Pain management: pain level controlled Vital Signs Assessment: post-procedure vital signs reviewed and stable Respiratory status: spontaneous breathing, nonlabored ventilation and respiratory function stable Cardiovascular status: blood pressure returned to baseline and stable Postop Assessment: no apparent nausea or vomiting Anesthetic complications: no   No notable events documented.  Last Vitals:  Vitals:   11/07/23 1001 11/07/23 1015  BP: (!) 96/54   Pulse: 93 98  Resp: 17 12  Temp: 36.6 C   SpO2: 95% 96%    Last Pain:  Vitals:   11/07/23 0756  TempSrc: Oral                 Lannie Fields

## 2023-11-07 NOTE — Discharge Instructions (Addendum)
Beginning the day after surgery:  You may sit in a tub of warm water 2-3 times a day to relieve discomfort.  Eat a regular diet high in fiber.  Avoid foods that give you constipation or diarrhea.  Avoid foods that are difficult to digest, such as seeds, nuts, corn or popcorn.  Do not go any longer than 2 days without a bowel movement.  You may take a dose of Milk of Magnesia if you become constipated.    Drink 6-8 glasses of water daily.  Walking is encouraged.  Avoid strenuous activity and heavy lifting for one month after surgery.    Call the office if you have any questions or concerns.  Call immediately if you develop:  Excessive rectal bleeding (more than a cup or passing large clots) Increased discomfort Fever greater than 100 F Difficulty urinating  Post Anesthesia Home Care Instructions  Activity: Get plenty of rest for the remainder of the day. A responsible adult should stay with you for 24 hours following the procedure.  For the next 24 hours, DO NOT: -Drive a car -Operate machinery -Drink alcoholic beverages -Take any medication unless instructed by your physician -Make any legal decisions or sign important papers.  Meals: Start with liquid foods such as gelatin or soup. Progress to regular foods as tolerated. Avoid greasy, spicy, heavy foods. If nausea and/or vomiting occur, drink only clear liquids until the nausea and/or vomiting subsides. Call your physician if vomiting continues.  Special Instructions/Symptoms: Your throat may feel dry or sore from the anesthesia or the breathing tube placed in your throat during surgery. If this causes discomfort, gargle with warm salt water. The discomfort should disappear within 24 hours.      

## 2023-11-07 NOTE — Interval H&P Note (Signed)
History and Physical Interval Note:  11/07/2023 7:22 AM  Nancy Reeves  has presented today for surgery, with the diagnosis of PERIANAL FISTULA, CROHN'S.  The various methods of treatment have been discussed with the patient and family. After consideration of risks, benefits and other options for treatment, the patient has consented to  Procedure(s): EXAM UNDER ANESTHESIA (N/A) POSSIBLE INCISION AND DRAINAGE, (N/A) POSSIBLE PLACEMENT OF SETON (N/A) as a surgical intervention.  The patient's history has been reviewed, patient examined, no change in status, stable for surgery.  I have reviewed the patient's chart and labs.  Questions were answered to the patient's satisfaction.     [name], MD  Colorectal and General Surgery [facility] Surgery

## 2023-11-07 NOTE — Op Note (Signed)
11/07/2023  9:55 AM  PATIENT:  Nancy Reeves  42 y.o. female  Patient Care Team: Rebecka Apley, NP as PCP - General (Adult Health Nurse Practitioner)  PRE-OPERATIVE DIAGNOSIS:  PERIANAL FISTULA, CROHN'S  POST-OPERATIVE DIAGNOSIS:  PERIANAL INFLAMMATION due to [facility] DISEASE  PROCEDURE:  EXAM UNDER ANESTHESIA   Surgeon(s): Romie Levee, MD  ASSISTANT: none   ANESTHESIA:   local and MAC  SPECIMEN:  No Specimen  DISPOSITION OF SPECIMEN:  N/A  COUNTS:  YES  PLAN OF CARE: Discharge to home after PACU  PATIENT DISPOSITION:  PACU - hemodynamically stable.  INDICATION: 42 y.o. F with Crohn's disease and recent increase in perianal pain and inflammation.  I suspected abscess or fistula and recommended EUA   OR FINDINGS: perianal induration, distal rectal inflammation, anterior and posterior anal fissures with no sphincter hypertension or anal stricture.  DESCRIPTION: the patient was identified in the preoperative holding area and taken to the OR where they were laid on the operating room table.  MAC anesthesia was induced without difficulty. The patient was then positioned in lithotomy position with buttocks gently taped apart.  The patient was then prepped and draped in usual sterile fashion.  SCDs were noted to be in place prior to the initiation of anesthesia. A surgical timeout was performed indicating the correct patient, procedure, positioning and need for preoperative antibiotics.  A rectal block was performed using Marcaine with epinephrine.    I began with a digital rectal exam.  There was no stricture noted. There were anterior and posterior fissures noted.  I then placed a Hill-Ferguson anoscope into the anal canal and evaluated this completely.  There was distal rectal inflammation noted.  Perianal area was evaluated for abscess.  No fluctuance noted but patient did have bilateral induration.  Needle inserted to aspirate these areas found no pockets of abscess.   No other surgically correctable pathology could be identified.  Hemostasis was achieved using direct pressure.  A dressing was applied.  The patient was awakened from anesthesia and sent to the postanesthesia care unit in stable condition.  All counts were correct per operating room staff.  [name], MD  Colorectal and General Surgery [facility] Surgery

## 2023-11-07 NOTE — Discharge Instr - Supplementary Instructions (Signed)
May take Tylenol after 2pm if needed for discomfort.

## 2023-11-07 NOTE — Transfer of Care (Signed)
Immediate Anesthesia Transfer of Care Note  Patient: Nancy Reeves  Procedure(s) Performed: EXAM UNDER ANESTHESIA (Rectum)  Patient Location: PACU  Anesthesia Type:MAC  Level of Consciousness: awake, alert , oriented, and patient cooperative  Airway & Oxygen Therapy: Patient Spontanous Breathing  Post-op Assessment: Report given to RN and Post -op Vital signs reviewed and stable  Post vital signs: Reviewed and stable  Last Vitals:  Vitals Value Taken Time  BP    Temp    Pulse 96 11/07/23 0959  Resp 20 11/07/23 0959  SpO2 96 % 11/07/23 0959  Vitals shown include unfiled device data.  Last Pain:  Vitals:   11/07/23 0756  TempSrc: Oral      Patients Stated Pain Goal: 5 (11/07/23 0756)  Complications: No notable events documented.

## 2023-11-13 ENCOUNTER — Other Ambulatory Visit (HOSPITAL_COMMUNITY): Payer: Self-pay

## 2023-11-20 ENCOUNTER — Other Ambulatory Visit: Payer: Self-pay

## 2023-11-20 ENCOUNTER — Other Ambulatory Visit (HOSPITAL_COMMUNITY): Payer: Self-pay

## 2023-11-20 NOTE — Progress Notes (Signed)
 Specialty Pharmacy Refill Coordination Note  Nancy Reeves is a 43 y.o. female contacted today regarding refills of specialty medication(s) Adalimumab  (Humira  (2 Pen))   Patient requested Pickup at [facility] at [place] date: 11/21/23   Medication will be filled on 01.02.25.   Patient called requesting refill but is concerned about oop max/ded. for the year and amount copay card will cover. advised pt to verify information with both insurance and copay card before we process claim/ dispense medication as we cannot guarantee or verify future copay funding, if copay card will meet pts oop. max/ded., or what future copay cost will be.

## 2023-12-01 ENCOUNTER — Other Ambulatory Visit (HOSPITAL_COMMUNITY): Payer: Self-pay

## 2023-12-16 ENCOUNTER — Other Ambulatory Visit (HOSPITAL_COMMUNITY): Payer: Self-pay

## 2023-12-18 ENCOUNTER — Other Ambulatory Visit (HOSPITAL_COMMUNITY): Payer: Self-pay

## 2023-12-18 ENCOUNTER — Other Ambulatory Visit: Payer: Self-pay

## 2023-12-18 NOTE — Progress Notes (Signed)
Specialty Pharmacy Refill Coordination Note  Nancy Reeves is a 43 y.o. female contacted today regarding refills of specialty medication(s) Adalimumab (Humira (2 Pen))   Patient requested Pickup at [facility] date: 12/19/23   Medication will be filled on 12/18/23.

## 2023-12-18 NOTE — Progress Notes (Signed)
Specialty Pharmacy Ongoing Clinical Assessment Note  Nancy Reeves is a 43 y.o. female who is being followed by the specialty pharmacy service for RxSp Crohn's Disease   Patient's specialty medication(s) reviewed today: Adalimumab (Humira (2 Pen))   Missed doses in the last 4 weeks: 0   Patient/Caregiver did not have any additional questions or concerns.   Therapeutic benefit summary: Patient is achieving benefit   Adverse events/side effects summary: No adverse events/side effects   Patient's therapy is appropriate to: Continue    Goals Addressed             This Visit's Progress    Minimize recurrence of flares       Patient is on track. Patient will maintain adherence.  Patient reports being well-controlled at this time.          Follow up:  6 months  Servando Snare Specialty Pharmacist

## 2024-01-08 ENCOUNTER — Other Ambulatory Visit (HOSPITAL_COMMUNITY): Payer: Self-pay

## 2024-01-08 ENCOUNTER — Other Ambulatory Visit: Payer: Self-pay

## 2024-01-08 MED ORDER — HUMIRA (2 PEN) 40 MG/0.4ML ~~LOC~~ AJKT
40.0000 mg | AUTO-INJECTOR | SUBCUTANEOUS | 11 refills | Status: AC
  Filled 2024-01-08 – 2024-02-05 (×3): qty 4, 28d supply, fill #0
  Filled 2024-02-27: qty 4, 28d supply, fill #1
  Filled 2024-03-26: qty 4, 28d supply, fill #2
  Filled 2024-04-23: qty 4, 28d supply, fill #3
  Filled 2024-05-20: qty 4, 28d supply, fill #4
  Filled 2024-06-16 – 2024-06-17 (×2): qty 4, 28d supply, fill #5
  Filled 2024-07-12 – 2024-07-15 (×2): qty 4, 28d supply, fill #6
  Filled 2024-08-12: qty 4, 28d supply, fill #7
  Filled 2024-09-08: qty 4, 28d supply, fill #8

## 2024-01-13 ENCOUNTER — Other Ambulatory Visit: Payer: Self-pay

## 2024-01-14 ENCOUNTER — Ambulatory Visit (INDEPENDENT_AMBULATORY_CARE_PROVIDER_SITE_OTHER): Payer: BC Managed Care – PPO | Admitting: Clinical

## 2024-01-14 ENCOUNTER — Encounter (HOSPITAL_COMMUNITY): Payer: Self-pay | Admitting: Clinical

## 2024-01-14 DIAGNOSIS — F064 Anxiety disorder due to known physiological condition: Secondary | ICD-10-CM | POA: Diagnosis not present

## 2024-01-14 DIAGNOSIS — F32A Depression, unspecified: Secondary | ICD-10-CM
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
## 2024-01-15 ENCOUNTER — Encounter (HOSPITAL_COMMUNITY): Payer: Self-pay

## 2024-01-15 ENCOUNTER — Other Ambulatory Visit: Payer: Self-pay

## 2024-01-15 NOTE — Progress Notes (Signed)
 Specialty Pharmacy Refill Coordination Note  Nancy Reeves is a 43 y.o. female contacted today regarding refills of specialty medication(s) Adalimumab (Humira (2 Pen))   Patient requested Delivery   Delivery date: 01/21/24   Verified address: [address], [place], [address]   Medication will be filled on 01/20/24.

## 2024-01-19 ENCOUNTER — Ambulatory Visit (HOSPITAL_COMMUNITY): Payer: BC Managed Care – PPO | Admitting: Clinical

## 2024-01-20 ENCOUNTER — Other Ambulatory Visit: Payer: Self-pay

## 2024-01-20 NOTE — Progress Notes (Signed)
 Patient was contacted today regarding her primary insurance for her specialty medication. Patient is under a grace period and must pay for full cost of medication. Patient will contact insurance and will update pharmacy when ok to reprocess

## 2024-01-22 ENCOUNTER — Other Ambulatory Visit: Payer: Self-pay

## 2024-01-23 ENCOUNTER — Other Ambulatory Visit: Payer: Self-pay

## 2024-01-26 ENCOUNTER — Other Ambulatory Visit: Payer: Self-pay

## 2024-01-27 ENCOUNTER — Other Ambulatory Visit: Payer: Self-pay

## 2024-01-27 NOTE — Progress Notes (Signed)
 Placing Rx on hold; Patient has been called 3/4, 3/6 LVM & 3/11 LVM as well. Trying to follow up with patient about Rx. Note below [place] period claim. Patient required to pay for the full cost of the prescription. Sending [place] a message as well

## 2024-01-28 ENCOUNTER — Encounter (HOSPITAL_COMMUNITY): Payer: Self-pay | Admitting: Clinical

## 2024-01-28 ENCOUNTER — Ambulatory Visit (HOSPITAL_COMMUNITY): Payer: BC Managed Care – PPO | Admitting: Clinical

## 2024-01-28 DIAGNOSIS — F064 Anxiety disorder due to known physiological condition: Secondary | ICD-10-CM | POA: Diagnosis not present

## 2024-01-28 DIAGNOSIS — F32A Depression, unspecified: Secondary | ICD-10-CM | POA: Diagnosis not present

## 2024-01-28 NOTE — Progress Notes (Unsigned)
 Therapy Group Progress Note   01/28/2024   Group Time:  5:00pm-6:00pm  Participation Level:  Active   Behavioral Response: Appropriate, Sharing, and Care-Taking   Type of Therapy: Group Therapy   Check-In:  Nancy Reeves shared that she has always felt guilty because of her medical conditions.  She will start to do well in meeting her life goals, will have financial success, then will have medical crises that cause her not to work, so she has numerous times ended up being evicted.  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  Today she spent 4 hours in the bathroom, for instance, due to her gastrointestinal issues.  Current Concerns Shared:  Current issues identified by Nancy Reeves are speaking negatively to herself, calling herself a "fraud," and beating herself up.  She has had nightmares of rifling through her grandmothers' cabinets looking for drugs, even though (1) they are both deceased and (2) she does not want or take drugs for her pain.  Intervention(s):  Much support was rendered by the other group members.  The majority of the session was spent in mutual support, in fact.  CSW provided psychoeducation on the CBT model and how to challenge thoughts in order to result in better feelings.  This was well-received and all group members appeared to comprehend and appreciate this way of looking at their negative thoughts in a more beneficial manner.   Summary of Progress:  Nancy Reeves verbalized good understanding of concepts about CBT and cognitive distortions as presented.  Patients supported each other, made suggestions to each other, and were appropriate through their interactions.     Recommendations:  It is recommended that Nancy Reeves continue considering all brought up by CSW and fellow patients  throughout group and return to group at next scheduled time or to individual therapy at next scheduled session.  Progress Towards Goals: Progressing   Encounter Diagnoses  Name Primary?   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  Location: Patient: At home in car Provider: [facility] - [facility]   I discussed the limitations of evaluation and management by telemedicine and the availability of in person appointments. The patient expressed understanding and agreed to proceed.   I discussed the assessment and treatment plan with the patient. The patient was provided an opportunity to ask questions and all were answered. The patient agreed with the plan and demonstrated an understanding of the instructions.   The patient was advised to call back or seek an in-person evaluation if the symptoms worsen or if the condition fails to improve as anticipated.  I provided 60 minutes of non-face-to-face time during this encounter.  Ambrose Mantle, LCSW 01/28/2024, 6:10 PM

## 2024-02-05 ENCOUNTER — Other Ambulatory Visit: Payer: Self-pay

## 2024-02-05 ENCOUNTER — Other Ambulatory Visit (HOSPITAL_COMMUNITY): Payer: Self-pay

## 2024-02-05 NOTE — Progress Notes (Signed)
 Specialty Pharmacy Refill Coordination Note  Nancy Reeves is a 43 y.o. female contacted today regarding refills of specialty medication(s) Adalimumab (Humira (2 Pen))   Patient requested Pickup at [facility] date: 02/05/24   Medication will be filled on 02/05/24.

## 2024-02-10 ENCOUNTER — Other Ambulatory Visit: Payer: Self-pay

## 2024-02-16 ENCOUNTER — Ambulatory Visit (INDEPENDENT_AMBULATORY_CARE_PROVIDER_SITE_OTHER): Payer: BC Managed Care – PPO | Admitting: Clinical

## 2024-02-16 ENCOUNTER — Encounter (HOSPITAL_COMMUNITY): Payer: Self-pay | Admitting: Clinical

## 2024-02-16 DIAGNOSIS — F064 Anxiety disorder due to known physiological condition: Secondary | ICD-10-CM | POA: Diagnosis not present

## 2024-02-16 DIAGNOSIS — K5010 Crohn's disease of large intestine without complications: Secondary | ICD-10-CM

## 2024-02-16 DIAGNOSIS — M797 Fibromyalgia: Secondary | ICD-10-CM

## 2024-02-16 DIAGNOSIS — F32A Depression, unspecified: Secondary | ICD-10-CM | POA: Diagnosis not present

## 2024-02-16 NOTE — Progress Notes (Signed)
 THERAPIST PROGRESS NOTE  Session Time: 4:05-5:02pm  Session #2  Participation Level: Active  Behavioral Response: Casual and Well Groomed Alert Euthymic  Type of Therapy: Individual Therapy  Treatment Goals addressed:  STG: Score less than 9 on the PHQ-9 and less than 5 on the GAD-7 as evidenced by intermittent administration of the questionnaires to determine progress in managing depression and anxiety.    STG: Process life events to the extent needed so that will be able to move forward with various areas of life in a better frame of mind per self-report.    STG: Learn and practice communication techniques such as active listening, "I" statements, open-ended questions, reflective listening, assertiveness, fair fighting rules, initiating conversations, and more as necessary and taught in session.    LTG: Identify and decrease cognitive distortions contributing negatively to mood and behavior by identifying 5-7 cognitive distortions that are present; learn how to come up with replacement thoughts that are more balanced, realistic, and helpful.    STG: Explore personal core beliefs, rules and assumptions, and cognitive distortions through therapist using Cognitive Behavioral Therapy; learn about Behavioral Activation and Acting As If.    STG: Learn breathing techniques and grounding techniques at an age-appropriate and ability-appropriate level and demonstrate mastery in session then report independent use of these skills out of session.    STG: Learn about boundary types, how to implement them, and how to enforce them so that feels more empowered and content with being able to maintain more helpful, appropriate boundaries in the future for a more balanced result.    STG: Work to Arts development officer from models like CBT, Stages of Change, DBT, shame resilience theory, ACT, SFBT, MI, trauma-informed therapy and others to be able to manage mental health symptoms, AEB practicing out of session  and reporting back. LTG: Learn appropriate and relevant therapeutic techniques to manage physical pain and regularly use scaling methods to determine effectiveness of the strategies used.   STG: Discuss issues with insomnia and learn more about sleep hygiene; learn and practice tools until sleep is improved per self-report.     ProgressTowards Goals: Progressing  Interventions: CBT and Supportive  Summary: Nancy Reeves is a 43 y.o. female who presents with Crohn's disease and fibromyalgia who is having a lot of anxiety for the last 2 months and presents for therapy. She presented oriented x5 and stated she was feeling "good."  CSW evaluated patient's medication compliance, use of coping tools, and self-care, as applicable.  She provided an update on various aspects of her life that are normally discussed in therapy, including her embarrassment about her unclean home, the way in which she is having difficulty  focusing or "making room" in her head, and more.  She reported that in 2025 she made the decision to not hold things in or bottle them because that only hurts her, not other people.  She processed how that has worked so far for her and how she is glad she is doing this.  She talked about how she loses focus as soon as she sits down to work, thought it might be ADHD, but responded favorably when CSW suggested it sounded like anxiety.  CSW explained to her about Scheduling Worry time and gave her handouts about that technique as well as a list on household chores.  We talked about how she could set an alarm not only for the worry meeting but also for chores.  CSW facilitated her exploration of feeling like a failure  and being ashamed about her house, not letting anybody in, and how this has impacted her marriage. She would like to attend more group sessions between now and her next appointment.  Suicidal/Homicidal: No without intent/plan  Therapist Response: Patient is progressing AEB engaging in  scheduled therapy session.  Throughout the session, CSW gave patient the opportunity to explore thoughts and feelings associated with current life situations and past/present stressors.   CSW challenged patient gently and appropriately to consider different ways of looking at reported issues. CSW encouraged patient's expression of feelings and validated these using empathy, active listening, open body language, and unconditional positive regard.   CSW encouraged patient to schedule more therapy sessions for the future, as needed, along with group sessions.  Plan: Return again in 6 weeks on 5/19  Diagnosis:  Anxiety disorder due to known physiological condition  Depression, unspecified depression type  Collaboration of Care: Other - none needed currently  Patient/Guardian was advised Release of Information must be obtained prior to any record release in order to collaborate their care with an outside provider. Patient/Guardian was advised if they have not already done so to contact the registration department to sign all necessary forms in order for [place] to release information regarding their care.   Consent: Patient/Guardian gives verbal consent for treatment and assignment of benefits for services provided during this visit. Patient/Guardian expressed understanding and agreed to proceed.   Lynnell Chad, LCSW 02/16/2024

## 2024-02-25 ENCOUNTER — Ambulatory Visit (INDEPENDENT_AMBULATORY_CARE_PROVIDER_SITE_OTHER): Admitting: Clinical

## 2024-02-25 DIAGNOSIS — Z91199 Patient's noncompliance with other medical treatment and regimen due to unspecified reason: Secondary | ICD-10-CM

## 2024-02-27 ENCOUNTER — Other Ambulatory Visit: Payer: Self-pay

## 2024-02-27 NOTE — Progress Notes (Signed)
 Specialty Pharmacy Refill Coordination Note  Nancy Reeves is a 43 y.o. female contacted today regarding refills of specialty medication(s) Adalimumab (Humira (2 Pen))   Patient requested Delivery   Delivery date: 03/02/24   Verified address: [address], [place], [address]   Medication will be filled on 04.14.25.

## 2024-02-27 NOTE — Progress Notes (Signed)
 Nancy Reeves was scheduled to attend group therapy on 02/27/24 from 5:00-6:00pm. An email reminder was sent, as well as the link to the meeting on [employer]  She did not show up for the group during the hour-long session.  Nancy Reeves did not show up for the group despite these reminders.   Encounter Diagnosis  Name Primary?   No-show for appointment Yes     Ambrose Mantle, LCSW 02/27/2024, 1:58 PM

## 2024-03-10 ENCOUNTER — Ambulatory Visit (HOSPITAL_COMMUNITY): Admitting: Clinical

## 2024-03-10 ENCOUNTER — Encounter (HOSPITAL_COMMUNITY): Payer: Self-pay | Admitting: Clinical

## 2024-03-10 DIAGNOSIS — F064 Anxiety disorder due to known physiological condition: Secondary | ICD-10-CM

## 2024-03-10 DIAGNOSIS — F32A Depression, unspecified: Secondary | ICD-10-CM | POA: Diagnosis not present

## 2024-03-10 NOTE — Progress Notes (Unsigned)
 Therapy Group Progress Note   03/10/2024   Group Time:  5:00pm-6:00pm  Participation Level:  Active   Behavioral Response: Appropriate and Sharing   Type of Therapy: Group Therapy   Check-In:  Nancy Reeves shared that she is doing pretty well because she decided to set boundaries by not doing for a friend what the friend could just as easily do herself.  She stated she has been snapping at people more recently as she tries not to "appease" them.  Finding a balance would be helpful.  Current Concerns Shared:  Current issues identified by Nancy Reeves are emotional regulation.  Intervention(s):  CSW provided patient with "The Serenity Prayer" often used in 12-step programs and described how it addresses the issue of what we can control and what we cannot control.   To make this useful and practical in the moment, CSW elicited from patient(s) a list of items they cannot control in their current situation, as well as a list of the things they actually can control.  These were placed on a 2-column list.  We explored the idea of finding some comfort in letting go of things that cannot be changed rather than continuing to struggle because of our own resistance.  Finally, at the bottom of the columns, CSW wrote the word "Serenity" under "Cannot Change" and wrote the word "Courage" under "Can Change."  We then discussed the positive impact this could have on patients' mood and outlook.    Summary of Progress:  Nancy Reeves verbalized full understanding of concepts about how to approach the things in her life that can be changed and cannot be changed.   She shared that she cannot change other people's behaviors or emotions and she cannot change her father's capability, needs, emotions, or attacks on her.  She can change her boundaries by setting and enforcing some, can refrain from appeasing people the way she normally has, and can allow others to make their own choices instead of feeling responsible or  taking responsibility for them.  Recommendations:  It is recommended that Nancy Reeves continue considering all brought up by CSW and fellow patients throughout group and return to group at next scheduled time or to individual therapy at next scheduled session.  Progress Towards Goals: Progressing   Encounter Diagnoses  Name Primary?   Anxiety disorder due to known physiological condition Yes   Depression, unspecified depression type      Virtual Visit via Video Note  I connected with Nancy Reeves on 03/10/24 at 5:00pm by [employer] and verified that I am speaking with the correct person using two identifiers.  Location: Patient: Home Provider: [facility] - [facility]   I discussed the limitations of evaluation and management by telemedicine and the availability of in person appointments. The patient expressed understanding and agreed to proceed.   I discussed the assessment and treatment plan with the patient. The patient was provided an opportunity to ask questions and all were answered. The patient agreed with the plan and demonstrated an understanding of the instructions.   The patient was advised to call back or seek an in-person evaluation if the symptoms worsen or if the condition fails to improve as anticipated.  I provided 60 minutes of non-face-to-face time during this encounter.  Nancy Rang, LCSW 03/10/2024, 5:46 PM

## 2024-03-24 ENCOUNTER — Ambulatory Visit (HOSPITAL_COMMUNITY): Admitting: Clinical

## 2024-03-26 ENCOUNTER — Other Ambulatory Visit: Payer: Self-pay

## 2024-03-26 NOTE — Progress Notes (Signed)
 Specialty Pharmacy Refill Coordination Note  Nancy Reeves is a 43 y.o. female contacted today regarding refills of specialty medication(s) Adalimumab  (Humira  (2 Pen))   Patient requested Delivery   Delivery date: 03/30/24   Verified address: [address], [place], [address]   Medication will be filled on 05.12.25.

## 2024-04-05 ENCOUNTER — Ambulatory Visit (HOSPITAL_COMMUNITY): Payer: BC Managed Care – PPO | Admitting: Clinical

## 2024-04-19 ENCOUNTER — Encounter (HOSPITAL_COMMUNITY): Payer: Self-pay | Admitting: Clinical

## 2024-04-19 ENCOUNTER — Ambulatory Visit (INDEPENDENT_AMBULATORY_CARE_PROVIDER_SITE_OTHER): Payer: BC Managed Care – PPO | Admitting: Clinical

## 2024-04-19 DIAGNOSIS — F064 Anxiety disorder due to known physiological condition: Secondary | ICD-10-CM | POA: Diagnosis not present

## 2024-04-19 DIAGNOSIS — K5090 Crohn's disease, unspecified, without complications: Secondary | ICD-10-CM | POA: Diagnosis not present

## 2024-04-19 DIAGNOSIS — M797 Fibromyalgia: Secondary | ICD-10-CM | POA: Diagnosis not present

## 2024-04-19 DIAGNOSIS — F32A Depression, unspecified: Secondary | ICD-10-CM

## 2024-04-19 NOTE — Progress Notes (Signed)
 THERAPIST PROGRESS NOTE  Session Time: 3:02-3:53pm  Session #3  Virtual Visit via Video Note  I connected with Nancy Reeves on 04/19/24 at  3:00 PM EDT by a video enabled telemedicine application and verified that I am speaking with the correct person using two identifiers.  Location: Patient: home Provider: home office   I discussed the limitations of evaluation and management by telemedicine and the availability of in person appointments. The patient expressed understanding and agreed to proceed.  I discussed the assessment and treatment plan with the patient. The patient was provided an opportunity to ask questions and all were answered. The patient agreed with the plan and demonstrated an understanding of the instructions.   The patient was advised to call back or seek an in-person evaluation if the symptoms worsen or if the condition fails to improve as anticipated.  I provided 51 minutes of non-face-to-face time during this encounter.  Ancel Kass, LCSW   Participation Level: Active  Behavioral Response: Casual and Well Groomed Alert Euthymic and tearful  Type of Therapy: Individual Therapy  Treatment Goals addressed:  STG: Score less than 9 on the PHQ-9 and less than 5 on the GAD-7 as evidenced by intermittent administration of the questionnaires to determine progress in managing depression and anxiety.    STG: Process life events to the extent needed so that will be able to move forward with various areas of life in a better frame of mind per self-report.    STG: Learn and practice communication techniques such as active listening, "I" statements, open-ended questions, reflective listening, assertiveness, fair fighting rules, initiating conversations, and more as necessary and taught in session.    LTG: Identify and decrease cognitive distortions contributing negatively to mood and behavior by identifying 5-7 cognitive distortions that are present; learn  how to come up with replacement thoughts that are more balanced, realistic, and helpful.    STG: Explore personal core beliefs, rules and assumptions, and cognitive distortions through therapist using Cognitive Behavioral Therapy; learn about Behavioral Activation and Acting As If.    STG: Learn breathing techniques and grounding techniques at an age-appropriate and ability-appropriate level and demonstrate mastery in session then report independent use of these skills out of session.    STG: Learn about boundary types, how to implement them, and how to enforce them so that feels more empowered and content with being able to maintain more helpful, appropriate boundaries in the future for a more balanced result.    STG: Work to Arts development officer from models like CBT, Stages of Change, DBT, shame resilience theory, ACT, SFBT, MI, trauma-informed therapy and others to be able to manage mental health symptoms, AEB practicing out of session and reporting back. LTG: Learn appropriate and relevant therapeutic techniques to manage physical pain and regularly use scaling methods to determine effectiveness of the strategies used.   STG: Discuss issues with insomnia and learn more about sleep hygiene; learn and practice tools until sleep is improved per self-report.     ProgressTowards Goals: Progressing  Interventions: Supportive and Meditation: grounding techniques  Summary: Nancy Reeves is a 43 y.o. female who presents with Crohn's disease and fibromyalgia who is having a lot of anxiety for the last 2 months and presents for therapy. She presented oriented x5 and stated she was feeling "overwhelmed, was gone most of May on different trips."  CSW evaluated patient's medication compliance, use of coping tools, and self-care, as applicable.  She provided an update on various aspects  of her life that are normally discussed in therapy, including her health, finances, concerns and anxieties she is dealing with,  and more.  She shared about the various struggles with the different trips she took, then focused on how concerned she is to make enough money that her husband does not have to stress about having to get a "back-breaking" job while he is still recovering from his work-related injury.  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  CSW pointed out that this is not a decision that has to be made immediately, but can be worked into as the time and income develops.  She is very concerned about the condition of her house, and this was discussed.  She was also struggling with anxiety and feeling overwhelmed frequently, both about life issues and about her medical issues.  CSW guided her through learning the 5-4-3-2-1 grounding technique, then demonstrated how this could also be used to not only ground herself but also to make progress on the house.  She got her first tattoo, which is about everything being relative and being something that can be worked on.  CSW related this to the fact that perfectionism is not helpful.  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  Now her goals are different and she can have more stability by refraining from that mindset, rather than losing everything every 3 years as she has done in the past due to her Crohn's disease.    Suicidal/Homicidal: No without intent/plan  Therapist Response: Patient is progressing AEB engaging in scheduled therapy session.  Throughout the session, CSW gave patient the opportunity to explore thoughts and feelings associated with current life situations and past/present stressors.   CSW challenged patient gently and appropriately to consider different ways of looking at reported issues. CSW encouraged  patient's expression of feelings and validated these using empathy, active listening, open body language, and unconditional positive regard.   CSW scheduled 3 more individual appointments and 1 group session.  She state she has been struggling with not seeing therapist frequently enough.  Plan/Recommendations: Return to next session on 7/9, use the 5-4-3-2-1 grounding technique as appropriate and needed for pain control, regaining attention, anxiety, and starting to do a little cleaning in the home  Diagnosis:  Anxiety disorder due to known physiological condition  Depression, unspecified depression type  Collaboration of Care: Other - none needed currently  Patient/Guardian was advised Release of Information must be obtained prior to any record release in order to collaborate their care with an outside provider. Patient/Guardian was advised if they have not already done so to contact the registration department to sign all necessary forms in order for us  to release information regarding their care.   Consent: Patient/Guardian gives verbal consent for treatment and assignment of benefits for services provided during this visit. Patient/Guardian expressed understanding and agreed to proceed.   Ancel Kass, LCSW 04/19/2024

## 2024-04-21 ENCOUNTER — Other Ambulatory Visit: Payer: Self-pay

## 2024-04-23 ENCOUNTER — Other Ambulatory Visit: Payer: Self-pay

## 2024-04-23 NOTE — Progress Notes (Signed)
 Specialty Pharmacy Refill Coordination Note  Nancy Reeves is a 43 y.o. female contacted today regarding refills of specialty medication(s) Adalimumab  (Humira  (2 Pen))   Patient requested Delivery   Delivery date: 04/28/24   Verified address: [address], [place], [address]   Medication will be filled on 06.10.25.

## 2024-04-27 ENCOUNTER — Other Ambulatory Visit: Payer: Self-pay

## 2024-04-28 ENCOUNTER — Other Ambulatory Visit (HOSPITAL_COMMUNITY): Payer: Self-pay

## 2024-04-28 ENCOUNTER — Other Ambulatory Visit: Payer: Self-pay

## 2024-04-28 NOTE — Progress Notes (Signed)
 Medication delivery was delayed due to waiting for premium to process. Patient called today and insurance had updated coverage. Patient to pick up medication from Nancy Reeves on 04/29/24

## 2024-04-29 ENCOUNTER — Other Ambulatory Visit: Payer: Self-pay

## 2024-04-29 NOTE — Progress Notes (Signed)
 Patient aware co-pay card funds exhausted for the year but she states she has already met her out of pocket with her insurance and was able to pick up her medication with no issues.

## 2024-05-05 ENCOUNTER — Encounter (HOSPITAL_COMMUNITY): Payer: Self-pay | Admitting: Clinical

## 2024-05-05 ENCOUNTER — Ambulatory Visit (INDEPENDENT_AMBULATORY_CARE_PROVIDER_SITE_OTHER): Admitting: Clinical

## 2024-05-05 DIAGNOSIS — F32A Depression, unspecified: Secondary | ICD-10-CM

## 2024-05-05 DIAGNOSIS — F064 Anxiety disorder due to known physiological condition: Secondary | ICD-10-CM | POA: Diagnosis not present

## 2024-05-05 NOTE — Progress Notes (Signed)
 THERAPIST PROGRESS NOTE  Session Time: 5:06pm-5:53pm  Session #4  Virtual Visit via Video Note  I connected with Nancy Reeves on 05/05/24 at  5:00 PM EDT by a video enabled telemedicine application and verified that I am speaking with the correct person using two identifiers.  Location: Patient: home Provider: home office   I discussed the limitations of evaluation and management by telemedicine and the availability of in person appointments. The patient expressed understanding and agreed to proceed.  I discussed the assessment and treatment plan with the patient. The patient was provided an opportunity to ask questions and all were answered. The patient agreed with the plan and demonstrated an understanding of the instructions.   The patient was advised to call back or seek an in-person evaluation if the symptoms worsen or if the condition fails to improve as anticipated.  I provided 47 minutes of non-face-to-face time during this encounter.  Ancel Kass, LCSW   Participation Level: Active  Behavioral Response: Casual Alert Euthymic  Type of Therapy: Individual Therapy  Treatment Goals addressed:  STG: Score less than 9 on the PHQ-9 and less than 5 on the GAD-7 as evidenced by intermittent administration of the questionnaires to determine progress in managing depression and anxiety.    STG: Process life events to the extent needed so that will be able to move forward with various areas of life in a better frame of mind per self-report.    STG: Learn and practice communication techniques such as active listening, I statements, open-ended questions, reflective listening, assertiveness, fair fighting rules, initiating conversations, and more as necessary and taught in session.    LTG: Identify and decrease cognitive distortions contributing negatively to mood and behavior by identifying 5-7 cognitive distortions that are present; learn how to come up with  replacement thoughts that are more balanced, realistic, and helpful.    STG: Explore personal core beliefs, rules and assumptions, and cognitive distortions through therapist using Cognitive Behavioral Therapy; learn about Behavioral Activation and Acting As If.    STG: Learn breathing techniques and grounding techniques at an age-appropriate and ability-appropriate level and demonstrate mastery in session then report independent use of these skills out of session.    STG: Learn about boundary types, how to implement them, and how to enforce them so that feels more empowered and content with being able to maintain more helpful, appropriate boundaries in the future for a more balanced result.    STG: Work to Arts development officer from models like CBT, Stages of Change, DBT, shame resilience theory, ACT, SFBT, MI, trauma-informed therapy and others to be able to manage mental health symptoms, AEB practicing out of session and reporting back. LTG: Learn appropriate and relevant therapeutic techniques to manage physical pain and regularly use scaling methods to determine effectiveness of the strategies used.   STG: Discuss issues with insomnia and learn more about sleep hygiene; learn and practice tools until sleep is improved per self-report.     ProgressTowards Goals: Progressing  Interventions: Supportive and Meditation: grounding techniques  Summary: Nancy Reeves is a 43 y.o. female who presents with Crohn's disease and fibromyalgia who is having a lot of anxiety for the last 2 months and presents for therapy. She presented oriented x5 and stated she was feeling better, had a good week, trying not to spiral.  CSW evaluated patient's medication compliance, use of coping tools, and self-care, as applicable.  She provided an update on various aspects of her life that are  normally discussed in therapy, including sleep issues, fears re father, disability questions, and her health.  She has had more energy  than usual lately, often stays awake until somewhere between 4am and 7am, then will sleep until around 10am and get up to take care of the day's events.  She did start to notice heart palpitations, so started taking 1- or 2-hour naps as well.  She was given positive strokes for responding to her body without judgment and for adding the naps.  She also had little energy this past weekend, did not judge herself, so most of the praise provided was focused on not judging herself and allowing herself to honor whatever her body is calling for in the moment.  She shared concerns about her father whom she believes is drinking himself to death, stated she has accepted that she is going to get a phone call one day that he is gone.  CSW explained the concept of anticipatory grief to which she could relate.  She does feel she is getting better with boundaries which have been discussed in last few sessions.  When she starts to feel guilty about something, she was reminded, she has CBT tools to use.  CSW demonstrated how to use the questions (1) is this based in fact, (2) is this helpful, (3) what would I tell a friend, (4) what would be a different way to look at this that would be more realistic and helpful.  She shared that she is having less and less of the impostor syndrome in her job.  We also discussed that she got an extension to recertify for her disability, that it is likely a good thing to go through with that and give it up if she starts making a lot more money, rather than go ahead and give it up and possibly not make enough money.  Finally, she disclosed that she has not been taking her immunosuppressant.  This was discussed in full, examples given, and she took the medicine, stated she will start doing this regularly.    Suicidal/Homicidal: No without intent/plan  Therapist Response: Patient is progressing AEB engaging in scheduled therapy session.  Throughout the session, CSW gave patient the opportunity to  explore thoughts and feelings associated with current life situations and past/present stressors.   CSW challenged patient gently and appropriately to consider different ways of looking at reported issues. CSW encouraged patient's expression of feelings and validated these using empathy, active listening, open body language, and unconditional positive regard.   CSW scheduled 3 more individual appointments and 1 group session.  She state she has been struggling with not seeing therapist frequently enough.  Plan/Recommendations: Return to next session on 7/9, use the 5-4-3-2-1 grounding technique as appropriate and needed for pain control, regaining attention, anxiety, and starting to do a little cleaning in the home  Diagnosis:  Anxiety disorder due to known physiological condition  Depression, unspecified depression type  Collaboration of Care: Other - none needed currently  Patient/Guardian was advised Release of Information must be obtained prior to any record release in order to collaborate their care with an outside provider. Patient/Guardian was advised if they have not already done so to contact the registration department to sign all necessary forms in order for us  to release information regarding their care.   Consent: Patient/Guardian gives verbal consent for treatment and assignment of benefits for services provided during this visit. Patient/Guardian expressed understanding and agreed to proceed.   Ancel Kass, LCSW 05/05/2024

## 2024-05-18 ENCOUNTER — Other Ambulatory Visit (HOSPITAL_COMMUNITY): Payer: Self-pay

## 2024-05-20 ENCOUNTER — Other Ambulatory Visit: Payer: Self-pay

## 2024-05-20 NOTE — Progress Notes (Signed)
 Specialty Pharmacy Refill Coordination Note  Nancy Reeves is a 43 y.o. female contacted today regarding refills of specialty medication(s) Adalimumab  (Humira  (2 Pen))   Patient requested Delivery   Delivery date: 05/25/24   Verified address: [address], [place], [address]   Medication will be filled on 07.07.25.

## 2024-05-24 ENCOUNTER — Other Ambulatory Visit: Payer: Self-pay

## 2024-05-26 ENCOUNTER — Encounter (HOSPITAL_COMMUNITY): Payer: Self-pay | Admitting: Clinical

## 2024-05-26 ENCOUNTER — Ambulatory Visit (INDEPENDENT_AMBULATORY_CARE_PROVIDER_SITE_OTHER): Admitting: Clinical

## 2024-05-26 DIAGNOSIS — F331 Major depressive disorder, recurrent, moderate: Secondary | ICD-10-CM

## 2024-05-26 DIAGNOSIS — F064 Anxiety disorder due to known physiological condition: Secondary | ICD-10-CM | POA: Diagnosis not present
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
## 2024-06-09 ENCOUNTER — Encounter (HOSPITAL_COMMUNITY): Payer: Self-pay | Admitting: Clinical

## 2024-06-09 ENCOUNTER — Ambulatory Visit (INDEPENDENT_AMBULATORY_CARE_PROVIDER_SITE_OTHER): Admitting: Clinical

## 2024-06-09 DIAGNOSIS — F331 Major depressive disorder, recurrent, moderate: Secondary | ICD-10-CM | POA: Diagnosis not present

## 2024-06-09 DIAGNOSIS — F064 Anxiety disorder due to known physiological condition: Secondary | ICD-10-CM | POA: Diagnosis not present
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
## 2024-06-14 ENCOUNTER — Other Ambulatory Visit (HOSPITAL_COMMUNITY): Payer: Self-pay

## 2024-06-16 ENCOUNTER — Other Ambulatory Visit: Payer: Self-pay

## 2024-06-17 ENCOUNTER — Other Ambulatory Visit: Payer: Self-pay

## 2024-06-21 ENCOUNTER — Other Ambulatory Visit: Payer: Self-pay

## 2024-06-21 NOTE — Progress Notes (Signed)
 Specialty Pharmacy Refill Coordination Note  Nancy Reeves is a 43 y.o. female contacted today regarding refills of specialty medication(s) Adalimumab  (Humira  (2 Pen))   Patient requested Delivery   Delivery date: 06/22/24   Verified address: [address], [place], [address]   Medication will be filled on 08.04.25.

## 2024-06-23 ENCOUNTER — Ambulatory Visit (HOSPITAL_COMMUNITY): Admitting: Clinical

## 2024-06-23 ENCOUNTER — Other Ambulatory Visit (HOSPITAL_COMMUNITY): Payer: Self-pay

## 2024-06-23 ENCOUNTER — Encounter (HOSPITAL_COMMUNITY): Payer: Self-pay | Admitting: Clinical

## 2024-06-23 DIAGNOSIS — F331 Major depressive disorder, recurrent, moderate: Secondary | ICD-10-CM | POA: Diagnosis not present

## 2024-06-23 DIAGNOSIS — F064 Anxiety disorder due to known physiological condition: Secondary | ICD-10-CM

## 2024-06-23 NOTE — Progress Notes (Signed)
 THERAPIST PROGRESS NOTE  Session Time: 3:04pm-3:58pm  Session #7  Virtual Visit via Video Note  I connected with Nancy Reeves on 06/23/24 at  3:00 PM EDT by a video enabled telemedicine application and verified that I am speaking with the correct person using two identifiers.  Location: Patient: home Provider: [facility] outpatient therapy office - [place]    I discussed the limitations of evaluation and management by telemedicine and the availability of in person appointments. The patient expressed understanding and agreed to proceed.  I discussed the assessment and treatment plan with the patient. The patient was provided an opportunity to ask questions and all were answered. The patient agreed with the plan and demonstrated an understanding of the instructions.   The patient was advised to call back or seek an in-person evaluation if the symptoms worsen or if the condition fails to improve as anticipated.  I provided 54 minutes of non-face-to-face time during this encounter.  [name], LCSW   Participation Level: Active  Behavioral Response: Casual Alert Angry, Depressed, Worthless, and Tearful  Type of Therapy: Individual Therapy  Treatment Goals addressed:  STG: Score less than 9 on the PHQ-9 and less than 5 on the GAD-7 as evidenced by intermittent administration of the questionnaires to determine progress in managing depression and anxiety.    STG: Process life events to the extent needed so that will be able to move forward with various areas of life in a better frame of mind per self-report.    STG: Learn and practice communication techniques such as active listening, I statements, open-ended questions, reflective listening, assertiveness, fair fighting rules, initiating conversations, and more as necessary and taught in session.    LTG: Identify and decrease cognitive distortions contributing negatively to mood and behavior by identifying 5-7 cognitive  distortions that are present; learn how to come up with replacement thoughts that are more balanced, realistic, and helpful.    STG: Explore personal core beliefs, rules and assumptions, and cognitive distortions through therapist using Cognitive Behavioral Therapy; learn about Behavioral Activation and Acting As If.    STG: Learn breathing techniques and grounding techniques at an age-appropriate and ability-appropriate level and demonstrate mastery in session then report independent use of these skills out of session.    STG: Learn about boundary types, how to implement them, and how to enforce them so that feels more empowered and content with being able to maintain more helpful, appropriate boundaries in the future for a more balanced result.    STG: Work to Arts development officer from models like CBT, Stages of Change, DBT, shame resilience theory, ACT, SFBT, MI, trauma-informed therapy and others to be able to manage mental health symptoms, AEB practicing out of session and reporting back. LTG: Learn appropriate and relevant therapeutic techniques to manage physical pain and regularly use scaling methods to determine effectiveness of the strategies used.   STG: Discuss issues with insomnia and learn more about sleep hygiene; learn and practice tools until sleep is improved per self-report.     ProgressTowards Goals: Progressing  Interventions: CBT, Psychosocial Skills: communication skills, pain management skills, and Supportive  Summary: Nancy Reeves is a 43 y.o. female who presents with Crohn's disease and fibromyalgia who is having a lot of anxiety for the last 2 months and presents for therapy. She presented oriented x5 and stated she was feeling It's been a rough few days but I'm okay.  CSW evaluated patient's medication compliance, use of coping tools, and self-care, as  applicable.  She provided an update on various aspects of her life that are normally discussed in therapy, including the  deterioration of father's health and family's attempts to get her to take him in to her home, a work retreat she attended during which she was hurt by two co-workers seeming to belittle her, and new thoughts she has about perfectionism.  She was irritated with cousin persistently trying to get patient to come take father since he was being evicted from a facility due to keeping his urine and feces in his room, saying there is nothing she can do.  CSW pointed out that is probably also how the cousin feels and suggested an APS report be done.  This was done previously with no result so she is reluctant, but CSW encouraged her to do one anyway, since often a history of need is built over time while several reports make the need more apparent.  Most of the session was spent in her talking about a retreat she went on in which 2 ladies snickered about her and then challenged her about whether she was going to contribute to a gift, which she interpreted as them hinting she could not afford to do so.  She is sensitive about being on disability and not having a good primary income like these other ladies presumably do.  CSW was able to point out cognitive distortions, but she refuted them, saying As black women, we know when somebody is talking about us .  CSW switched to just supporting her in the fact that she felt betrayed and that she wants to eventually talk to them about what happened.  She was provided positive feedback for not doing this immediately while she is still angry, because she stated she would go off on them.  CSW supplied suggestions of wording with I messages.  She calmed considerably and stopped crying during session.  She also found out that her own work Psychologist, occupational has social anxiety and she witnessed the bad state of her coach's house that resulted, which made her feel better that other people also have problems.  She stated this led her to a breakthrough during this retreats about needing to have  perfection in all she does.  In other matters, such as the tax problem reported at last session, she is waiting for the accountant to tell them next steps and is no longer worried about that.  She has also spoken to her disability representative and believes that her May-July payments will be released soon.  We talked also about techniques she uses to handle her pain, as she was having pain during session.  Suicidal/Homicidal: No without intent/plan  Therapist Response: Patient is progressing AEB engaging in scheduled therapy session.  Throughout the session, CSW gave patient the opportunity to explore thoughts and feelings associated with current life situations and past/present stressors.   CSW challenged patient gently and appropriately to consider different ways of looking at reported issues. CSW encouraged patient's expression of feelings and validated these using empathy, active listening, open body language, and unconditional positive regard.     Plan/Recommendations: Return to next session on 8/20, when she confronts co-workers, try to use I statements as taught in session, try to use breathing techniques with pain  Diagnosis:  Moderate episode of recurrent major depressive disorder (HCC)  Anxiety disorder due to known physiological condition  Collaboration of Care: Other - none needed currently  Patient/Guardian was advised Release of Information must be obtained prior to any record  release in order to collaborate their care with an outside provider. Patient/Guardian was advised if they have not already done so to contact the registration department to sign all necessary forms in order for us  to release information regarding their care.   Consent: Patient/Guardian gives verbal consent for treatment and assignment of benefits for services provided during this visit. Patient/Guardian expressed understanding and agreed to proceed.   [name], LCSW 06/23/2024

## 2024-07-07 ENCOUNTER — Ambulatory Visit (HOSPITAL_COMMUNITY): Admitting: Clinical

## 2024-07-07 ENCOUNTER — Encounter: Payer: Self-pay | Admitting: Internal Medicine

## 2024-07-07 ENCOUNTER — Other Ambulatory Visit (INDEPENDENT_AMBULATORY_CARE_PROVIDER_SITE_OTHER)

## 2024-07-07 ENCOUNTER — Ambulatory Visit: Admitting: Internal Medicine

## 2024-07-07 VITALS — BP 120/70 | Ht 64.0 in | Wt 221.0 lb

## 2024-07-07 DIAGNOSIS — F064 Anxiety disorder due to known physiological condition: Secondary | ICD-10-CM

## 2024-07-07 DIAGNOSIS — Z79620 Long term (current) use of immunosuppressive biologic: Secondary | ICD-10-CM

## 2024-07-07 DIAGNOSIS — R12 Heartburn: Secondary | ICD-10-CM

## 2024-07-07 DIAGNOSIS — F331 Major depressive disorder, recurrent, moderate: Secondary | ICD-10-CM

## 2024-07-07 DIAGNOSIS — K50111 Crohn's disease of large intestine with rectal bleeding: Secondary | ICD-10-CM

## 2024-07-07 DIAGNOSIS — R197 Diarrhea, unspecified: Secondary | ICD-10-CM | POA: Diagnosis not present

## 2024-07-07 DIAGNOSIS — K50119 Crohn's disease of large intestine with unspecified complications: Secondary | ICD-10-CM | POA: Diagnosis not present

## 2024-07-07 DIAGNOSIS — K6289 Other specified diseases of anus and rectum: Secondary | ICD-10-CM | POA: Diagnosis not present

## 2024-07-07 DIAGNOSIS — K602 Anal fissure, unspecified: Secondary | ICD-10-CM | POA: Diagnosis not present

## 2024-07-07 DIAGNOSIS — K50118 Crohn's disease of large intestine with other complication: Secondary | ICD-10-CM

## 2024-07-07 DIAGNOSIS — K219 Gastro-esophageal reflux disease without esophagitis: Secondary | ICD-10-CM

## 2024-07-07 DIAGNOSIS — Z5181 Encounter for therapeutic drug level monitoring: Secondary | ICD-10-CM

## 2024-07-07 DIAGNOSIS — R110 Nausea: Secondary | ICD-10-CM

## 2024-07-07 LAB — CBC WITH DIFFERENTIAL/PLATELET
Basophils Absolute: 0 K/uL (ref 0.0–0.1)
Basophils Relative: 0.4 % (ref 0.0–3.0)
Eosinophils Absolute: 0.5 K/uL (ref 0.0–0.7)
Eosinophils Relative: 7.2 % — ABNORMAL HIGH (ref 0.0–5.0)
HCT: 37.4 % (ref 36.0–46.0)
Hemoglobin: 12.1 g/dL (ref 12.0–15.0)
Lymphocytes Relative: 29.1 % (ref 12.0–46.0)
Lymphs Abs: 1.9 K/uL (ref 0.7–4.0)
MCHC: 32.4 g/dL (ref 30.0–36.0)
MCV: 77.6 fl — ABNORMAL LOW (ref 78.0–100.0)
Monocytes Absolute: 0.5 K/uL (ref 0.1–1.0)
Monocytes Relative: 7.6 % (ref 3.0–12.0)
Neutro Abs: 3.6 K/uL (ref 1.4–7.7)
Neutrophils Relative %: 55.7 % (ref 43.0–77.0)
Platelets: 267 K/uL (ref 150.0–400.0)
RBC: 4.82 Mil/uL (ref 3.87–5.11)
RDW: 14.9 % (ref 11.5–15.5)
WBC: 6.4 K/uL (ref 4.0–10.5)

## 2024-07-07 LAB — COMPREHENSIVE METABOLIC PANEL WITH GFR
ALT: 32 U/L (ref 0–35)
AST: 39 U/L — ABNORMAL HIGH (ref 0–37)
Albumin: 4 g/dL (ref 3.5–5.2)
Alkaline Phosphatase: 55 U/L (ref 39–117)
BUN: 11 mg/dL (ref 6–23)
CO2: 28 meq/L (ref 19–32)
Calcium: 9.1 mg/dL (ref 8.4–10.5)
Chloride: 103 meq/L (ref 96–112)
Creatinine, Ser: 0.8 mg/dL (ref 0.40–1.20)
GFR: 90.07 mL/min (ref >60.00)
Glucose, Bld: 100 mg/dL — ABNORMAL HIGH (ref 70–99)
Potassium: 4.2 meq/L (ref 3.5–5.1)
Sodium: 137 meq/L (ref 135–145)
Total Bilirubin: 0.3 mg/dL (ref 0.2–1.2)
Total Protein: 8.7 g/dL — ABNORMAL HIGH (ref 6.0–8.3)

## 2024-07-07 LAB — HIGH SENSITIVITY CRP: CRP, High Sensitivity: 2.99 mg/L (ref 0.000–5.000)

## 2024-07-07 MED ORDER — HYOSCYAMINE SULFATE 0.125 MG SL SUBL: SUBLINGUAL_TABLET | SUBLINGUAL | 11 refills | Status: AC

## 2024-07-07 MED ORDER — PANTOPRAZOLE SODIUM 40 MG PO TBEC
40.0000 mg | DELAYED_RELEASE_TABLET | Freq: Every day | ORAL | 3 refills | Status: AC
Start: 2024-07-07 — End: ?

## 2024-07-07 NOTE — Progress Notes (Signed)
 Subjective:    Patient ID: Nancy Reeves, female    DOB: [birth year], 43 y.o.   MRN: [id number]  HPI Evamaria Detore is a 43 year old female with Crohn's colitis with perianal involvement who presents for follow-up.  She was last seen on October 23, 2023, when her azathioprine  was increased to 100 mg daily while continuing Humira  40 mg weekly. An exam under anesthesia on November 07, 2023, was performed by Dr. Bernarda Ned; the patient recalls that this was done to evaluate her perianal symptoms. An MRI of the pelvis in November 2024 showed diffuse wall thickening and hyperenhancement of the rectum and anus with multiple perirectal lymph nodes. Her last colonoscopy in July 2024 showed skin tags and nodularity in the anal canal without stenosis, and a 2 mm cecal polyp was removed. Biopsies from the anus showed squamous columnar junctional mucosa with ulceration and granulation tissue. Distal rectal biopsies showed some reactive reparative change. A small cecal polyp was an adenoma.  She experiences loose stools, sometimes solid but soft, occurring a few times a day. Bleeding occurs, particularly when stool is not fully evacuated, leading to a tearing sensation and subsequent bleeding. Abdominal pain is described as feeling like 'somebody's kicking me in the stomach' with sharp pains that cause her to 'randomly scream'. She has been using Humira  weekly, with her last dose on Thursday, and azathioprine  100 mg daily, though she missed some doses while traveling to [place]  due to her father's situation. She also takes Pepcid  (famotidine ) once a day but experiences significant heartburn.  No dysphagia or odynophagia.  She does have some nausea without vomiting.  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 Her adalimumab  level on October 23, 2023, was 17 without antibodies. She has not been using Levsin  for abdominal pain due to insurance issues and has run out of tramadol  for pain management.  She is concerned about the unpredictability of her bowel movements, which affects her ability to work, as she was previously fired for spending too much time in the bathroom.  Socially, she has been traveling to [place]  to assist with her father's care, who is currently in a nursing home. She discusses her experience with disability claims and the scrutiny she faces regarding her ability to work, including being monitored on social media. She is frustrated with the situation and the impact on her quality of life.  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  She does feel at this point given overall improvement compared to late 2024 if she could perform desk type work however worries about the need to miss work when her disease flares as well as frequent and unpredictable bathroom breaks.  At times due to loose stools her bathroom breaks can be rather lengthy.  She states that her previous employers have not been as accommodating with her frequent bathroom breaks.  Review of Systems As per HPI, otherwise negative  Current Medications, Allergies, Past Medical History, Past Surgical History, Family History and Social History were reviewed in [employer] record.    Objective:   Physical Exam BP 120/70   Ht 5' 4 (1.626 m)   Wt 221 lb (100.2 kg)   BMI 37.93 kg/m  Gen: awake, alert, NAD HEENT: anicteric  CV: RRR, no mrg Pulm: CTA b/l Abd: soft, diffusely tender without rebound or guarding, +BS throughout Ext: no c/c/e Neuro: nonfocal  LABS Fecal calprotectin: 854 (10/27/2023) Fecal calprotectin: 1520 (09/30/2023) Adalimumab : 17 (10/23/2023)  RADIOLOGY MRI pelvis: Diffuse wall thickening and hyperenhancement of the rectum and anus with multiple perirectal lymph nodes consistent with active inflammatory bowel disease. Fissure or very short fistula tract and skin thickening around the anal verge consistent with cellulitis.  (09/2023)  DIAGNOSTIC Colonoscopy: Skin tags and nodularity in the anal canal without stenosis. 2 mm cecal polyp removed. No evidence of active inflammation. Nodular and pseudopolypoid ulcerative mucosa of the anus was mild. (05/2023)  PATHOLOGY Anus biopsy: Squamous columnar junctional mucosa with ulceration and granulation tissue. Negative for viral involvement. (05/2023) Distal rectal biopsy: No acute inflammation but with some reactive reparative change. (05/2023)      Assessment & Plan:   Crohn's colitis with perianal involvement complicated by anal fissures, abdominal pain, and rectal bleeding (official dx Jan 2023, but prior surgical intervention revealed chronic and active inflammation of the perianal tissue back in 2020) Chronic Crohn's disease with perianal involvement, confirmed rectal inflammation and fissures. Symptoms include loose stools, abdominal pain, and rectal bleeding. Elevated fecal calprotectin supports active inflammation. Current treatment includes Humira  40 mg weekly and azathioprine  100 mg daily.  Considered flexible sigmoidoscopy if symptoms persist. Discussed work accommodations due to unpredictable bowel habits and the chronic nature of her Crohn's disease. - Continue Humira  40 mg weekly. - Continue azathioprine  100 mg daily. - Order adalimumab  (Humira ) level today which is trough. - Order fecal calprotectin. - Order pathogen panel plus C. diff. - Order CBC, CMP, CRP. - Order quantiferon gold if not done in the last year. - Consider flexible sigmoidoscopy if symptoms do not improve. - Rewrite prescription for Levsin  for abdominal pain and cramping.  Heartburn/GERD Chronic heartburn not controlled with famotidine  40 mg daily. Significant symptoms reported. Previous pantoprazole  use was effective. - Discontinue famotidine . - Start pantoprazole  40 mg once daily. - Use famotidine  in the evening for breakthrough symptoms if needed.  Disability as it relates  IBD I received a letter from her [place] healthcare specialty benefits team questioning the ongoing validity of her total disability.  They note that she had posted on social media that she was on a [place] vacation and in [place] during May 2025.  The patient and I discussed this at length today.  There has been clinical improvement on current therapy, though noting that she still has significant symptoms related to what is ongoing active Crohn's disease.  Her work is sedentary and it is reasonable that she could likely return to the sedentary work as long as there was significant accommodation for the unpredictability of her Crohn's disease, flares which involve abdominal pain, diarrhea and rectal bleeding.  Her Crohn's disease will likely require missed days, frequent bathroom breaks which can be prolonged.  She continues to worry about the ability of her to hold down a job and her potential employers flexibility with her accommodation. -We discussed how I will return [place] [employer]'s request for reevaluation of her disability based on today's visit.  They asked the question do I agree that as of 07/07/2024 valuation Ms. Sofranko is capable of performing the full-time demands of the above described sedentary occupation?  My response will be yes I agree with accommodation.  50 minutes total spent today including patient facing time, coordination of care, reviewing medical history/procedures/pertinent radiology studies, and documentation of the encounter.

## 2024-07-07 NOTE — Progress Notes (Unsigned)
 THERAPIST PROGRESS NOTE  Session Time: 3:05pm-4:00pm  Session #8  Virtual Visit via Video Note  I connected with Efrain Louder on 07/09/24 at  3:00 PM EDT by a video enabled telemedicine application and verified that I am speaking with the correct person using two identifiers.  Location: Patient: home Provider: [facility] outpatient therapy office - [place]    I discussed the limitations of evaluation and management by telemedicine and the availability of in person appointments. The patient expressed understanding and agreed to proceed.  I discussed the assessment and treatment plan with the patient. The patient was provided an opportunity to ask questions and all were answered. The patient agreed with the plan and demonstrated an understanding of the instructions.   The patient was advised to call back or seek an in-person evaluation if the symptoms worsen or if the condition fails to improve as anticipated.  I provided 55 minutes of non-face-to-face time during this encounter.  [name], LCSW   Participation Level: Active  Behavioral Response: Casual Alert Euthymic  Type of Therapy: Individual Therapy  Treatment Goals addressed:  STG: Score less than 9 on the PHQ-9 and less than 5 on the GAD-7 as evidenced by intermittent administration of the questionnaires to determine progress in managing depression and anxiety.    STG: Process life events to the extent needed so that will be able to move forward with various areas of life in a better frame of mind per self-report.    STG: Learn and practice communication techniques such as active listening, I statements, open-ended questions, reflective listening, assertiveness, fair fighting rules, initiating conversations, and more as necessary and taught in session.    LTG: Identify and decrease cognitive distortions contributing negatively to mood and behavior by identifying 5-7 cognitive distortions that are present;  learn how to come up with replacement thoughts that are more balanced, realistic, and helpful.    STG: Explore personal core beliefs, rules and assumptions, and cognitive distortions through therapist using Cognitive Behavioral Therapy; learn about Behavioral Activation and Acting As If.    STG: Learn breathing techniques and grounding techniques at an age-appropriate and ability-appropriate level and demonstrate mastery in session then report independent use of these skills out of session.    STG: Learn about boundary types, how to implement them, and how to enforce them so that feels more empowered and content with being able to maintain more helpful, appropriate boundaries in the future for a more balanced result.    STG: Work to Arts development officer from models like CBT, Stages of Change, DBT, shame resilience theory, ACT, SFBT, MI, trauma-informed therapy and others to be able to manage mental health symptoms, AEB practicing out of session and reporting back. LTG: Learn appropriate and relevant therapeutic techniques to manage physical pain and regularly use scaling methods to determine effectiveness of the strategies used.   STG: Discuss issues with insomnia and learn more about sleep hygiene; learn and practice tools until sleep is improved per self-report.     ProgressTowards Goals: Progressing  Interventions: CBT, Assertiveness Training, and Supportive   Summary: Toshie Demelo is a 43 y.o. female who presents with Crohn's disease and fibromyalgia who is having a lot of anxiety and presents for therapy. She presented oriented x5 and stated she was feeling busy.  CSW evaluated patient's medication compliance, use of coping tools, and self-care, as applicable.  She provided an update on various aspects of her life that are normally discussed in therapy, including status of  her father's health and disposition, boundaries she has implemented with herself and with her sister recently, and decision  to stop fighting to stay on disability.  Her father is currently in the hospital and Adult Protective Services is involved, is helping them to find him a place to go, which looks like it will be a memory care facility.  She and siblings went to tour the facility and felt good about it.  On the way home, she enforced boundaries with herself about how tired she was, so rested on the way and expressed pride in herself for choosing to do this.  She did have a follow-up appointment with her gastroenterologist and explained that he received a threatening letter from her insurance company about whether her Crohn's disease is continuing to prevent her from being able to do her sedentary job.  They listed her posts on social media about going on vacation as being the reason they believe she can engage in normal activities.  She stated he felt forced to agree that she could return to the sedentary job in order to protect his license.  She forwarded copies of the letter to CSW.  She cannot truly go back to work, however, because she was actually fired illegally even prior to being put on disability.  CSW suggested she could ask the company if she could go back, since that is what they are suggesting, but she does not want to fight the case any longer or have any more dealings with these entities.  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  She takes this development as a sign from God that she needs to let the disability payments go and not use them as a crutch.  She also shared that she has blocked her sister who is quite toxic from being able to contact her, intending to leave it like that rather than undo it as she has done in the past.  CSW supported her boundary efforts and assisted her with looking at her own thoughts that might be distorted as well as sister's potential thoughts that could be distorted and coming from core beliefs over which patient  has no control, but which she could understand.  Suicidal/Homicidal: No without intent/plan  Therapist Response: Patient is progressing AEB engaging in scheduled therapy session.  Throughout the session, CSW gave patient the opportunity to explore thoughts and feelings associated with current life situations and past/present stressors.   CSW challenged patient gently and appropriately to consider different ways of looking at reported issues. CSW encouraged patient's expression of feelings and validated these using empathy, active listening, open body language, and unconditional positive regard.     Plan/Recommendations: Return to next session on 9/11, continue enforcement of boundaries she has set with herself and others in order to be the healthiest mentally that she can be   Diagnosis:  Moderate episode of recurrent major depressive disorder (HCC)  Anxiety disorder due to known physiological condition  Collaboration of Care: Other - none needed currently  Patient/Guardian was advised Release of Information must be obtained prior to any record release in order to collaborate their care with an outside provider. Patient/Guardian was advised if they have not already done so to contact the registration department to sign all necessary forms in order for us  to release information regarding their care.   Consent: Patient/Guardian gives verbal consent for treatment and assignment of benefits for services provided during  this visit. Patient/Guardian expressed understanding and agreed to proceed.   [name], LCSW 07/09/2024

## 2024-07-07 NOTE — Patient Instructions (Addendum)
 Your provider has requested that you go to the basement level for lab work before leaving today. Press B on the elevator. The lab is located at the first door on the left as you exit the elevator.  We have sent the following medications to your pharmacy for you to pick up at your convenience: levsin  and pantoprazole .  You can take famotidine  as needed at bedtime.  Continue Humira  weekly and azathioprine  daily.   Your provider has ordered [facility] stool testing for you. You have received a kit from our office today containing all necessary supplies to complete this test. Please carefully read the stool collection instructions provided in the kit before opening the accompanying materials. In addition, be sure there is a label providing your full name and date of birth on the puritan opti-swab tube that is supplied in the kit (if you do not see a label with this information on your test tube, please make us  aware before test collection!). After completing the test, you should secure the purtian tube into the specimen biohazard bag. The [facility] E-Req sheet (including date and time of specimen collection) should be placed into the outside pocket of the specimen biohazard bag and returned to the [facility] lab (basement floor of [facility]) within 3 days of collection. Please make sure to give the specimen to a staff member at the lab. DO NOT leave the specimen on the counter.   If the specimen date and time (can be found in the upper right boxed portion of the sheet) are not filled out on the E-Req sheet, the test will NOT be performed.   _______________________________________________________  If your blood pressure at your visit was 140/90 or greater, please contact your primary care physician to follow up on this.  _______________________________________________________  If you are age 44 or older, your body mass index should be between 23-30. Your Body mass  index is 37.93 kg/m. If this is out of the aforementioned range listed, please consider follow up with your Primary Care Provider.  If you are age 44 or younger, your body mass index should be between 19-25. Your Body mass index is 37.93 kg/m. If this is out of the aformentioned range listed, please consider follow up with your Primary Care Provider.   ________________________________________________________  The [facility] providers would like to encourage you to use MYCHART to communicate with providers for non-urgent requests or questions.  Due to long hold times on the telephone, sending your provider a message by [facility] may be a faster and more efficient way to get a response.  Please allow 48 business hours for a response.  Please remember that this is for non-urgent requests.  _______________________________________________________  [facility] is using a team-based approach to care.  Your team is made up of your doctor and two to three APPS. Our APPS (Nurse Practitioners and Physician Assistants) work with your physician to ensure care continuity for you. They are fully qualified to address your health concerns and develop a treatment plan. They communicate directly with your gastroenterologist to care for you. Seeing the Advanced Practice Practitioners on your physician's team can help you by facilitating care more promptly, often allowing for earlier appointments, access to diagnostic testing, procedures, and other specialty referrals.

## 2024-07-08 ENCOUNTER — Ambulatory Visit: Payer: Self-pay | Admitting: Internal Medicine

## 2024-07-08 ENCOUNTER — Other Ambulatory Visit (HOSPITAL_COMMUNITY): Payer: Self-pay

## 2024-07-08 ENCOUNTER — Telehealth: Payer: Self-pay

## 2024-07-08 DIAGNOSIS — K50119 Crohn's disease of large intestine with unspecified complications: Secondary | ICD-10-CM

## 2024-07-08 DIAGNOSIS — Z79899 Other long term (current) drug therapy: Secondary | ICD-10-CM

## 2024-07-08 NOTE — Telephone Encounter (Signed)
 Pharmacy Patient Advocate Encounter   Received notification from CoverMyMeds that prior authorization for Hyoscyamine  0.125 mg sublingual tablet is required/requested.   Insurance verification completed.   The patient is insured through [facility] .   Per test claim: Medication is a plan exclusion and insurance will not pay for.

## 2024-07-09 ENCOUNTER — Other Ambulatory Visit: Payer: Self-pay

## 2024-07-09 ENCOUNTER — Encounter (HOSPITAL_COMMUNITY): Payer: Self-pay | Admitting: Clinical

## 2024-07-10 LAB — QUANTIFERON-TB GOLD PLUS
Mitogen-NIL: 7.72 [IU]/mL
NIL: 0.02 [IU]/mL
QuantiFERON-TB Gold Plus: NEGATIVE
TB1-NIL: 0 [IU]/mL
TB2-NIL: 0 [IU]/mL

## 2024-07-12 ENCOUNTER — Other Ambulatory Visit

## 2024-07-12 ENCOUNTER — Other Ambulatory Visit: Payer: Self-pay

## 2024-07-14 ENCOUNTER — Telehealth: Payer: Self-pay

## 2024-07-14 ENCOUNTER — Other Ambulatory Visit: Payer: Self-pay

## 2024-07-14 NOTE — Telephone Encounter (Signed)
 Received fax [facility] lab resulting patient's stool test for C. Diff. Fax states patient's stool is not detected of C. Diff. Reviewed with Dr. Albertus and he is aware of negative test. Paper copy to be scanned into Epic. Patient notified and patient verbalized understanding.

## 2024-07-15 ENCOUNTER — Other Ambulatory Visit: Payer: Self-pay

## 2024-07-15 NOTE — Progress Notes (Signed)
 Specialty Pharmacy Refill Coordination Note  Nancy Reeves is a 43 y.o. female contacted today regarding refills of specialty medication(s) Adalimumab  (Humira  (2 Pen))   Patient requested Delivery   Delivery date: 07/21/24   Verified address: [address], [place], [address]   Medication will be filled on 09.02.25.

## 2024-07-20 ENCOUNTER — Other Ambulatory Visit: Payer: Self-pay

## 2024-07-21 LAB — ADALIMUMAB+AB (SERIAL MONITOR)
Adalimumab Drug Level: 16 ug/mL
Anti-Adalimumab Antibody: <25 ng/mL

## 2024-07-28 ENCOUNTER — Other Ambulatory Visit (HOSPITAL_COMMUNITY): Payer: Self-pay

## 2024-07-29 ENCOUNTER — Encounter (HOSPITAL_COMMUNITY): Payer: Self-pay | Admitting: Clinical

## 2024-07-29 ENCOUNTER — Ambulatory Visit (HOSPITAL_COMMUNITY): Admitting: Clinical

## 2024-07-29 DIAGNOSIS — F331 Major depressive disorder, recurrent, moderate: Secondary | ICD-10-CM | POA: Diagnosis not present

## 2024-07-29 DIAGNOSIS — F064 Anxiety disorder due to known physiological condition: Secondary | ICD-10-CM

## 2024-07-29 NOTE — Progress Notes (Signed)
 THERAPIST PROGRESS NOTE  Session Time: 2:12pm-3:00pm  Session #9  Virtual Visit via Video Note  I connected with Nancy Reeves on 07/29/24 at  2:00 PM EDT by a video enabled telemedicine application and verified that I am speaking with the correct person using two identifiers.  Location: Patient: home/car not moving Provider: [facility] outpatient therapy office - [place]    I discussed the limitations of evaluation and management by telemedicine and the availability of in person appointments. The patient expressed understanding and agreed to proceed.  I discussed the assessment and treatment plan with the patient. The patient was provided an opportunity to ask questions and all were answered. The patient agreed with the plan and demonstrated an understanding of the instructions.   The patient was advised to call back or seek an in-person evaluation if the symptoms worsen or if the condition fails to improve as anticipated.  I provided 48 minutes of non-face-to-face time during this encounter.  When the connection was lost several times, we switched to telephone for remainder of session.  [name], LCSW   Participation Level: Active  Behavioral Response: Casual Alert Euthymic  Type of Therapy: Individual Therapy  Treatment Goals addressed:  STG: Score less than 9 on the PHQ-9 and less than 5 on the GAD-7 as evidenced by intermittent administration of the questionnaires to determine progress in managing depression and anxiety.    STG: Process life events to the extent needed so that will be able to move forward with various areas of life in a better frame of mind per self-report.    STG: Learn and practice communication techniques such as active listening, I statements, open-ended questions, reflective listening, assertiveness, fair fighting rules, initiating conversations, and more as necessary and taught in session.    LTG: Identify and decrease cognitive  distortions contributing negatively to mood and behavior by identifying 5-7 cognitive distortions that are present; learn how to come up with replacement thoughts that are more balanced, realistic, and helpful.    STG: Explore personal core beliefs, rules and assumptions, and cognitive distortions through therapist using Cognitive Behavioral Therapy; learn about Behavioral Activation and Acting As If.    STG: Learn breathing techniques and grounding techniques at an age-appropriate and ability-appropriate level and demonstrate mastery in session then report independent use of these skills out of session.    STG: Learn about boundary types, how to implement them, and how to enforce them so that feels more empowered and content with being able to maintain more helpful, appropriate boundaries in the future for a more balanced result.    STG: Work to Arts development officer from models like CBT, Stages of Change, DBT, shame resilience theory, ACT, SFBT, MI, trauma-informed therapy and others to be able to manage mental health symptoms, AEB practicing out of session and reporting back. LTG: Learn appropriate and relevant therapeutic techniques to manage physical pain and regularly use scaling methods to determine effectiveness of the strategies used.   STG: Discuss issues with insomnia and learn more about sleep hygiene; learn and practice tools until sleep is improved per self-report.     ProgressTowards Goals: Progressing  Interventions: Assertiveness Training and Supportive   Summary: Nancy Reeves is a 43 y.o. female who presents with Crohn's disease and fibromyalgia who is having a lot of anxiety and presents for therapy. She presented oriented x5 and stated she was feeling good, rough until 2 weeks ago.  CSW evaluated patient's medication compliance, use of coping tools, and self-care,  as applicable.  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  She reported that her Crohn's disease has evolved in the last few weeks to no longer involving constipation, which is a big relief for her.  She is going to gym and losing weight, as is her husband.  She expressed that things are starting to line up and gave detailed examples when CSW asked what this meant.  CSW expressed concern that she is giving herself pep talks about having dedication that could be detrimental to her mental health if that level of rigidity does not pan out financially as she wishes.  For instance, she indicated she wants to live now by the motto, Even fear is scared of me now that CSW gently suggested may not be able to remain true 100% of the time.  We explored this idea for the remainder of the session.  She believes she is doing a good job at learning to be at peace and lay something down when I need to.  She did report that the situation with her sister reported at last session initially threw her off to about 2 weeks of depression, which was not reflected in that last session when she was so positive about the boundary she had set and was determined to maintain it.  While she has remained so determined, it has not been without sinking into a depression.  CSW again expressed concern about her emphasis on her ambition and success and suggested to her that her mental health and physical health are even more important than those things, to which she responded, I completely understand and appreciate you having that concern.  CSW guided her to discuss how she worked her way out of the 2-week depression she just had, reflecting on the coping skills used.  CSW also pointed out to her that whereas her husband used to take care of all the bills, then she took care of all the bills, despite the fact that she does not want to worry him, now is  perhaps the time for them to work together.  Suicidal/Homicidal: No without intent/plan  Therapist Response: Patient is progressing AEB engaging in scheduled therapy session.  Throughout the session, CSW gave patient the opportunity to explore thoughts and feelings associated with current life situations and past/present stressors.   CSW challenged patient gently and appropriately to consider different ways of looking at reported issues. CSW encouraged patient's expression of feelings and validated these using empathy, active listening, open body language, and unconditional positive regard.     Plan/Recommendations: Return to next session on 9/26, continue enforcement of boundaries, keep MH and physical health at the forefront of her economic ambitions  Diagnosis:  Moderate episode of recurrent major depressive disorder (HCC)  Anxiety disorder due to known physiological condition  Collaboration of Care: Other - none needed currently  Patient/Guardian was advised Release of Information must be obtained prior to any record release in order to collaborate their care with an outside provider. Patient/Guardian was advised if they have not already done so to contact the registration department to sign all necessary forms in order for us  to release information regarding their care.   Consent: Patient/Guardian gives verbal consent for treatment and assignment of benefits for services provided during this visit. Patient/Guardian expressed understanding and agreed to proceed.  [name], LCSW 07/29/2024

## 2024-07-30 ENCOUNTER — Other Ambulatory Visit (HOSPITAL_COMMUNITY): Payer: Self-pay

## 2024-08-12 ENCOUNTER — Other Ambulatory Visit (HOSPITAL_COMMUNITY): Payer: Self-pay

## 2024-08-13 ENCOUNTER — Encounter (HOSPITAL_COMMUNITY): Payer: Self-pay | Admitting: Clinical

## 2024-08-13 ENCOUNTER — Ambulatory Visit (HOSPITAL_COMMUNITY): Admitting: Clinical

## 2024-08-13 DIAGNOSIS — F064 Anxiety disorder due to known physiological condition: Secondary | ICD-10-CM

## 2024-08-13 DIAGNOSIS — F331 Major depressive disorder, recurrent, moderate: Secondary | ICD-10-CM | POA: Diagnosis not present

## 2024-08-13 NOTE — Progress Notes (Signed)
 THERAPIST PROGRESS NOTE  Session Time:  9:08am-9:55am  Session #10  Virtual Visit via Video Note  I connected with Nancy Reeves on 08/13/24 at  9:00 AM EDT by a video enabled telemedicine application and verified that I am speaking with the correct person using two identifiers.  Location: Patient: home Provider: home office   I discussed the limitations of evaluation and management by telemedicine and the availability of in person appointments. The patient expressed understanding and agreed to proceed.  I discussed the assessment and treatment plan with the patient. The patient was provided an opportunity to ask questions and all were answered. The patient agreed with the plan and demonstrated an understanding of the instructions.   The patient was advised to call back or seek an in-person evaluation if the symptoms worsen or if the condition fails to improve as anticipated.  I provided 47 minutes of non-face-to-face time during this encounter.    Nancy JINNY Crest, LCSW   Participation Level: Active  Behavioral Response: Casual Alert Euthymic  Type of Therapy: Individual Therapy  Treatment Goals addressed:  New treatment goals established, current goals reviewed:  STG: Score less than 9 on the PHQ-9 and less than 5 on the GAD-7 as evidenced by intermittent administration of the questionnaires to determine progress in managing depression and anxiety.    STG: Process life events to the extent needed so that will be able to move forward with various areas of life in a better frame of mind per self-report of improved satisfaction with life 5 out of 7 days over the next 6 months.  STG: Learn and practice communication techniques such as active listening, I statements, open-ended questions, reflective listening, assertiveness, fair fighting rules, initiating conversations, and more as necessary and taught in session.    LTG: Identify and decrease cognitive distortions  contributing negatively to mood and behavior by identifying 5-7 cognitive distortions that are present; learn how to come up with replacement thoughts that are more balanced, realistic, and helpful.    STG: Explore personal core beliefs, rules and assumptions, and cognitive distortions through therapist using Cognitive Behavioral Therapy; learn about Behavioral Activation and Acting As If.    STG: Learn breathing techniques and grounding techniques at an age-appropriate and ability-appropriate level and demonstrate mastery in session then report independent use of these skills out of session.    STG: Learn about boundary types, how to implement them, and how to enforce them so that feels more empowered and content with being able to maintain more helpful, appropriate boundaries in the future for a more balanced result.    STG: Work to Arts development officer from models like CBT, Stages of Change, DBT, shame resilience theory, ACT, SFBT, MI, trauma-informed therapy and others to be able to manage mental health symptoms, AEB practicing out of session and reporting back. LTG: Learn appropriate and relevant therapeutic techniques to manage physical pain and regularly use scaling methods to determine effectiveness of the strategies used.   STG: Discuss issues with insomnia and learn more about sleep hygiene; learn and practice tools until sleep is improved per self-report.     ProgressTowards Goals: Progressing  Interventions: Conservator, museum/gallery, Supportive, and Other: shame work   Summary: Nancy Reeves is a 43 y.o. female who presents with Crohn's disease and fibromyalgia who is having a lot of anxiety and presents for therapy. She presented oriented x5 and stated she was feeling tired and like crying (which she did off and on throughout the session).  CSW evaluated patient's medication compliance, use of coping tools, and self-care, as applicable.  She provided an update on various aspects of her life  that are normally discussed in therapy, including money issues since is the last month she will receive her disability check, problems within the family, and what happens when people cross her boundaries.  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  She stated she had been feeling less than and not enough but these occurrences helped validate her efforts.  We discussed how shame is linked to not enough in a lot of areas in life and CSW introduced her in brief to the shame resilience theory.  She shared a story of how her aunt put pressure on her to help her out, even after patient told her that doing so would put her own health at risk.  This made her angry so she has cut that aunt off.  She also is still not talking to her sister.  We discussed boundaries and she asserted strongly that she has no boundary issues, that when people cross her boundaries (that she has previously told them about), she becomes angry and tells them.  She has not heard anything about her ailing father, thinks no news is good news, is not really that interested because she has so much going on.  She had to leave abruptly due to Crohn's symptoms.  Suicidal/Homicidal: No without intent/plan  Therapist Response: Patient is progressing AEB engaging in scheduled therapy session.  Throughout the session, CSW gave patient the opportunity to explore thoughts and feelings associated with current life situations and past/present stressors.   CSW challenged patient gently and appropriately to consider different ways of looking at reported issues. CSW encouraged patient's expression of feelings and validated these using empathy, active listening, open body language, and unconditional positive regard.   Additional virtual sessions were scheduled.  Plan/Recommendations:  Return to therapy in 2 weeks to next scheduled  appointment on 10/10, reflect on what was discussed in session, engage in self care behaviors as explored in session, do homework as assigned (continue to consider the discussion about not enough that we had in reference to shame and how that might apply to her life), and return to next session prepared to talk about experience with new coping methods.   Diagnosis:  Moderate episode of recurrent major depressive disorder (HCC)  Anxiety disorder due to known physiological condition  Collaboration of Care: Other - none needed currently  Patient/Guardian was advised Release of Information must be obtained prior to any record release in order to collaborate their care with an outside provider. Patient/Guardian was advised if they have not already done so to contact the registration department to sign all necessary forms in order for us  to release information regarding their care.   Consent: Patient/Guardian gives verbal consent for treatment and assignment of benefits for services provided during this visit. Patient/Guardian expressed understanding and agreed to proceed.   Nancy JINNY Crest, LCSW 08/13/2024

## 2024-08-16 ENCOUNTER — Other Ambulatory Visit: Payer: Self-pay

## 2024-08-16 ENCOUNTER — Other Ambulatory Visit: Payer: Self-pay | Admitting: Pharmacy Technician

## 2024-08-16 NOTE — Progress Notes (Signed)
 Specialty Pharmacy Refill Coordination Note  Nasiya Pascual is a 43 y.o. female contacted today regarding refills of specialty medication(s) Adalimumab  (Humira  (2 Pen))   Patient requested Delivery   Delivery date: 08/17/24   Verified address: [address]  [place] [address]   Medication will be filled on 08/16/24. Injection due on 08/19/24.

## 2024-08-27 ENCOUNTER — Ambulatory Visit (HOSPITAL_COMMUNITY): Payer: Self-pay | Admitting: Clinical

## 2024-08-27 ENCOUNTER — Encounter (HOSPITAL_COMMUNITY): Payer: Self-pay

## 2024-08-27 NOTE — Progress Notes (Signed)
 Patient connected for video chat and explained that she has lost her insurance coverage.  She is in the process of trying to get new coverage and wanted to keep her remaining appointments in coming months in place.  She will call to cancel if she cannot obtain insurance.  She reported she is going to be okay, I'm not going to let this get me down.  Encounter Diagnosis  Name Primary?   Failure to attend appointment with reason given Yes       Elgie Crest, LCSW 08/27/2024, 9:13 AM

## 2024-09-08 ENCOUNTER — Other Ambulatory Visit (HOSPITAL_COMMUNITY): Payer: Self-pay

## 2024-09-15 ENCOUNTER — Other Ambulatory Visit (HOSPITAL_COMMUNITY): Payer: Self-pay

## 2024-09-15 ENCOUNTER — Other Ambulatory Visit: Payer: Self-pay

## 2024-09-16 ENCOUNTER — Other Ambulatory Visit: Payer: Self-pay

## 2024-09-16 ENCOUNTER — Other Ambulatory Visit (HOSPITAL_COMMUNITY): Payer: Self-pay

## 2024-09-16 ENCOUNTER — Telehealth: Payer: Self-pay

## 2024-09-16 NOTE — Telephone Encounter (Signed)
 Alan, when we see this fax please get it to me for signature

## 2024-09-16 NOTE — Progress Notes (Signed)
 Will start Patient Assistance forms and speak with provider. Thank you.

## 2024-09-16 NOTE — Telephone Encounter (Signed)
 Dr. Albertus has signed the form and I have faxed the forms to [facility] per Rosina at our RX PA team.

## 2024-09-17 ENCOUNTER — Other Ambulatory Visit: Payer: Self-pay

## 2024-09-20 ENCOUNTER — Other Ambulatory Visit: Payer: Self-pay

## 2024-09-22 ENCOUNTER — Other Ambulatory Visit: Payer: Self-pay

## 2024-09-28 ENCOUNTER — Ambulatory Visit (HOSPITAL_COMMUNITY): Admitting: Clinical

## 2024-09-28 ENCOUNTER — Encounter (HOSPITAL_COMMUNITY): Payer: Self-pay

## 2024-09-29 ENCOUNTER — Other Ambulatory Visit: Payer: Self-pay

## 2024-10-03 NOTE — Progress Notes (Signed)
 Spoke with patient who reported that all future appointments should be cancelled because she still does not have insurance at this time.  Front desk was alerted.  Encounter Diagnosis  Name Primary?   Failure to attend appointment with reason given Yes     Elgie Crest, LCSW 10/03/2024, 4:46 PM

## 2024-10-13 ENCOUNTER — Ambulatory Visit (HOSPITAL_COMMUNITY): Admitting: Clinical

## 2024-10-20 ENCOUNTER — Other Ambulatory Visit: Payer: Self-pay

## 2024-10-26 ENCOUNTER — Ambulatory Visit (HOSPITAL_COMMUNITY): Admitting: Clinical

## 2024-10-28 ENCOUNTER — Encounter: Payer: Self-pay | Admitting: Internal Medicine

## 2024-10-29 NOTE — Telephone Encounter (Signed)
 Cannot walk 200 feet without stopping to rest. Cannot walk without the use of, or assistance from, a brace, cane, crutch, another person, prosthetic device, wheelchair, or other assistive device. Is restricted by lung disease to such an extent that the persons forced (respiratory) expiratory volume of one second, when measured by spirometry, is less than one liter, or the arterial oxygen tension is less than 60 mm/hg on room air at rest. Uses portable oxygen. Has a cardiac condition to the extent that the person's functional limitations are classified in severity as Class III or Class IV according to standards set by the American Heart Association. Is severely limited in their ability to walk due to an arthritic, neurological, or orthopedic condition. Is totally blind or whose vision with glasses is so defective as to prevent the performance of ordinary activity for which eyesight is essential, as certified by a licensed ophthalmologist, optometrist, or the Division of Services for the Blind  Looking at the requirements, I do not see that she meets any of the approved check boxes (heart failure, lung disease, neurologic disease, blindness, etc.  She can ask her PCP as well JMP

## 2024-11-04 ENCOUNTER — Other Ambulatory Visit: Payer: Self-pay

## 2024-11-09 ENCOUNTER — Other Ambulatory Visit: Payer: Self-pay

## 2024-11-25 ENCOUNTER — Other Ambulatory Visit: Payer: Self-pay

## 2024-11-25 NOTE — Progress Notes (Signed)
 Per telephone encounter from 09/16/24, patient no longer has insurance and getting from PAP. Dis-enrolled

## 2024-12-13 ENCOUNTER — Other Ambulatory Visit: Payer: Self-pay
